# Patient Record
Sex: Female | Born: 1945
Health system: Southern US, Community
[De-identification: ages and names within clinical notes are randomized; demographics above are authoritative.]

## PROBLEM LIST (undated history)

## (undated) DIAGNOSIS — R059 Cough, unspecified: Secondary | ICD-10-CM

## (undated) DIAGNOSIS — R05 Cough: Secondary | ICD-10-CM

## (undated) DIAGNOSIS — I4729 Other ventricular tachycardia: Secondary | ICD-10-CM

## (undated) DIAGNOSIS — I472 Ventricular tachycardia, unspecified: Secondary | ICD-10-CM

## (undated) DIAGNOSIS — I2510 Atherosclerotic heart disease of native coronary artery without angina pectoris: Secondary | ICD-10-CM

## (undated) DIAGNOSIS — G4730 Sleep apnea, unspecified: Secondary | ICD-10-CM

## (undated) DIAGNOSIS — N3010 Interstitial cystitis (chronic) without hematuria: Secondary | ICD-10-CM

## (undated) DIAGNOSIS — E781 Pure hyperglyceridemia: Secondary | ICD-10-CM

## (undated) DIAGNOSIS — G4733 Obstructive sleep apnea (adult) (pediatric): Secondary | ICD-10-CM

## (undated) DIAGNOSIS — T7840XA Allergy, unspecified, initial encounter: Secondary | ICD-10-CM

## (undated) DIAGNOSIS — J45909 Unspecified asthma, uncomplicated: Secondary | ICD-10-CM

## (undated) DIAGNOSIS — K219 Gastro-esophageal reflux disease without esophagitis: Secondary | ICD-10-CM

## (undated) DIAGNOSIS — R569 Unspecified convulsions: Secondary | ICD-10-CM

## (undated) DIAGNOSIS — E039 Hypothyroidism, unspecified: Secondary | ICD-10-CM

## (undated) DIAGNOSIS — Z87898 Personal history of other specified conditions: Secondary | ICD-10-CM

## (undated) DIAGNOSIS — M1990 Unspecified osteoarthritis, unspecified site: Secondary | ICD-10-CM

## (undated) DIAGNOSIS — K589 Irritable bowel syndrome without diarrhea: Secondary | ICD-10-CM

## (undated) DIAGNOSIS — I10 Essential (primary) hypertension: Secondary | ICD-10-CM

## (undated) DIAGNOSIS — E785 Hyperlipidemia, unspecified: Secondary | ICD-10-CM

## (undated) DIAGNOSIS — E119 Type 2 diabetes mellitus without complications: Secondary | ICD-10-CM

## (undated) HISTORY — DX: Unspecified asthma, uncomplicated: J45.909

## (undated) HISTORY — DX: Personal history of other specified conditions: Z87.898

## (undated) HISTORY — PX: FACIAL NERVE SURGERY: SHX630

## (undated) HISTORY — DX: Sleep apnea, unspecified: G47.30

## (undated) HISTORY — DX: Gastro-esophageal reflux disease without esophagitis: K21.9

## (undated) HISTORY — DX: Irritable bowel syndrome, unspecified: K58.9

## (undated) HISTORY — DX: Ventricular tachycardia: I47.2

## (undated) HISTORY — DX: Cough, unspecified: R05.9

## (undated) HISTORY — DX: Ventricular tachycardia, unspecified: I47.20

## (undated) HISTORY — DX: Interstitial cystitis (chronic) without hematuria: N30.10

## (undated) HISTORY — DX: Obstructive sleep apnea (adult) (pediatric): G47.33

## (undated) HISTORY — DX: Pure hyperglyceridemia: E78.1

## (undated) HISTORY — DX: Cough: R05

## (undated) HISTORY — DX: Allergy, unspecified, initial encounter: T78.40XA

## (undated) HISTORY — DX: Type 2 diabetes mellitus without complications: E11.9

## (undated) HISTORY — PX: COSMETIC SURGERY: SHX468

## (undated) HISTORY — DX: Other ventricular tachycardia: I47.29

## (undated) HISTORY — DX: Unspecified osteoarthritis, unspecified site: M19.90

## (undated) HISTORY — DX: Hypothyroidism, unspecified: E03.9

## (undated) HISTORY — DX: Hyperlipidemia, unspecified: E78.5

## (undated) HISTORY — DX: Atherosclerotic heart disease of native coronary artery without angina pectoris: I25.10

## (undated) HISTORY — DX: Unspecified convulsions: R56.9

## (undated) HISTORY — DX: Essential (primary) hypertension: I10

---

## 1979-11-16 HISTORY — PX: ABDOMINAL HYSTERECTOMY: SHX81

## 1998-04-24 ENCOUNTER — Other Ambulatory Visit: Admission: RE | Admit: 1998-04-24 | Discharge: 1998-04-24 | Payer: Self-pay | Admitting: *Deleted

## 1999-08-04 ENCOUNTER — Encounter: Admission: RE | Admit: 1999-08-04 | Discharge: 1999-11-02 | Payer: Self-pay | Admitting: *Deleted

## 1999-10-13 ENCOUNTER — Encounter: Payer: Self-pay | Admitting: *Deleted

## 1999-10-13 ENCOUNTER — Encounter: Admission: RE | Admit: 1999-10-13 | Discharge: 1999-10-13 | Payer: Self-pay | Admitting: *Deleted

## 2000-01-31 ENCOUNTER — Encounter: Payer: Self-pay | Admitting: *Deleted

## 2000-01-31 ENCOUNTER — Encounter: Admission: RE | Admit: 2000-01-31 | Discharge: 2000-01-31 | Payer: Self-pay | Admitting: *Deleted

## 2000-07-03 ENCOUNTER — Encounter: Admission: RE | Admit: 2000-07-03 | Discharge: 2000-07-03 | Payer: Self-pay | Admitting: *Deleted

## 2000-07-03 ENCOUNTER — Encounter: Payer: Self-pay | Admitting: *Deleted

## 2000-07-18 HISTORY — PX: CHOLECYSTECTOMY: SHX55

## 2000-07-20 ENCOUNTER — Encounter: Payer: Self-pay | Admitting: General Surgery

## 2000-07-21 ENCOUNTER — Encounter (INDEPENDENT_AMBULATORY_CARE_PROVIDER_SITE_OTHER): Payer: Self-pay | Admitting: Specialist

## 2000-07-21 ENCOUNTER — Observation Stay (HOSPITAL_COMMUNITY): Admission: RE | Admit: 2000-07-21 | Discharge: 2000-07-22 | Payer: Self-pay | Admitting: General Surgery

## 2000-08-21 ENCOUNTER — Encounter: Admission: RE | Admit: 2000-08-21 | Discharge: 2000-11-19 | Payer: Self-pay | Admitting: *Deleted

## 2000-11-27 ENCOUNTER — Ambulatory Visit (HOSPITAL_COMMUNITY): Admission: RE | Admit: 2000-11-27 | Discharge: 2000-11-27 | Payer: Self-pay | Admitting: Gastroenterology

## 2001-02-14 ENCOUNTER — Encounter: Admission: RE | Admit: 2001-02-14 | Discharge: 2001-02-14 | Payer: Self-pay | Admitting: *Deleted

## 2001-02-14 ENCOUNTER — Encounter: Payer: Self-pay | Admitting: *Deleted

## 2001-09-18 ENCOUNTER — Other Ambulatory Visit: Admission: RE | Admit: 2001-09-18 | Discharge: 2001-09-18 | Payer: Self-pay | Admitting: *Deleted

## 2001-12-17 ENCOUNTER — Ambulatory Visit (HOSPITAL_COMMUNITY): Admission: RE | Admit: 2001-12-17 | Discharge: 2001-12-17 | Payer: Self-pay | Admitting: Cardiology

## 2002-02-26 ENCOUNTER — Encounter: Admission: RE | Admit: 2002-02-26 | Discharge: 2002-02-26 | Payer: Self-pay | Admitting: *Deleted

## 2002-02-26 ENCOUNTER — Encounter: Payer: Self-pay | Admitting: *Deleted

## 2002-04-05 ENCOUNTER — Ambulatory Visit (HOSPITAL_COMMUNITY): Admission: RE | Admit: 2002-04-05 | Discharge: 2002-04-05 | Payer: Self-pay | Admitting: Internal Medicine

## 2002-05-13 ENCOUNTER — Other Ambulatory Visit: Admission: RE | Admit: 2002-05-13 | Discharge: 2002-05-13 | Payer: Self-pay | Admitting: *Deleted

## 2002-06-06 ENCOUNTER — Ambulatory Visit (HOSPITAL_COMMUNITY): Admission: RE | Admit: 2002-06-06 | Discharge: 2002-06-06 | Payer: Self-pay | Admitting: *Deleted

## 2002-09-23 ENCOUNTER — Encounter: Payer: Self-pay | Admitting: Family Medicine

## 2002-09-23 ENCOUNTER — Encounter: Admission: RE | Admit: 2002-09-23 | Discharge: 2002-09-23 | Payer: Self-pay | Admitting: Family Medicine

## 2002-10-14 ENCOUNTER — Other Ambulatory Visit: Admission: RE | Admit: 2002-10-14 | Discharge: 2002-10-14 | Payer: Self-pay | Admitting: *Deleted

## 2002-10-18 ENCOUNTER — Encounter: Admission: RE | Admit: 2002-10-18 | Discharge: 2002-10-18 | Payer: Self-pay | Admitting: Family Medicine

## 2002-10-18 ENCOUNTER — Encounter: Payer: Self-pay | Admitting: Family Medicine

## 2003-03-05 ENCOUNTER — Encounter: Payer: Self-pay | Admitting: Family Medicine

## 2003-03-05 ENCOUNTER — Encounter: Admission: RE | Admit: 2003-03-05 | Discharge: 2003-03-05 | Payer: Self-pay | Admitting: Family Medicine

## 2003-03-28 ENCOUNTER — Encounter: Payer: Self-pay | Admitting: Gastroenterology

## 2003-03-28 ENCOUNTER — Encounter: Admission: RE | Admit: 2003-03-28 | Discharge: 2003-03-28 | Payer: Self-pay | Admitting: Gastroenterology

## 2003-04-02 ENCOUNTER — Ambulatory Visit (HOSPITAL_COMMUNITY): Admission: RE | Admit: 2003-04-02 | Discharge: 2003-04-02 | Payer: Self-pay | Admitting: Gastroenterology

## 2003-04-15 ENCOUNTER — Ambulatory Visit (HOSPITAL_COMMUNITY): Admission: RE | Admit: 2003-04-15 | Discharge: 2003-04-15 | Payer: Self-pay | Admitting: Gastroenterology

## 2003-04-15 ENCOUNTER — Encounter: Payer: Self-pay | Admitting: Gastroenterology

## 2003-05-08 ENCOUNTER — Ambulatory Visit (HOSPITAL_COMMUNITY): Admission: RE | Admit: 2003-05-08 | Discharge: 2003-05-08 | Payer: Self-pay | Admitting: Gastroenterology

## 2003-05-08 ENCOUNTER — Encounter: Payer: Self-pay | Admitting: Gastroenterology

## 2004-01-26 ENCOUNTER — Encounter: Payer: Self-pay | Admitting: Internal Medicine

## 2004-01-30 ENCOUNTER — Ambulatory Visit (HOSPITAL_BASED_OUTPATIENT_CLINIC_OR_DEPARTMENT_OTHER): Admission: RE | Admit: 2004-01-30 | Discharge: 2004-01-30 | Payer: Self-pay | Admitting: Pulmonary Disease

## 2004-01-30 ENCOUNTER — Encounter: Payer: Self-pay | Admitting: Pulmonary Disease

## 2004-04-16 ENCOUNTER — Encounter: Admission: RE | Admit: 2004-04-16 | Discharge: 2004-04-16 | Payer: Self-pay | Admitting: Family Medicine

## 2004-04-27 ENCOUNTER — Ambulatory Visit (HOSPITAL_COMMUNITY): Admission: RE | Admit: 2004-04-27 | Discharge: 2004-04-27 | Payer: Self-pay | Admitting: Gastroenterology

## 2004-04-30 ENCOUNTER — Ambulatory Visit (HOSPITAL_COMMUNITY): Admission: RE | Admit: 2004-04-30 | Discharge: 2004-04-30 | Payer: Self-pay | Admitting: Gastroenterology

## 2004-05-18 ENCOUNTER — Ambulatory Visit (HOSPITAL_COMMUNITY): Admission: RE | Admit: 2004-05-18 | Discharge: 2004-05-18 | Payer: Self-pay

## 2004-06-30 ENCOUNTER — Ambulatory Visit: Payer: Self-pay | Admitting: Pulmonary Disease

## 2004-07-07 ENCOUNTER — Ambulatory Visit (HOSPITAL_COMMUNITY): Admission: RE | Admit: 2004-07-07 | Discharge: 2004-07-07 | Payer: Self-pay | Admitting: Gastroenterology

## 2004-07-27 ENCOUNTER — Ambulatory Visit (HOSPITAL_COMMUNITY): Admission: RE | Admit: 2004-07-27 | Discharge: 2004-07-27 | Payer: Self-pay | Admitting: *Deleted

## 2004-10-18 ENCOUNTER — Encounter: Admission: RE | Admit: 2004-10-18 | Discharge: 2005-01-16 | Payer: Self-pay | Admitting: Family Medicine

## 2005-01-29 ENCOUNTER — Encounter: Admission: RE | Admit: 2005-01-29 | Discharge: 2005-01-29 | Payer: Self-pay | Admitting: Family Medicine

## 2005-04-07 ENCOUNTER — Encounter: Admission: RE | Admit: 2005-04-07 | Discharge: 2005-07-06 | Payer: Self-pay | Admitting: Family Medicine

## 2005-04-20 ENCOUNTER — Ambulatory Visit (HOSPITAL_COMMUNITY): Admission: RE | Admit: 2005-04-20 | Discharge: 2005-04-20 | Payer: Self-pay | Admitting: Family Medicine

## 2005-07-18 DIAGNOSIS — Z87898 Personal history of other specified conditions: Secondary | ICD-10-CM

## 2005-07-18 HISTORY — DX: Personal history of other specified conditions: Z87.898

## 2005-09-23 ENCOUNTER — Ambulatory Visit (HOSPITAL_COMMUNITY): Admission: RE | Admit: 2005-09-23 | Discharge: 2005-09-23 | Payer: Self-pay | Admitting: *Deleted

## 2005-12-15 ENCOUNTER — Encounter: Admission: RE | Admit: 2005-12-15 | Discharge: 2005-12-15 | Payer: Self-pay | Admitting: Family Medicine

## 2005-12-16 ENCOUNTER — Encounter: Admission: RE | Admit: 2005-12-16 | Discharge: 2005-12-16 | Payer: Self-pay | Admitting: Family Medicine

## 2006-05-16 ENCOUNTER — Encounter: Admission: RE | Admit: 2006-05-16 | Discharge: 2006-05-16 | Payer: Self-pay | Admitting: Family Medicine

## 2006-10-05 ENCOUNTER — Ambulatory Visit (HOSPITAL_BASED_OUTPATIENT_CLINIC_OR_DEPARTMENT_OTHER): Admission: RE | Admit: 2006-10-05 | Discharge: 2006-10-05 | Payer: Self-pay | Admitting: Urology

## 2007-05-07 ENCOUNTER — Ambulatory Visit: Payer: Self-pay | Admitting: Pulmonary Disease

## 2007-05-21 ENCOUNTER — Encounter: Admission: RE | Admit: 2007-05-21 | Discharge: 2007-05-21 | Payer: Self-pay | Admitting: Family Medicine

## 2007-05-25 ENCOUNTER — Ambulatory Visit: Payer: Self-pay | Admitting: Pulmonary Disease

## 2007-06-05 DIAGNOSIS — G4733 Obstructive sleep apnea (adult) (pediatric): Secondary | ICD-10-CM | POA: Insufficient documentation

## 2007-06-05 DIAGNOSIS — T7840XA Allergy, unspecified, initial encounter: Secondary | ICD-10-CM | POA: Insufficient documentation

## 2007-06-05 DIAGNOSIS — E1159 Type 2 diabetes mellitus with other circulatory complications: Secondary | ICD-10-CM | POA: Insufficient documentation

## 2007-06-05 DIAGNOSIS — I472 Ventricular tachycardia: Secondary | ICD-10-CM | POA: Insufficient documentation

## 2007-06-05 DIAGNOSIS — K589 Irritable bowel syndrome without diarrhea: Secondary | ICD-10-CM | POA: Insufficient documentation

## 2007-06-05 DIAGNOSIS — I4729 Other ventricular tachycardia: Secondary | ICD-10-CM | POA: Insufficient documentation

## 2007-06-05 DIAGNOSIS — E039 Hypothyroidism, unspecified: Secondary | ICD-10-CM | POA: Insufficient documentation

## 2007-06-05 DIAGNOSIS — E119 Type 2 diabetes mellitus without complications: Secondary | ICD-10-CM | POA: Insufficient documentation

## 2007-06-05 DIAGNOSIS — J45909 Unspecified asthma, uncomplicated: Secondary | ICD-10-CM | POA: Insufficient documentation

## 2007-06-05 DIAGNOSIS — K219 Gastro-esophageal reflux disease without esophagitis: Secondary | ICD-10-CM | POA: Insufficient documentation

## 2007-06-05 DIAGNOSIS — N3010 Interstitial cystitis (chronic) without hematuria: Secondary | ICD-10-CM | POA: Insufficient documentation

## 2007-07-02 ENCOUNTER — Ambulatory Visit: Payer: Self-pay | Admitting: Pulmonary Disease

## 2007-07-02 DIAGNOSIS — R059 Cough, unspecified: Secondary | ICD-10-CM | POA: Insufficient documentation

## 2007-07-02 DIAGNOSIS — R05 Cough: Secondary | ICD-10-CM

## 2007-07-16 ENCOUNTER — Encounter: Payer: Self-pay | Admitting: Pulmonary Disease

## 2007-11-08 ENCOUNTER — Ambulatory Visit: Payer: Self-pay | Admitting: Pulmonary Disease

## 2007-11-08 DIAGNOSIS — J383 Other diseases of vocal cords: Secondary | ICD-10-CM | POA: Insufficient documentation

## 2007-12-17 ENCOUNTER — Encounter: Payer: Self-pay | Admitting: Pulmonary Disease

## 2008-01-09 ENCOUNTER — Encounter: Admission: RE | Admit: 2008-01-09 | Discharge: 2008-01-09 | Payer: Self-pay | Admitting: Family Medicine

## 2008-01-10 ENCOUNTER — Encounter: Payer: Self-pay | Admitting: Pulmonary Disease

## 2008-01-15 ENCOUNTER — Telehealth (INDEPENDENT_AMBULATORY_CARE_PROVIDER_SITE_OTHER): Payer: Self-pay | Admitting: *Deleted

## 2008-01-31 ENCOUNTER — Encounter: Payer: Self-pay | Admitting: Pulmonary Disease

## 2008-04-14 ENCOUNTER — Encounter: Payer: Self-pay | Admitting: Pulmonary Disease

## 2008-05-22 ENCOUNTER — Encounter: Admission: RE | Admit: 2008-05-22 | Discharge: 2008-05-22 | Payer: Self-pay | Admitting: Family Medicine

## 2008-07-18 HISTORY — PX: ESOPHAGOGASTRODUODENOSCOPY ENDOSCOPY: SHX5814

## 2008-08-21 ENCOUNTER — Encounter: Payer: Self-pay | Admitting: Pulmonary Disease

## 2009-07-22 ENCOUNTER — Encounter: Admission: RE | Admit: 2009-07-22 | Discharge: 2009-07-22 | Payer: Self-pay | Admitting: Family Medicine

## 2009-09-10 ENCOUNTER — Encounter: Payer: Self-pay | Admitting: Pulmonary Disease

## 2010-08-17 NOTE — Letter (Signed)
Summary: [facility]  [id number]   Imported By: Lanelle Bal 10/12/2009 14:16:10  _____________________________________________________________________  External Attachment:    Type:   Image     Comment:   External Document

## 2010-09-21 ENCOUNTER — Encounter: Payer: Self-pay | Admitting: Family Medicine

## 2010-10-07 ENCOUNTER — Other Ambulatory Visit: Payer: Self-pay | Admitting: Family Medicine

## 2010-10-07 DIAGNOSIS — Z1231 Encounter for screening mammogram for malignant neoplasm of breast: Secondary | ICD-10-CM

## 2010-10-11 ENCOUNTER — Ambulatory Visit
Admission: RE | Admit: 2010-10-11 | Discharge: 2010-10-11 | Disposition: A | Discharge status: Home / Self Care | Payer: BC Managed Care – PPO | Source: Ambulatory Visit | Attending: Family Medicine | Admitting: Family Medicine

## 2010-10-11 DIAGNOSIS — Z1231 Encounter for screening mammogram for malignant neoplasm of breast: Secondary | ICD-10-CM

## 2010-10-15 ENCOUNTER — Ambulatory Visit: Payer: Self-pay | Admitting: Family Medicine

## 2010-10-21 LAB — LIPID PANEL
Cholesterol: 154 mg/dL (ref 0–200)
HDL: 64 mg/dL (ref 35–70)
Triglycerides: 171 mg/dL — AB (ref 40–160)

## 2010-10-21 LAB — BASIC METABOLIC PANEL: Creatinine: 1 mg/dL (ref 0.5–1.1)

## 2010-10-21 LAB — TSH: TSH: 3.38 u[IU]/mL (ref 0.41–5.90)

## 2010-10-21 LAB — HEPATIC FUNCTION PANEL
ALT: 29 U/L (ref 7–35)
Alkaline Phosphatase: 55 U/L (ref 25–125)

## 2010-11-08 ENCOUNTER — Encounter: Payer: Self-pay | Admitting: Family Medicine

## 2010-11-08 ENCOUNTER — Ambulatory Visit (INDEPENDENT_AMBULATORY_CARE_PROVIDER_SITE_OTHER): Payer: BC Managed Care – PPO | Admitting: Family Medicine

## 2010-11-08 ENCOUNTER — Encounter: Payer: Self-pay | Admitting: *Deleted

## 2010-11-08 VITALS — BP 130/80 | HR 72 | Temp 97.9°F | Ht 63.25 in | Wt 145.8 lb

## 2010-11-08 DIAGNOSIS — E039 Hypothyroidism, unspecified: Secondary | ICD-10-CM

## 2010-11-08 DIAGNOSIS — I4729 Other ventricular tachycardia: Secondary | ICD-10-CM

## 2010-11-08 DIAGNOSIS — I472 Ventricular tachycardia: Secondary | ICD-10-CM

## 2010-11-08 DIAGNOSIS — J383 Other diseases of vocal cords: Secondary | ICD-10-CM | POA: Insufficient documentation

## 2010-11-08 DIAGNOSIS — E119 Type 2 diabetes mellitus without complications: Secondary | ICD-10-CM

## 2010-11-08 DIAGNOSIS — J385 Laryngeal spasm: Secondary | ICD-10-CM | POA: Insufficient documentation

## 2010-11-08 DIAGNOSIS — E7800 Pure hypercholesterolemia, unspecified: Secondary | ICD-10-CM

## 2010-11-08 MED ORDER — ESTRADIOL 0.1 MG/GM VA CREA: 1.0000 g | TOPICAL_CREAM | VAGINAL | Status: DC

## 2010-11-08 NOTE — Patient Instructions (Signed)
I was nice meeting you today.  Don't forget to stop by front desk to drop of complete medical records requests.

## 2010-11-08 NOTE — Progress Notes (Signed)
Subjective:    Patient ID: Valerie Vargas, female    DOB: 1946/02/24, 65 y.o.   MRN: [id number]  HPI  65 year old female here to establish.  Previous MD Dr. Raquel James.  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  She was very healthy until 2001... fatigue, SOB, palpitations, syncope, chocking. Saw Pulmonologist, Cardiologist. Dx Asthma, GERD.. But meds did not help. Stress test showed tachycardia... Started on toprol. Saw Dr Marcos Eke...dx then with sleep apnea. Improved some. Saw alternative med DR.: Dr. Ananias Pilgrim. Dx with vit D and other vit deficiency... Felt much better.  Also referred to [facility]  (Dr. Carlye Grippe dx with larynx swelling....coughing proceeds attacks. She is on gabapentin to prevents this... Can take more if having an episode. Last episode 1 week ago... But usually every few months. Unknown trigger.     DM: Last A1C was  6.1 on 4/5.  Followed by Dr. Talmage Nap.  Last check there was 5.7 On metformin... Told she was doing so well she was able to try to taper down off metformin.  Last cholesterol 4/5: total 154, HDL 64, LDL 54, tri 171.  On crestor 10 mg daily. No myalgia.  Has Dr. Royann Shivers cardiologist.  Sleep apnea:on CPAP.  Hypothyroid, well controlled: Followed by Dr. Talmage Nap.   Interstitial cystitis well controlled: Dr. Perley Jain. Atrophic vaginitis...needs refill of estrace cream.   LAST CPX: 02/17/2010    Review of Systems  Constitutional: Negative for fever, fatigue and unexpected weight change.  HENT: Negative for ear pain, congestion, sore throat, sneezing, trouble swallowing and sinus pressure.   Eyes: Negative for pain and itching.  Respiratory: Negative for cough, shortness of breath and wheezing.   Cardiovascular: Negative for chest pain, palpitations and leg swelling.  Gastrointestinal: Negative for nausea, abdominal pain, diarrhea, constipation and blood in stool.  Genitourinary: Negative for dysuria, hematuria, vaginal  discharge, difficulty urinating and menstrual problem.  Skin: Negative for rash.  Neurological: Negative for syncope, weakness, light-headedness, numbness and headaches.  Psychiatric/Behavioral: Negative for confusion and dysphoric mood. The patient is not nervous/anxious.        Objective:   Physical Exam  Constitutional: Vital signs are normal. She appears well-developed and well-nourished. She is cooperative.  Non-toxic appearance. She does not appear ill. No distress.  HENT:  Head: Normocephalic.  Right Ear: Hearing, tympanic membrane, external ear and ear canal normal.  Left Ear: Hearing, tympanic membrane, external ear and ear canal normal.  Nose: Nose normal.  Eyes: Conjunctivae, EOM and lids are normal. Pupils are equal, round, and reactive to light. No foreign bodies found.  Neck: Trachea normal and normal range of motion. Neck supple. Carotid bruit is not present. No mass and no thyromegaly present.  Cardiovascular: Normal rate, regular rhythm, S1 normal, S2 normal, normal heart sounds and intact distal pulses.  Exam reveals no gallop.   No murmur heard. Pulmonary/Chest: Effort normal and breath sounds normal. No respiratory distress. She has no wheezes. She has no rhonchi. She has no rales.  Abdominal: Soft. Normal appearance and bowel sounds are normal. She exhibits no distension, no fluid wave, no abdominal bruit and no mass. There is no hepatosplenomegaly. There is no tenderness. There is no rebound, no guarding and no CVA tenderness. No hernia.  Genitourinary: Pelvic exam was performed with patient prone. Uterus is not enlarged and not tender. Cervix exhibits no discharge and no friability. Right adnexum displays no mass, no tenderness and no fullness. Left adnexum displays no mass, no tenderness and  no fullness.  Lymphadenopathy:    She has no cervical adenopathy.    She has no axillary adenopathy.  Neurological: She is alert. She has normal strength. No cranial nerve deficit  or sensory deficit.  Skin: Skin is warm, dry and intact. No rash noted.  Psychiatric: Her speech is normal and behavior is normal. Judgment normal. Her mood appears not anxious. Cognition and memory are normal. She does not exhibit a depressed mood.          Assessment & Plan:

## 2010-11-10 ENCOUNTER — Encounter: Payer: Self-pay | Admitting: Family Medicine

## 2010-11-17 DIAGNOSIS — E1169 Type 2 diabetes mellitus with other specified complication: Secondary | ICD-10-CM | POA: Insufficient documentation

## 2010-11-17 DIAGNOSIS — E7800 Pure hypercholesterolemia, unspecified: Secondary | ICD-10-CM | POA: Insufficient documentation

## 2010-11-17 NOTE — Assessment & Plan Note (Signed)
Stable per Dr. Talmage Nap.

## 2010-11-17 NOTE — Assessment & Plan Note (Signed)
Last A1C was 6.1 on 4/5.  Followed by Dr. Talmage Nap. Last check there was 5.7  On metformin... Told she was doing so well she was able to try to taper down off metformin..she has not done this yet.

## 2010-11-17 NOTE — Assessment & Plan Note (Signed)
Followed by cardiology 

## 2010-11-17 NOTE — Assessment & Plan Note (Signed)
Due to vocal corddysfunction. Followed at [facility]. Has upcoming ? Endo study to reevaluate. She is going to discuss with her other doctors whether allergy treatment or testing is needed. Will let me know.

## 2010-11-17 NOTE — Assessment & Plan Note (Signed)
Last cholesterol 4/5: total 154, HDL 64, LDL 54, tri 171. On crestor 10 mg daily. No myalgia.

## 2010-11-23 ENCOUNTER — Encounter: Payer: Self-pay | Admitting: Family Medicine

## 2010-11-29 ENCOUNTER — Encounter: Payer: Self-pay | Admitting: Family Medicine

## 2010-11-30 ENCOUNTER — Encounter: Payer: Self-pay | Admitting: Family Medicine

## 2010-12-03 NOTE — Procedures (Signed)
[facility]  Patient:    Valerie Vargas, Valerie Vargas                      MRN: [id number] Proc. Date: 11/27/00 Adm. Date:  [id number] Attending:  Louie Bun CC:         Heather Roberts, M.D.   Procedure Report  PROCEDURE:  Colonoscopy.  GASTROENTEROLOGIST:  Everardo All. Madilyn Fireman, M.D.  INDICATIONS FOR PROCEDURE:  Right lower quadrant abdominal pain, weight loss, and alternating constipation and diarrhea in a 65 year old patient with no prior colon screening.  DESCRIPTION OF PROCEDURE:   The patient was placed in the left lateral decubitus position and placed on the pulse monitor with continuous low-flow oxygen delivered by nasal cannula.  She was sedated with 50 mg IV Demerol and 6 mg IV Versed.  The Olympus video colonoscope was inserted into the rectum and advanced to the cecum, confirmed by transillumination of McBurneys point and visualization of the ileocecal valve and appendiceal orifice.  The prep was excellent.  The cecum, ascending, transverse, descending, and sigmoid colon all appeared normal with no masses, polyps, diverticula, or other mucosal abnormalities.  The rectum likewise appeared normal on retroflexed view.  The anus revealed small internal hemorrhoids.  The colonoscope was then withdrawn, and the patient returned to the recovery room in stable condition. She tolerated the procedure well, and there were no immediate complications.  IMPRESSIONS:  Internal hemorrhoids, otherwise normal colonoscopy.  PLAN:  Consider trial of fiber supplement and, if pain continues, an antispasmodic or review if she has had recent gynecologic exam. DD:  11/27/00 TD:  11/27/00 Job: [id number]

## 2010-12-03 NOTE — Op Note (Signed)
   NAME:  Valerie Vargas, Valerie Vargas                         ACCOUNT NO.:  [id number]   MEDICAL RECORD NO.:  [id number]                   PATIENT TYPE:  AMB   LOCATION:  ENDO                                 FACILITY:  [facility]   PHYSICIAN:  Anselmo Rod, M.D.               DATE OF BIRTH:  12-Jan-1946   DATE OF PROCEDURE:  04/02/2003  DATE OF DISCHARGE:                                 OPERATIVE REPORT   PROCEDURE PERFORMED:  Esophagogastroduodenoscopy.   ENDOSCOPIST:  Anselmo Rod, M.D.   INSTRUMENT USED:  Olympus video panendoscope.   INDICATION FOR PROCEDURE:  [age]-year-old white female with a history  of epigastric pain and reflux and a family history of stomach cancer,  undergoing an EGD to rule out peptic ulcer disease, esophagitis, gastritis,  etc.   PRE-PROCEDURE PREPARATION:  Informed consent was procured from the patient.  The patient was fasted for eight hours prior to the procedure.   PRE-PROCEDURE PHYSICAL:  VITAL SIGNS:  The patient had stable vital signs.  NECK:  Neck supple.  CHEST:  Chest clear to auscultation.  S1 and S2 regular.  ABDOMEN:  Abdomen soft with normal bowel sounds.   DESCRIPTION OF THE PROCEDURE:  The patient was placed in the left lateral  decubitus position and sedated with 50 mg of Demerol and 5 mg of Versed  intravenously.  Once the patient was adequately sedate and maintained on low-  flow oxygen and continuous cardiac monitoring, the Olympus video  panendoscope was advanced through the mouthpiece, over the tongue, into the  esophagus under direct vision.  The entire esophagus appeared normal with no  evidence of ring, stricture, mass, esophagitis or Barrett's mucosa.  The  scope was then advanced into the stomach; the entire gastric mucosa appeared  normal and so did the retroflexed view in the high cardia.  The proximal  small bowel was normal as well.   IMPRESSION:  Normal esophagogastroduodenoscopy.   RECOMMENDATIONS:  1. Continue  Protonix.  2.     Follow antireflux measures.  3. Avoid nonsteroidals.  4. Outpatient followup in the next two weeks or earlier if need be.                                               Anselmo Rod, M.D.    JNM/MEDQ  D:  04/02/2003  T:  04/02/2003  Job:  [id number]   cc:   Christella Noa, M.D.  [address]  [place], [place] [address]  Fax: [phone]

## 2010-12-03 NOTE — Cardiovascular Report (Signed)
[name], Valerie Vargas               ACCOUNT NO.:  [id number]   MEDICAL RECORD NO.:  [id number]          PATIENT TYPE:  OIB   LOCATION:  2899                         FACILITY:  [facility]   PHYSICIAN:  Darlin Priestly, MD  DATE OF BIRTH:  1946-07-06   DATE OF PROCEDURE:  09/23/2005  DATE OF DISCHARGE:  09/23/2005                              CARDIAC CATHETERIZATION   PROCEDURE:  Tilt table test.   [name] is a 65 year old female with a  history of recurrent presyncope  and syncopal episodes. These have occurred in the setting of possible  orthostatic changes. She is now referred for tilt table testing to rule out  neurocardiogenic syncope.   DESCRIPTION OF PROCEDURE:  After giving informed consent the patient is  brought to the cardiac cath lab in a fasting state. The patient was then  placed in a supine position and hemodynamic measurements were obtained.  Resting  blood pressure was 144/64 with a resting heart rate of 77. She was  monitored for approximately 5 minutes. She was then tilted to a 70-degree  heads-up position which was maintained for 45 minutes. The patient had  intermittent sensation of feeling tired and feeling like she had presyncopal  symptoms, however she had no significant change in her heart rate or blood  pressure. After approximately 50 minutes she was returned to a supine  position with no significant change in her blood pressure or heart rate. She  had no syncopal episodes. At this point, the test was terminated. The  patient was then transferred to the recovery room in stable condition.   CONCLUSION:  Negative heads-up tilt table testing.      Darlin Priestly, MD  Electronically Signed     RHM/MEDQ  D:  09/23/2005  T:  09/23/2005  Job:  [id number]

## 2010-12-03 NOTE — Op Note (Signed)
NAME:  Valerie Vargas, Valerie Vargas               ACCOUNT NO.:  [id number]   MEDICAL RECORD NO.:  [id number]          PATIENT TYPE:  AMB   LOCATION:  NESC                         FACILITY:  [facility]   PHYSICIAN:  Martina Sinner, MD DATE OF BIRTH:  Jun 16, 1946   DATE OF PROCEDURE:  10/05/2006  DATE OF DISCHARGE:                               OPERATIVE REPORT   PREOP DIAGNOSIS:  Pelvic pain.   POSTOP DIAGNOSIS:  Pelvic pain with urinary frequency; interstitial  cystitis.   NAME OF SURGERY:  Cystoscopy with bladder hydrodistention and bladder  installation therapy.   [name] has frequency, right lower quadrant pain and urodynamics  that are suspicious for a diagnosis of interstitial cystitis.   The patient is prepped and draped in the usual fashion.  I used a 78-  [place] scope for the examination.  The bladder mucosa and trigone were  normal.  There is no stitch, foreign body or carcinoma.  I  hydrodistended her to approximately 900 mL.  On re-examination of the  bladder there was glomerulations at 5 and 7 o'clock and some cephalad to  the trigone.  There is no ulcers.  The findings were mild though I do  believe that her bladder is inflamed and I will treat her as a low grade  interstitial cystitis with overactive bladder syndrome.   The patient was taken to the recovery room.  She was given preoperative  antibiotics.  We will start her education and treatment next week.           ______________________________  Martina Sinner, MD  Electronically Signed     SAM/MEDQ  D:  10/05/2006  T:  10/05/2006  Job:  [id number]

## 2010-12-03 NOTE — Op Note (Signed)
NAME:  Valerie Vargas, Valerie Vargas               ACCOUNT NO.:  [id number]   MEDICAL RECORD NO.:  [id number]          PATIENT TYPE:  AMB   LOCATION:  ENDO                         FACILITY:  [facility]   PHYSICIAN:  John C. Madilyn Fireman, M.D.    DATE OF BIRTH:  [birth year]   DATE OF PROCEDURE:  04/27/2004  DATE OF DISCHARGE:                                 OPERATIVE REPORT   PROCEDURE:  Colonoscopy.   INDICATIONS FOR PROCEDURE:  Right lower quadrant abdominal pain with  abnormal CT scan showing a 1.8 cm ascending colon mass.   PROCEDURE:   PROCEDURE:  The patient was placed in the left lateral decubitus position  and placed on the pulse monitor with continuous low flow oxygen delivered by  nasal cannula.  She was sedated with 62.5 mcg IV fentanyl and 6 mg IV  Versed.  The Olympus video colonoscope is inserted into the rectum and  advanced to the cecum, confirmed by transillumination at McBurney's point  and visualization of the ileocecal valve and appendiceal orifice.  Prep is  excellent.  The terminal ileum was intubated and explored for several  centimeters and appeared to be within normal limits.  The cecum appeared  normal.  Within the ascending colon, there was a smooth thecal polyp of  approximately 2 cm in diameter, which was freely mobile after lavage with  water.  No other abnormalities were seen in the ascending colon.  The  transverse, descending colon, sigmoid, and rectum likewise appeared normal  with no masses, polyps, diverticula, or other abnormalities.  The rectum  appeared normal, and retroflexed view of the anus did reveal some small  internal hemorrhoids.  The scope was then withdrawn, and the patient  returned to the recovery room in stable condition.  She tolerated the  procedure well, and there were no immediate complications.   IMPRESSION:  No ascending colon mass and generally normal colonoscopy with  the exception of small internal hemorrhoids.   PLAN:  Due to her continued  right-sided abdominal pain, we will obtain a  small bowel series to complete her workup.   PLAN:  Repeat study in five years.     JCH/MEDQ  D:  04/27/2004  T:  04/27/2004  Job:  [id number]   cc:   Talmadge Coventry, M.D.  [address]  [place]  [place] [address]  Fax: [phone]

## 2010-12-03 NOTE — H&P (Signed)
[place]  Patient:    [name], Vargas Visit Number: [id number] MRN: [id number]          Service Type: CAT Location: [facility] 2899 12 Attending Physician:  [place], Peter Manning Dictated by:   Peter M. [place], M.D. Admit Date:  12/17/2001 Discharge Date: 12/17/2001   CC:         Heather Roberts, M.D.   History and Physical  DATE OF BIRTH: [birth year]  HISTORY OF PRESENT ILLNESS: [name] is a 65 year old white female, seen for evaluation of chest pain.  Her pain is predominantly mid chest, radiating to the mid thoracic back area.  This is described as a tightness or pressure. Her symptoms are worse when she first awakens in the morning, lasting three to four hours, and then will tend to resolve later in the day.  Her symptoms are worse with exertion.  She also has experienced symptoms of increased shortness of breath, which initially she felt was more allergy related.  She also complains of lack of stamina with exertion.  The patient does have a history of non-insulin dependent diabetes mellitus and hyperlipidemia.  On Dec 04, 2001 the patient underwent a stress Cardiolite study.  She was able to walk for five minutes and 30 seconds on the Bruce protocol.  She denied any chest pain at this point; however, she developed recurrent repetitive episodes of nonsustained ventricular tachycardia.  This tachycardia did resolve with recovery.  Her Cardiolite images were normal, with no significant perfusion defects and ejection fraction estimated at 65%.  At this point, however, the patient does have persistent symptomatology.  On further review of her history she also reports three syncopal episodes over the past year.  Given these factors and the fact that she has exercise-induced ventricular tachycardia it is recommended that she undergo cardiac catheterization.  PAST MEDICAL HISTORY:  1. Dyslipidemia.  2. Gastroesophageal reflux disease.  3. Seasonal allergies.  4. Diabetes mellitus, adult onset.  5. Hypothyroidism.  PAST SURGICAL HISTORY:  1. Previous cholecystectomy.  2. Previous hysterectomy.  ALLERGIES:  1. PENICILLIN.  2. CODEINE.  CURRENT MEDICATIONS:  1. Synthroid 0.088 mg q.d.  2. Premarin 0.625 mg q.d.  3. Albuterol inhaler q.d.  4. Rhinocort q.d.  5. Have also added baby aspirin q.d.  6. Toprol-XL 25 mg q.d.  SOCIAL HISTORY: The patient works in Clinical biochemist at [employer]. She is a widow.  She has no children.  She walks regularly.  She denies alcohol or tobacco use.  FAMILY HISTORY: Father died at age 22 with stomach cancer.  He also had a history of coronary disease, hypertension, and diabetes.  Mother is age 6 and in good health.  One brother died at age 36 of leukemia.  REVIEW OF SYSTEMS: Significant for some intermittent diarrhea.  The patient generally has not felt well recently.  She denies any weight loss, has had no edema, orthopnea, or PND.  All other Review Of Systems are negative.  PHYSICAL EXAMINATION:  GENERAL: The patient is a pleasant white female in no apparent distress.  VITAL SIGNS: Blood pressure is 118/56, pulse 86 and regular.  Weight is 140 pounds.  HEENT: Unremarkable.  NECK: She has no JVD or bruits.  No adenopathy or thyromegaly.  LUNGS: Clear.  CARDIAC: Examination is without gallops, murmurs, rubs, or clicks.  ABDOMEN: Soft, nontender.  She has no hepatosplenomegaly, masses, or bruits.  EXTREMITIES: Femoral and pedal pulses are 2+ and symmetric.  She has no  edema.  NEUROLOGIC: Examination nonfocal.  LABORATORY DATA: Resting ECG shows normal sinus rhythm, normal ECG.  Chest x-ray showed no active disease.  IMPRESSION:  1. Symptoms of chest pain and fatigue.  2. Exercise-induced ventricular tachycardia.  3. History of syncope.  4. Dyslipidemia.  5. Adult onset diabetes mellitus.  6. Hypothyroidism.  PLAN: The patient is being admitted  for cardiac catheterization.  She was initiated on beta-blocker therapy for her ventricular tachycardia.  If her cardiac catheterization is negative may need to consider referral for EP evaluation. Dictated by:   Peter M. [place], M.D. Attending Physician:  [place], Peter Manning DD:  12/14/01 TD:  12/15/01 Job: [id number]

## 2010-12-03 NOTE — Procedures (Signed)
NAME:  Valerie Vargas, Valerie Vargas             ACCOUNT NO.:  [id number]   MEDICAL RECORD NO.:  [id number]          PATIENT TYPE:  OUT   LOCATION:  SLEEP CENTER                 FACILITY:  [facility]   PHYSICIAN:  Marcelyn Bruins, M.D. [facility] DATE OF BIRTH:  08-08-1945   DATE OF ADMISSION:  01/30/2004  DATE OF DISCHARGE:  01/30/2004                              NOCTURNAL POLYSOMNOGRAM   LOCATION:  Sleep Lab.   REFERRING PHYSICIAN:  Danice Goltz, M.D. [place]   INDICATIONS FOR THE STUDY:  Hypersomnia with sleep apnea.   SLEEP ARCHITECTURE:  Total sleep time was 418 minutes with adequate REM and  slow-wave sleep.  Sleep latency was normal at 11 minutes, and REM latency  was normal as well at 69 minutes.   IMPRESSION:  1. Mild obstructive sleep apnea, hypopnea syndrome, with mild O2     desaturation to 87%.  Events were more common in the supine position but     not limb related.  2. Loud snoring noted during the study.  3. Occasional PVC.  4. Moderate numbers of leg jerks with mild sleep disruption.                                   ______________________________                                Marcelyn Bruins, M.D. [facility]     KC/MEDQ  D:  02/03/2004 10:20:31  T:  02/03/2004 14:22:56  Job:  [id number]

## 2010-12-03 NOTE — Op Note (Signed)
[facility]  Patient:    Valerie Vargas                         MRN: [id number] Proc. Date: 07/21/00 Attending:  Chevis Pretty, M.D.                           Operative Report  PREOPERATIVE DIAGNOSIS:  Symptomatic cholelithiasis.  POSTOPERATIVE DIAGNOSIS:  Symptomatic cholelithiasis.  PROCEDURES:  Laparoscopic cholecystectomy.  SURGEON:  Chevis Pretty, M.D.  ASSISTANT:  Velora Heckler, M.D.  ANESTHESIA:  General endotracheal.  DESCRIPTION OF PROCEDURE:  After informed consent was obtained, the patient was brought to the operating room and placed in the supine position on the operating table.  After adequate induction of general anesthesia, the patients abdomen was prepped with Betadine and draped in the usual sterile manner.  A small transverse supraumbilical incision was made with a 15 blade knife.  This incision was carried down through the subcutaneous tissues using blunt dissection with the Kelly clamp and Army-Navy retractors until the linea alba was identified.  The linea alba was incised with the 15 blade knife and each side was grasped with Kocher clamps and elevated.  The preperitoneal space was then probed bluntly with the Kelly clamp until the peritoneum was bluntly opened, gaining access to the abdominal cavity.  A finger was inserted through this hole.  The anterior abdominal wall was palpated and there were no adhesions noted.  A 0 Vicryl pursestring stitch was then placed in the fascia around this hole.  A Hasson cannula was then placed through this hole into the abdominal cavity and anchored with the previously placed Vicryl pursestring stitch.  The abdomen was insufflated with carbon dioxide and a laparoscope was placed through the Hasson cannula and the dome of the gallbladder and liver edge were readily identifiable.  A small transverse upper midline incision was then made with a 15 blade knife.  Then a 10 mm port was placed through  this incision bluntly into the abdominal cavity under direct vision.  Two small incisions were made laterally on the right side of the abdomen below the costal angle, I believe at the costal margin, with a 15 blade knife and two 5 mm ports were placed through these incisions bluntly into the abdominal cavity under direct vision.  The blunt grafts were placed through the lateral most 5 mm port and used to grasp the dome of the gallbladder and elevate it anteriorly and superiorly over the liver edge.  Another blunt grasper was placed through the other lateral 5 mm port and used for retraction on the gallbladder neck.  A Maryland dissector was then placed through the upper midline port.  Using blunt dissection, the peritoneal resection overlying the gallbladder was opened and the fatty tissue cleared away.  The gallbladder neck/cystic duct junction was readily identified and this area was dissected bluntly circumferentially with the Maryland dissection and right angle dissector.  Once this area had been adequately visualized and identified, care was taken to keep the common duct medial to this dissection.  Three clips were then placed proximally on the cystic duct and one distally in the cystic duct, divided between the two.  The cystic artery was then readily identified posterior to this stricture and again dissected in a circumferential manner until it was easily identified and visualized.  Two clips were placed proximally and one distally on the cystic artery  and the artery was divided between the two with the laparoscopic scissors.  The gallbladder was then removed from the liver bed using sharp dissection with the laparoscopic hook electrocautery Bovie.  Prior to completely removing the gallbladder from the liver bed, the liver bed was inspected and several small bleeding points were coagulated with the Bovie electrocautery until the liver bed was hemostatic. The gallbladder was then  removed the rest of the way from the liver bed using the hook electrocautery.  A small hole was made in the gallbladder wall in the process of removing it from the liver bed.  At this point, the laparoscope was moved to the upper midline port and an endoscopic bag was placed through the Hasson cannula.  The gallbladder was placed within the bag.  The bag opening was then closed.  The gallbladder was removed with the bag through the umbilical port.  The Hasson cannula was then replaced through the umbilical hole and the abdomen was inspected.  The liver bed was evaluated and found to be hemostatic.  The abdomen was then irrigated with copious amounts of saline until the affluent was clear.  All of the ports were then removed under direct vision and were hemostatic.  The fascial hole at the umbilical port was closed with a previously placed Vicryl pursestring stitch.  All of the skin incisions were closed with interrupted Monocryl 4-0 Monocryl subcuticular stitches. Marcaine 0.25% was infiltrated into each of the incisions for postoperative anesthesia.  The patient tolerated the procedure well.  At the end of the case, all sponge, needle, and instrument counts were correct.  The wounds were covered with Benzoin and Steri-Strips.  The patient tolerated the procedure well.  The patient was then awakened and taken to the recovery room in stable condition. DD:  07/21/00 TD:  07/21/00 Job: [id number]

## 2010-12-03 NOTE — Cardiovascular Report (Signed)
[place]  Patient:    Valerie Vargas, Valerie Vargas Visit Number: [id number] MRN: [id number]          Service Type: CAT Location: [facility] 2899 [id number] Attending Physician:  [place], Peter Manning Dictated by:   Peter M. [place], M.D. Proc. Date: 12/17/01 Admit Date:  12/17/2001   CC:         Heather Roberts, M.D.   Cardiac Catheterization  INDICATIONS FOR PROCEDURE: The patient is a 65 year old female with exercise-induced ventricular tachycardia.  ACCESS: Via the right femoral artery using the standard Seldinger technique.  EQUIPMENT: The 6 French 4 cm right and left Judkins catheter, 6 French pigtail catheter, 6 French arterial sheath.  MEDICATIONS: Local anesthesia with 1% Xylocaine. Versed 2 mg IV.  CONTRAST: Omnipaque 110 cc.  HEMODYNAMIC DATA: Aortic pressure was 137/70 with a mean of 98 mmHg.  Left ventricular pressure is 143 with an EDP of 10 mmHg.  ANGIOGRAPHIC DATA: Left coronary artery: The left coronary artery arises and distributes normally.  Left main: The left main coronary artery is normal.  Left anterior descending: The left anterior descending artery has very minimal irregularities in the proximal vessel, less than 10%. The remainder of the vessel is normal.  There is a large intermediate vessel which is normal.  Left circumflex: The left circumflex coronary artery gives rise to three marginal branches and appears to be a normal vessel.  Right coronary artery: The right coronary artery arises and distributes normally. There is minor irregularities in the mid vessel, less than 10%.  LEFT VENTRICULAR ANGIOGRAPHY: Left ventricular angiography performed in the RAO view demonstrates normal left ventricular size and contractility. Ejection fraction is estimated at 70%. There is no mitral valve prolapse or regurgitation. The aortic valve appears normal. The proximal aorta appears normal.  FINAL INTERPRETATION: 1. No significant  atherosclerotic coronary artery disease. 2. Normal left ventricular function. Dictated by:   Peter M. [place], M.D. Attending Physician:  [place], Peter Manning DD:  12/17/01 TD:  12/18/01 Job: [id number]

## 2010-12-07 ENCOUNTER — Encounter: Payer: Self-pay | Admitting: Family Medicine

## 2011-02-01 ENCOUNTER — Encounter: Payer: Self-pay | Admitting: Family Medicine

## 2011-02-01 ENCOUNTER — Ambulatory Visit (INDEPENDENT_AMBULATORY_CARE_PROVIDER_SITE_OTHER): Payer: BC Managed Care – PPO | Admitting: Family Medicine

## 2011-02-01 DIAGNOSIS — J383 Other diseases of vocal cords: Secondary | ICD-10-CM

## 2011-02-01 DIAGNOSIS — R0602 Shortness of breath: Secondary | ICD-10-CM

## 2011-02-01 DIAGNOSIS — R5383 Other fatigue: Secondary | ICD-10-CM | POA: Insufficient documentation

## 2011-02-01 DIAGNOSIS — J45909 Unspecified asthma, uncomplicated: Secondary | ICD-10-CM

## 2011-02-01 DIAGNOSIS — R5381 Other malaise: Secondary | ICD-10-CM

## 2011-02-01 NOTE — Progress Notes (Signed)
Subjective:    Patient ID: Valerie Vargas, female    DOB: Jun 08, 1946, 65 y.o.   MRN: [id number]  HPI 65 year old very complicated pt with history including the following:  She was very healthy until 2001... fatigue, SOB, palpitations, syncope, choking.  Saw Pulmonologist, Cardiologist.  Dx Asthma, GERD.. But meds did not help.  (Singulair)  Stress test showed tachycardia... Started on toprol.  Saw Dr Marcos Eke...dx then with sleep apnea.  Improved some.   Saw alternative med DR.: Dr. Ananias Pilgrim. Dx with vit D and other vit deficiency... Felt much better.   Also referred to [facility] (Dr. Virginia Crews) where dx with larynx swelling (vocal cord dysfunction)....coughing proceeds attacks.  She is on gabapentin to prevents this... Can take more if having an episode.   Usually every few months. Unknown trigger.   Endoscopy in last few months was normal.  TODAY:  She comes to clinic reporting worsening of vocal cord dysfunction. Using gabapentin 300 mg in AM, mid day and 1-3 tabs at night.  Recently she has been  feeling  more fatigued in last month. She wakens feeling well, but gets fatigued as day goes on. She feels that she breaths shallowly.. She feels that this is cause of fatigue. Has been trying to use albuterol twice a day.... Has helped a little.  Saw Dr. Salena Saner cardiologist in interim. She did stress test repeat on 6/29.. nml results  Has not had pulmonary lung function tests since 2005   She has gained weight recently.    Review of Systems  Constitutional: Negative for fever and fatigue.  HENT: Negative for ear pain.   Eyes: Negative for pain.  Respiratory: Positive for cough, choking and shortness of breath. Negative for chest tightness.   Cardiovascular: Negative for chest pain, palpitations and leg swelling.  Gastrointestinal: Negative for abdominal pain.  Genitourinary: Negative for dysuria.       Objective:   Physical Exam  Constitutional: Vital signs are  normal. She appears well-developed and well-nourished. She is cooperative.  Non-toxic appearance. She does not appear ill. No distress.  HENT:  Head: Normocephalic.  Right Ear: Hearing, tympanic membrane, external ear and ear canal normal. Tympanic membrane is not erythematous, not retracted and not bulging.  Left Ear: Hearing, tympanic membrane, external ear and ear canal normal. Tympanic membrane is not erythematous, not retracted and not bulging.  Nose: No mucosal edema or rhinorrhea. Right sinus exhibits no maxillary sinus tenderness and no frontal sinus tenderness. Left sinus exhibits no maxillary sinus tenderness and no frontal sinus tenderness.  Mouth/Throat: Uvula is midline, oropharynx is clear and moist and mucous membranes are normal.  Eyes: Conjunctivae, EOM and lids are normal. Pupils are equal, round, and reactive to light. No foreign bodies found.  Neck: Trachea normal and normal range of motion. Neck supple. Carotid bruit is not present. No mass and no thyromegaly present.  Cardiovascular: Normal rate, regular rhythm, S1 normal, S2 normal, normal heart sounds, intact distal pulses and normal pulses.  Exam reveals no gallop and no friction rub.   No murmur heard. Pulmonary/Chest: Effort normal and breath sounds normal. Not tachypneic. No respiratory distress. She has no decreased breath sounds. She has no wheezes. She has no rhonchi. She has no rales.  Abdominal: Soft. Normal appearance and bowel sounds are normal. There is no tenderness.  Neurological: She is alert.  Skin: Skin is warm, dry and intact. No rash noted.  Psychiatric: Her speech is normal and behavior is normal. Judgment and thought  content normal. Her mood appears not anxious. Cognition and memory are normal. She does not exhibit a depressed mood.          Assessment & Plan:

## 2011-02-01 NOTE — Assessment & Plan Note (Signed)
Recurrence of past symptoms. Per pt seperate to feeling from vocal cord dysfunction.  Cardiac eval recently per Dr. Salena Saner showed... nml stress test. In past she was diagnosed with mild asthma (on 2005 PFTS) but did not notice improvement at that time and was not treated with other meds other than albuterol. I recommended repeat PFTs pre and post albuterol for reassessment of lung function.   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

## 2011-02-01 NOTE — Patient Instructions (Addendum)
Stop at front to schedule lung function tests with [facility]. Consider having thyroid, cbc, B12, complete metabolic panel with Dr. Talmage Nap or at work if not done recently. Dx [phone] We will call you with results of lung test.

## 2011-02-01 NOTE — Assessment & Plan Note (Signed)
Most likely medication side effect to recent increase in gabapentin due to worsening vocal cords dysfunction. But pt feels it is due to recent recurrence of feeling that she cannot get full breath. See below.  She will consider having labs done to eval B12, thyroid, cbc etc.

## 2011-02-07 ENCOUNTER — Ambulatory Visit (INDEPENDENT_AMBULATORY_CARE_PROVIDER_SITE_OTHER): Payer: BC Managed Care – PPO | Admitting: Internal Medicine

## 2011-02-07 DIAGNOSIS — R5383 Other fatigue: Secondary | ICD-10-CM

## 2011-02-07 DIAGNOSIS — J45909 Unspecified asthma, uncomplicated: Secondary | ICD-10-CM

## 2011-02-07 DIAGNOSIS — R0602 Shortness of breath: Secondary | ICD-10-CM

## 2011-02-07 LAB — PULMONARY FUNCTION TEST

## 2011-02-07 NOTE — Progress Notes (Signed)
PFT done today. 

## 2011-02-14 ENCOUNTER — Encounter: Payer: Self-pay | Admitting: Family Medicine

## 2011-02-14 ENCOUNTER — Encounter: Payer: Self-pay | Admitting: *Deleted

## 2011-02-23 LAB — HEPATIC FUNCTION PANEL
AST: 29 U/L (ref 13–35)
Alkaline Phosphatase: 47 U/L (ref 25–125)
Bilirubin, Total: 0.4 mg/dL

## 2011-02-23 LAB — HEMOGLOBIN A1C: Hgb A1c MFr Bld: 6.1 % — AB (ref 4.0–6.0)

## 2011-02-23 LAB — LIPID PANEL
Cholesterol: 180 mg/dL (ref 0–200)
Triglycerides: 163 mg/dL — AB (ref 40–160)

## 2011-02-23 LAB — BASIC METABOLIC PANEL
Potassium: 4.4 mmol/L (ref 3.4–5.3)
Sodium: 139 mmol/L (ref 137–147)

## 2011-02-23 LAB — CBC AND DIFFERENTIAL
HCT: 36 % (ref 36–46)
Hemoglobin: 11.4 g/dL — AB (ref 12.0–16.0)

## 2011-03-04 ENCOUNTER — Other Ambulatory Visit: Payer: Self-pay | Admitting: *Deleted

## 2011-03-04 MED ORDER — LOSARTAN POTASSIUM 25 MG PO TABS: 25.0000 mg | ORAL_TABLET | Freq: Every day | ORAL | Status: DC

## 2011-03-04 MED ORDER — ROSUVASTATIN CALCIUM 10 MG PO TABS: 10.0000 mg | ORAL_TABLET | Freq: Every day | ORAL | Status: DC

## 2011-03-15 ENCOUNTER — Encounter: Payer: BC Managed Care – PPO | Admitting: Family Medicine

## 2011-03-17 ENCOUNTER — Encounter: Payer: Self-pay | Admitting: Family Medicine

## 2011-03-17 ENCOUNTER — Ambulatory Visit (INDEPENDENT_AMBULATORY_CARE_PROVIDER_SITE_OTHER): Payer: BC Managed Care – PPO | Admitting: Family Medicine

## 2011-03-17 ENCOUNTER — Other Ambulatory Visit: Payer: Self-pay | Admitting: Family Medicine

## 2011-03-17 DIAGNOSIS — E039 Hypothyroidism, unspecified: Secondary | ICD-10-CM

## 2011-03-17 DIAGNOSIS — Z780 Asymptomatic menopausal state: Secondary | ICD-10-CM

## 2011-03-17 DIAGNOSIS — G5600 Carpal tunnel syndrome, unspecified upper limb: Secondary | ICD-10-CM

## 2011-03-17 DIAGNOSIS — H04129 Dry eye syndrome of unspecified lacrimal gland: Secondary | ICD-10-CM

## 2011-03-17 DIAGNOSIS — Z1231 Encounter for screening mammogram for malignant neoplasm of breast: Secondary | ICD-10-CM

## 2011-03-17 DIAGNOSIS — E7800 Pure hypercholesterolemia, unspecified: Secondary | ICD-10-CM

## 2011-03-17 DIAGNOSIS — H04123 Dry eye syndrome of bilateral lacrimal glands: Secondary | ICD-10-CM | POA: Insufficient documentation

## 2011-03-17 DIAGNOSIS — R0602 Shortness of breath: Secondary | ICD-10-CM

## 2011-03-17 DIAGNOSIS — E119 Type 2 diabetes mellitus without complications: Secondary | ICD-10-CM

## 2011-03-17 NOTE — Assessment & Plan Note (Signed)
Well-controlled on current meds 

## 2011-03-17 NOTE — Assessment & Plan Note (Signed)
PFTs normal.  Symptoms improved somewhat.

## 2011-03-17 NOTE — Assessment & Plan Note (Signed)
Left eye evaluated with fluroscien drops.. No abrasion seen,  Left eye does appear very dry and has multiplet lines in lower conjectiva that appear like dryness, creasing etc of tissue.

## 2011-03-17 NOTE — Assessment & Plan Note (Signed)
Stable well controlled on crestor.

## 2011-03-17 NOTE — Assessment & Plan Note (Addendum)
Wearing braces, steroid injections in apst. In last 4-5 months weakneed strenth in right grip.  refer back to [facility] for further eval. She is interested in carpal tunnel surgery if needed.  Her symtpoms may simply be due to arthritis in hands making grip difficults and weaker.

## 2011-03-17 NOTE — Assessment & Plan Note (Signed)
Mild elevation in TSH in setting of fatigue.. Increase synthroid  To 50 mcg daily... Which is only a 25 mcg increase per entire week.

## 2011-03-17 NOTE — Progress Notes (Signed)
Subjective:    Patient ID: Valerie Vargas, female    DOB: 05-14-1946, 65 y.o.   MRN: [id number]  HPI  The patient is here for annual wellness exam and preventative care.    65 year old very complicated pt with history including the following:  She was very healthy until 2001... fatigue, SOB, palpitations, syncope, choking.  Saw Pulmonologist, Cardiologist.  Dx Asthma, GERD.. But meds did not help. (Singulair)  Stress test showed tachycardia... Started on toprol.  Saw Dr Marcos Eke...dx then with sleep apnea.  Improved some.  Saw alternative med DR.: Dr. Ananias Pilgrim. Dx with vit D and other vit deficiency... Felt much better.  Also referred to [facility] (Dr. Virginia Crews) where dx with larynx swelling (vocal cord dysfunction)....coughing proceeds attacks.  She is on gabapentin to prevents this... Can take more if having an episode.  Usually every few months. Unknown trigger.  Endoscopy in last few months was normal.  AT LAST APPT: Reporting worsening of vocal cord dysfunction. Using gabapentin 300 mg in AM, mid day and 1-3 tabs at night.  Recently she has been feeling more fatigued in last month.  She wakens feeling well, but gets fatigued as day goes on.  She feels that she breaths shallowly.. She feels that this is cause of fatigue. Has been trying to use albuterol twice a day.... Has helped a little.  Saw Dr. Salena Saner cardiologist in interim.  She did stress test repeat on 6/29.. nml results   PFTS were performed and appeared normal.    TODAY: She reports that her breathing concerns have improved. She still occ gets tired after a few hours of ork each day.    Diabetes:  Well controlled on glucophage Lab Results  Component Value Date   HGBA1C 6.1* 11/02/2010   Labs from work 6.1 on 8/07/25/2010 Using medications without difficulties: Hypoglycemic episodes: 64 Hyperglycemic episodes: None Feet problems: none Blood Sugars averaging: FBS 99, 2 hour post prandial 138 eye exam within last  year:  Hypertension:  At goal on toprol XL Using medication without problems or lightheadedness:  Chest pain with exertion: None Edema:None Short of breath: see above Average home BPs: Not checking. Other issues:  Elevated Cholesterol: Labs from work 02/23/2011  At 78 LDL at goal <100 on crestor 10 mg daily    Hypothyroid: TSH 4.760 on synthroid 50 mcg 6 days a week, 25 mg on 7th day. Also on cytomel.  Seeing Dr. Talmage Nap in 05/16/2011   2004 Began having carpal tunnel symptoms in right wrist following right wrist. Diagnosed with  Nerve conduction. Wearing brace and steroid injections x 2 improved symptoms some.. Recommended surgery. She never had this done. Now in last 4-5 months she has had mild pain, but more significantly weakness in grip strength and  Dropping things in right hand. Occ intermittant numbness in all fingers. [facility].   Review of Systems  Constitutional: Positive for fatigue. Negative for fever.  HENT: Negative for ear pain.   Eyes: Positive for itching. Negative for pain.       Right eye feels like grit in it.  Respiratory: Negative for chest tightness and shortness of breath.   Cardiovascular: Negative for chest pain, palpitations and leg swelling.  Gastrointestinal: Negative for abdominal pain.  Genitourinary: Negative for dysuria.       Objective:   Physical Exam  Constitutional: Vital signs are normal. She appears well-developed and well-nourished. She is cooperative.  Non-toxic appearance. She does not appear ill. No distress.  HENT:  Head: Normocephalic.  Right Ear: Hearing, tympanic membrane, external ear and ear canal normal.  Left Ear: Hearing, tympanic membrane, external ear and ear canal normal.  Nose: Nose normal.  Eyes: Conjunctivae, EOM and lids are normal. Pupils are equal, round, and reactive to light. No foreign bodies found.  Neck: Trachea normal and normal range of motion. Neck supple. Carotid bruit is not present. No mass and  no thyromegaly present.  Cardiovascular: Normal rate, regular rhythm, S1 normal, S2 normal, normal heart sounds and intact distal pulses.  Exam reveals no gallop.   No murmur heard. Pulmonary/Chest: Effort normal and breath sounds normal. No respiratory distress. She has no wheezes. She has no rhonchi. She has no rales.  Abdominal: Soft. Normal appearance and bowel sounds are normal. She exhibits no distension, no fluid wave, no abdominal bruit and no mass. There is no hepatosplenomegaly. There is no tenderness. There is no rebound, no guarding and no CVA tenderness. No hernia.  Genitourinary: Pelvic exam was performed with patient prone. Uterus is not enlarged and not tender. Cervix exhibits no discharge and no friability. Right adnexum displays no mass, no tenderness and no fullness. Left adnexum displays no mass, no tenderness and no fullness.  Musculoskeletal:       ttp in left wrist , only mild Phalens, neg Tinels PIP and DIP joint arthritis B hands. Grip strength 5/5 B.  Lymphadenopathy:    She has no cervical adenopathy.    She has no axillary adenopathy.  Neurological: She is alert. She has normal strength. No cranial nerve deficit or sensory deficit.  Skin: Skin is warm, dry and intact. No rash noted.  Psychiatric: Her speech is normal and behavior is normal. Judgment normal. Her mood appears not anxious. Cognition and memory are normal. She does not exhibit a depressed mood.   Diabetic foot exam: Normal inspection No skin breakdown No calluses  Normal DP pulses Normal sensation to light touch and monofilament Nails normal        Assessment & Plan:  Complete Physical Exam: The patient's preventative maintenance and recommended screening tests for an annual wellness exam were reviewed in full today. Brought up to date unless services declined.  Counselled on the importance of diet, exercise, and its role in overall health and mortality. The patient's FH and SH was reviewed,  including their home life, tobacco status, and drug and alcohol status.   Last mammo 09/2010 Last colonoscopy 2008 Up to Date with PNA and Td. Consider shingles vaccine.  Total Hysterectomy.. No pap or DVE needed. DXA:

## 2011-03-17 NOTE — Patient Instructions (Addendum)
Increase synthroid to 50 mcg daily...recehck TSH level at next appt. with Dr. Talmage Nap. Continue working on exercise and weight loss. Call insurance about shingles vaccine coverage. Stop by front desk to schedule referrals with [facility]. Use refresh drops for eyes aggressively.. If eye symptoms not improving see eye MD for eval.

## 2011-03-29 ENCOUNTER — Encounter: Payer: Self-pay | Admitting: Family Medicine

## 2011-05-09 LAB — BASIC METABOLIC PANEL
BUN: 12 mg/dL (ref 4–21)
Creatinine: 0.9 mg/dL (ref 0.5–1.1)
Glucose: 99 mg/dL
Potassium: 4.2 mmol/L (ref 3.4–5.3)
Sodium: 140 mmol/L (ref 137–147)

## 2011-05-09 LAB — HEPATIC FUNCTION PANEL: AST: 23 U/L (ref 13–35)

## 2011-05-09 LAB — CBC AND DIFFERENTIAL
HCT: 36 % (ref 36–46)
WBC: 6.7 10^3/mL

## 2011-05-09 LAB — LIPID PANEL: HDL: 66 mg/dL (ref 35–70)

## 2011-05-16 ENCOUNTER — Other Ambulatory Visit: Payer: Self-pay | Admitting: Endocrinology

## 2011-05-16 DIAGNOSIS — E049 Nontoxic goiter, unspecified: Secondary | ICD-10-CM

## 2011-05-17 ENCOUNTER — Ambulatory Visit
Admission: RE | Admit: 2011-05-17 | Discharge: 2011-05-17 | Disposition: A | Discharge status: Home / Self Care | Payer: BC Managed Care – PPO | Source: Ambulatory Visit | Attending: Endocrinology | Admitting: Endocrinology

## 2011-05-17 DIAGNOSIS — E049 Nontoxic goiter, unspecified: Secondary | ICD-10-CM

## 2011-06-23 ENCOUNTER — Encounter: Payer: Self-pay | Admitting: Family Medicine

## 2011-06-23 ENCOUNTER — Ambulatory Visit (INDEPENDENT_AMBULATORY_CARE_PROVIDER_SITE_OTHER): Payer: BC Managed Care – PPO | Admitting: Family Medicine

## 2011-06-23 DIAGNOSIS — E119 Type 2 diabetes mellitus without complications: Secondary | ICD-10-CM

## 2011-06-23 DIAGNOSIS — E039 Hypothyroidism, unspecified: Secondary | ICD-10-CM

## 2011-06-23 DIAGNOSIS — M545 Low back pain, unspecified: Secondary | ICD-10-CM | POA: Insufficient documentation

## 2011-06-23 DIAGNOSIS — H04123 Dry eye syndrome of bilateral lacrimal glands: Secondary | ICD-10-CM

## 2011-06-23 DIAGNOSIS — H04129 Dry eye syndrome of unspecified lacrimal gland: Secondary | ICD-10-CM

## 2011-06-23 MED ORDER — AZELASTINE HCL 0.05 % OP SOLN: 1.0000 [drp] | Freq: Two times a day (BID) | OPHTHALMIC | Status: AC

## 2011-06-23 NOTE — Patient Instructions (Addendum)
Trial of eye antihistamine drops for eye.  If not improving try to decrease use of benadryl. Start low back exercises, heat on low back, okay to use diclofenac as needed for pain. Follow up in 11/16/2011 for DM, HTN.

## 2011-06-23 NOTE — Assessment & Plan Note (Signed)
Well controlled. Continue current medication. Offered longacting metformin.. She is concerned about poor absorption in her...will try to take current med more consistently.

## 2011-06-23 NOTE — Assessment & Plan Note (Signed)
Stable control per Dr. Talmage Nap.

## 2011-06-23 NOTE — Assessment & Plan Note (Signed)
Discussed whether grit in eye symptoms due to benadryl vs allergies vs other dry eye.  Will have her try optivar, if not improving  Try to decrease benadryl. If still and issue return to see eye MD. Continue refresh eye lubrication frequently during the day.

## 2011-06-23 NOTE — Assessment & Plan Note (Signed)
Intermittant, not continuous. No indication for films.  Start with heat, home exercsie info given and use diclofenac prn.  Call if not improving as expected, could consider PT.

## 2011-06-23 NOTE — Progress Notes (Signed)
Subjective:    Patient ID: Valerie Vargas, female    DOB: 02/24/1946, 65 y.o.   MRN: [id number]  HPI  65 year old complicated pt with history including the following:   She was very healthy until 2001... fatigue, SOB, palpitations, syncope, choking.  Saw Pulmonologist, Cardiologist.  Dx Asthma, GERD.. But meds did not help. (Singulair)  Stress test showed tachycardia... Started on toprol.  Saw Dr Marcos Eke...dx then with sleep apnea.  Improved some.  Saw alternative med DR.: Dr. Ananias Pilgrim. Dx with vit D and other vit deficiency... Felt much better.  Also referred to [facility] (Dr. Virginia Crews) where dx with larynx swelling (vocal cord dysfunction)....coughing proceeds attacks.  She is on gabapentin to prevents this... Can take more if having an episode.  Usually every few months. Unknown trigger.  Endoscopy in last few months was normal.   AT LAST APPT:  Reporting worsening of vocal cord dysfunction. Using gabapentin 300 mg in AM, mid day and 1-3 tabs at night.  Recently she has been feeling more fatigued in last month.  She wakens feeling well, but gets fatigued as day goes on.  She feels that she breaths shallowly.. She feels that this is cause of fatigue. Has been trying to use albuterol twice a day.... Has helped a little.  Saw Dr. Salena Saner cardiologist in interim.  She did stress test repeat on 6/29.. nml results  PFTS were performed and appeared normal.   TODAY: She reports that her breathing concerns are stable. Has appt with Dr. Mayer Masker next week.  Diabetes: Well controlled on glucophage but she is having trouble remembering to take second dose.Marland Kitchen Has never tried long acting metformin. 04/2011 at work A1C: 5.9 Using medications without difficulties:  Hypoglycemic episodes: 64  Hyperglycemic episodes: None  Feet problems: none  Blood Sugars averaging: FBS 99, 2 hour post prandial 138  eye exam within last year:   Hypertension: At goal on toprol XL  Using medication without  problems or lightheadedness:  Chest pain with exertion: None  Edema:None  Short of breath: see above  Average home BPs: Not checking.  Other issues:  LFTs and cr stable on recent labs.    Hypothyroid: TSH nml on current dose. Also on cytomel.  Seeing Dr. Talmage Nap in 05/16/2011   2004 Began having carpal tunnel symptoms in right wrist following right wrist. Diagnosed with Nerve conduction.  Wearing brace and steroid injections x 2 improved symptoms some.. Recommended surgery.  She never had this done.  Now in last 4-5 months she has had mild pain, but more significantly weakness in grip strength and  Dropping things in right hand. Occ intermittant numbness in all fingers.  Seen at [facility].  Plans surgery at first of year next year.   Continuing to have feeling of grit in left eye, has improved some.Malvin Johns eye MD.. No abnormality (no scratch seen) seen except beginning to get cataracts.. Steroids and refresh has not improved much.  Left eye feels tired, no vision changes. Does use benadryl a lot.. Taking daily. Has tried allegra in past. Allergies are currently well controlled, some mild post nasal drip.  Intermittant catch in left posterior hip, ongoing x 3-4 weeks.[name] when doing housework, bending over. Pain occ runs down leg. Lasts few hours. Gone by next day. Using diclofenac prn, no stomach irritation.  Using heating pad.  No known falls.   Review of Systems  Constitutional: Negative for fever and fatigue.  HENT: Negative for ear pain.   Eyes:  Positive for photophobia, redness and itching. Negative for pain and visual disturbance.  Respiratory: Negative for chest tightness and shortness of breath.   Cardiovascular: Negative for chest pain, palpitations and leg swelling.  Gastrointestinal: Negative for abdominal pain.  Genitourinary: Negative for dysuria.       Objective:   Physical Exam  Constitutional: Vital signs are normal. She appears well-developed and  well-nourished. She is cooperative.  Non-toxic appearance. She does not appear ill. No distress.  HENT:  Head: Normocephalic.  Right Ear: Hearing, tympanic membrane, external ear and ear canal normal. Tympanic membrane is not erythematous, not retracted and not bulging.  Left Ear: Hearing, tympanic membrane, external ear and ear canal normal. Tympanic membrane is not erythematous, not retracted and not bulging.  Nose: No mucosal edema or rhinorrhea. Right sinus exhibits no maxillary sinus tenderness and no frontal sinus tenderness. Left sinus exhibits no maxillary sinus tenderness and no frontal sinus tenderness.  Mouth/Throat: Uvula is midline, oropharynx is clear and moist and mucous membranes are normal.  Eyes: EOM and lids are normal. Pupils are equal, round, and reactive to light. No foreign bodies found. Right conjunctiva is not injected. Right conjunctiva has no hemorrhage. Left conjunctiva is injected. Left conjunctiva has no hemorrhage.  Neck: Trachea normal and normal range of motion. Neck supple. Carotid bruit is not present. No mass and no thyromegaly present.  Cardiovascular: Normal rate, regular rhythm, S1 normal, S2 normal, normal heart sounds, intact distal pulses and normal pulses.  Exam reveals no gallop and no friction rub.   No murmur heard. Pulmonary/Chest: Effort normal and breath sounds normal. Not tachypneic. No respiratory distress. She has no decreased breath sounds. She has no wheezes. She has no rhonchi. She has no rales.  Abdominal: Soft. Normal appearance and bowel sounds are normal. There is no tenderness.  Musculoskeletal:       Lumbar back: She exhibits normal range of motion, no tenderness, no bony tenderness, no swelling and no deformity.       Neg SLR B, neg Faber's  Neurological: She is alert.  Skin: Skin is warm, dry and intact. No rash noted.  Psychiatric: Her speech is normal and behavior is normal. Judgment and thought content normal. Her mood appears not  anxious. Cognition and memory are normal. She does not exhibit a depressed mood.          Assessment & Plan:

## 2011-06-24 ENCOUNTER — Encounter: Payer: Self-pay | Admitting: Family Medicine

## 2011-07-14 ENCOUNTER — Encounter: Payer: Self-pay | Admitting: Family Medicine

## 2011-07-14 ENCOUNTER — Ambulatory Visit (INDEPENDENT_AMBULATORY_CARE_PROVIDER_SITE_OTHER): Payer: BC Managed Care – PPO | Admitting: Family Medicine

## 2011-07-14 ENCOUNTER — Encounter: Payer: Self-pay | Admitting: *Deleted

## 2011-07-14 VITALS — BP 140/70 | HR 73 | Temp 98.1°F | Ht 63.0 in | Wt 145.1 lb

## 2011-07-14 DIAGNOSIS — E039 Hypothyroidism, unspecified: Secondary | ICD-10-CM

## 2011-07-14 DIAGNOSIS — R55 Syncope and collapse: Secondary | ICD-10-CM

## 2011-07-14 DIAGNOSIS — E119 Type 2 diabetes mellitus without complications: Secondary | ICD-10-CM

## 2011-07-14 LAB — CBC WITH DIFFERENTIAL/PLATELET
Basophils Absolute: 0 10*3/uL (ref 0.0–0.1)
Eosinophils Absolute: 0.1 10*3/uL (ref 0.0–0.7)
HCT: 34 % — ABNORMAL LOW (ref 36.0–46.0)
Lymphs Abs: 1.8 10*3/uL (ref 0.7–4.0)
MCV: 89.5 fl (ref 78.0–100.0)
Monocytes Absolute: 0.3 10*3/uL (ref 0.1–1.0)
Platelets: 204 10*3/uL (ref 150.0–400.0)
RDW: 13 % (ref 11.5–14.6)

## 2011-07-14 LAB — BASIC METABOLIC PANEL
BUN: 14 mg/dL (ref 6–23)
Chloride: 103 mEq/L (ref 96–112)
GFR: 67.22 mL/min (ref >60.00)
Glucose, Bld: 97 mg/dL (ref 70–99)
Potassium: 4.6 mEq/L (ref 3.5–5.1)

## 2011-07-14 LAB — TSH: TSH: 1.11 u[IU]/mL (ref 0.35–5.50)

## 2011-07-14 NOTE — Patient Instructions (Signed)
Nice to meet you. Please stop by to see Shirlee Limerick after you to go to the lab to set up your neurology appointment.

## 2011-07-14 NOTE — Progress Notes (Signed)
Subjective:    Patient ID: Valerie Vargas, female    DOB: 1946-02-15, 65 y.o.   MRN: [id number]  HPI  65 year old complicated pt of Dr. Ermalene Searing, new to me here for "blacked out on Saturday."  She was walking in grocery store on Saturday with her husband when she felt her legs "get wobbly" and her husband caught her. Says she never actually lost consciousness completely.  She could hear him talking to her but could not respond. Husband reports this went on for several minutes.  Did not lose bowel or bladder control.  Felt "off" for rest of the day and the following day.  Also had pressure on top of her head.  Never had an episode quite like this in past.  Pt is a diabetic but from reviewing her records, appears to be well controllled.    She is on glucophage. 04/2011 at work A1C: 5.9 Blood Sugars averaging: FBS 99, 2 hour post prandial 135  Denies any recent episodes of hypoglycemia. Does not know what her blood sugar was when this "black out" episode occurred.  Hypothyroid: TSH nml on current dose. Also on cytomel.  Seeing Dr. Talmage Nap.  Of note, mother has a seizure d/o.  Patient Active Problem List  Diagnoses  . HYPOTHYROIDISM  . DM  . OBSTRUCTIVE SLEEP APNEA  . HX of VENTRICULAR TACHYCARDIA  . OTHER DISEASES OF VOCAL CORDS  . ASTHMA  . GASTROESOPHAGEAL REFLUX DISEASE  . IRRITABLE BOWEL SYNDROME  . INTERSTITIAL CYSTITIS  . COUGH, chronic   . ALLERGY  . Spasm larynx and swelling episodes  . Vocal cord dysfunction  . High cholesterol  . Carpal tunnel syndrome  . Dry eyes  . Low back pain  . Syncope   Past Medical History  Diagnosis Date  . Cough   . Chronic interstitial cystitis   . Unspecified hypothyroidism   . Allergy, unspecified not elsewhere classified   . Paroxysmal ventricular tachycardia   . Type II or unspecified type diabetes mellitus without mention of complication, not stated as uncontrolled   . Unspecified asthma   . Irritable bowel  syndrome   . Esophageal reflux   . Obstructive sleep apnea (adult) (pediatric)    Past Surgical History  Procedure Date  . Cholecystectomy   . Abdominal hysterectomy     for endometriosis   History  Substance Use Topics  . Smoking status: Never Smoker   . Smokeless tobacco: Never Used  . Alcohol Use: No   Family History  Problem Relation Age of Onset  . Diabetes Mother   . Breast cancer Mother   . Arthritis Mother   . Diabetes Father   . Stomach cancer Father   . Heart disease Father   . Leukemia Brother    Allergies  Allergen Reactions  . Codeine   . Penicillins    Current Outpatient Prescriptions on File Prior to Visit  Medication Sig Dispense Refill  . 5-Hydroxytryptophan (5-HTP) 100 MG CAPS Take 1 capsule by mouth 3 (three) times daily.        Marland Kitchen albuterol (PROVENTIL,VENTOLIN) 90 MCG/ACT inhaler Inhale 2 puffs into the lungs as needed.        . ALPRAZolam (XANAX) 0.5 MG tablet Take 0.5 mg by mouth as needed.        . Ascorbic Acid (VITAMIN C) 1000 MG tablet Take 1,000 mg by mouth daily.        Marland Kitchen aspirin 81 MG tablet Take 81 mg by mouth daily.        Marland Kitchen  azelastine (OPTIVAR) 0.05 % ophthalmic solution Place 1 drop into both eyes 2 (two) times daily.  6 mL  0  . benzonatate (TESSALON) 100 MG capsule Take 100 mg by mouth at bedtime as needed.        . Cholecalciferol (VITAMIN D3) 1000 UNITS CAPS Take 1 capsule by mouth daily.        . Cinnamon 500 MG capsule Take 500 mg by mouth 2 (two) times daily.        . Coenzyme Q10 (CO Q 10) 100 MG CAPS Take 400 mg by mouth daily.        Marland Kitchen desloratadine (CLARINEX) 5 MG tablet Take 5 mg by mouth daily.        . Diphenhydramine-PE-APAP (BENADRYL ALLERGY/SINUS HEADACH) 12.5-5-325 MG TABS Take 0.5 tablets by mouth at bedtime.        Marland Kitchen estradiol (ESTRACE) 0.1 MG/GM vaginal cream Place 0.5 Applicatorfuls vaginally once a week. Use once a week  42.5 g  1  . gabapentin (NEURONTIN) 100 MG tablet Take 300 mg by mouth 3 (three) times daily.         . Guaifenesin 1200 MG TB12 Take by mouth as needed.        Marland Kitchen L-Theanine 100 MG CAPS Take 2 tablets by mouth 3 (three) times daily.        Marland Kitchen levothyroxine (SYNTHROID, LEVOTHROID) 50 MCG tablet Take 50 mcg by mouth daily.        Marland Kitchen liothyronine (CYTOMEL) 5 MCG tablet Take 5 mcg by mouth daily.        Marland Kitchen losartan (COZAAR) 25 MG tablet Take 1 tablet (25 mg total) by mouth daily.  30 tablet  11  . MAGNESIUM GLYCINATE PLUS PO Take 1 capsule by mouth 2 (two) times daily.        . metFORMIN (GLUCOPHAGE) 500 MG tablet Take 500 mg by mouth 2 (two) times daily with a meal.       . metoprolol (TOPROL-XL) 50 MG 24 hr tablet Take 50 mg by mouth daily.        . OMEGA-3 1000 MG CAPS Take 1,000 mg by mouth daily.        Marland Kitchen pyridoxine (B-6) 200 MG tablet Take 200 mg by mouth daily.        . rosuvastatin (CRESTOR) 10 MG tablet Take 1 tablet (10 mg total) by mouth daily.  30 tablet  11  . Thiamine HCl (VITAMIN B-1) 250 MG tablet Take 250 mg by mouth daily.        . vitamin B-12 (CYANOCOBALAMIN) 1000 MCG tablet Take 1,000 mcg by mouth daily.         The PMH, PSH, Social History, Family History, Medications, and allergies have been reviewed in [facility], and have been updated if relevant.   Review of Systems  See HPI     Objective:   Physical Exam  BP 140/70  Pulse 73  Temp(Src) 98.1 F (36.7 C) (Oral)  Ht 5\' 3"  (1.6 m)  Wt 145 lb 1.9 oz (65.826 kg)  BMI 25.71 kg/m2  SpO2 98%  Constitutional: Vital signs are normal. She appears well-developed and well-nourished. She is cooperative.  Non-toxic appearance. She does not appear ill. No distress.  HENT:  Head: Normocephalic.  Right Ear: Hearing, tympanic membrane, external ear and ear canal normal. Tympanic membrane is not erythematous, not retracted and not bulging.  Left Ear: Hearing, tympanic membrane, external ear and ear canal normal. Tympanic membrane is not erythematous, not  retracted and not bulging.  Nose: No mucosal edema or rhinorrhea. Right sinus  exhibits no maxillary sinus tenderness and no frontal sinus tenderness. Left sinus exhibits no maxillary sinus tenderness and no frontal sinus tenderness.  Mouth/Throat: Uvula is midline, oropharynx is clear and moist and mucous membranes are normal.  Eyes: EOM and lids are normal. Pupils are equal, round, and reactive to light. No foreign bodies found. Right conjunctiva is not injected. Right conjunctiva has no hemorrhage. Left conjunctiva is injected. Left conjunctiva has no hemorrhage.  Neck: Trachea normal and normal range of motion. Neck supple. Carotid bruit is not present. No mass and no thyromegaly present.  Cardiovascular: Normal rate, regular rhythm, S1 normal, S2 normal, normal heart sounds, intact distal pulses and normal pulses.  Exam reveals no gallop and no friction rub.   No murmur heard. Pulmonary/Chest: Effort normal and breath sounds normal. Not tachypneic. No respiratory distress. She has no decreased breath sounds. She has no wheezes. She has no rhonchi. She has no rales.  Abdominal: Soft. Normal appearance and bowel sounds are normal. There is no tenderness.  Neurological: She is alert. , reflexes normal and symmetrical, CN II- XII intact, normal gait. Skin: Skin is warm, dry and intact. No rash noted.  Psychiatric: Her speech is normal and behavior is normal. Judgment and thought content normal. Her mood appears not anxious. Cognition and memory are normal. She does not exhibit a depressed mood.     Assessment & Plan:   1. Syncope  Basic Metabolic Panel (BMET), CBC w/Diff, B12, Ambulatory referral to Neurology TSH, T4, free   New, resolved with unclear etiology. ? Possible seizure disorder given her description of events and possible post ictal state, however she does have multiple co morbidities that could also be playing a roll. Will check blood work today and refer to neurology for further work up. The patient indicates understanding of these issues and agrees with the  plan.

## 2011-08-24 ENCOUNTER — Ambulatory Visit (INDEPENDENT_AMBULATORY_CARE_PROVIDER_SITE_OTHER): Payer: BC Managed Care – PPO | Admitting: Neurology

## 2011-08-24 ENCOUNTER — Ambulatory Visit: Payer: BC Managed Care – PPO | Admitting: Neurology

## 2011-08-24 ENCOUNTER — Encounter: Payer: Self-pay | Admitting: Neurology

## 2011-08-24 VITALS — BP 118/78 | HR 76 | Wt 143.0 lb

## 2011-08-24 DIAGNOSIS — R569 Unspecified convulsions: Secondary | ICD-10-CM

## 2011-08-24 NOTE — Progress Notes (Signed)
Dear Dr. Dayton Martes,  Thank you for having me see Valerie Vargas in consultation today at [facility] for her problem with a spell of loss of consciousness.  As you may recall, she is a 66 y.o. year old female with a history of paroxysmal ventricular tachycardia, type two diabetes who presents with a spell spell just before Christmas.  At [employer]  Feet started moving randomly while standing, and she had whiteout of vision.  Her husband, said it looked like she "flipped the switch"  and "wasn't there".  Had headache afterwards.  Felt fatigued for a day after.  The first sense was noting the abnormal leg movements.  The actual spell lasted less than a minute.  Could not respond during the event.  Took 5 minutes to respond.    No history of a spell like this before.  Have noticed in the last year that she would have period of "foggy" brain.  Thought it was diabetes, but blood sugar was normal when checked.  Lasts 30 minutes - 1 hour.  Once every couple weeks.  Did have problems in the past with passing out.  Would wake up at 4 a.m., sit on the commode, emptying bladder, would pass out.  Has not had this for years.  No seizures as a child, fell and hit head, 2 years ago.  Doesn't know why she fell.  Has been having falls for years -- doesn't know why.     Past Medical History  Diagnosis Date  . Cough   . Chronic interstitial cystitis   . Unspecified hypothyroidism   . Allergy, unspecified not elsewhere classified   . Paroxysmal ventricular tachycardia   . Type II or unspecified type diabetes mellitus without mention of complication, not stated as uncontrolled   . Unspecified asthma   . Irritable bowel syndrome   . Esophageal reflux   . Obstructive sleep apnea (adult) (pediatric)   . Ventricular tachycardia    - no history of severe head trauma, febrile seizures or meningitis.  Past Surgical History  Procedure Date  . Cholecystectomy   . Abdominal hysterectomy     for endometriosis     History   Social History  . Marital Status: Married    Spouse Name: N/A    Number of Children: N/A  . Years of Education: N/A   Social History Main Topics  . Smoking status: Never Smoker   . Smokeless tobacco: Never Used  . Alcohol Use: No  . Drug Use: None  . Sexually Active: None   Other Topics Concern  . None   Social History Narrative  . None    Family History  Problem Relation Age of Onset  . Diabetes Mother   . Breast cancer Mother   . Arthritis Mother   . Diabetes Father   . Stomach cancer Father   . Heart disease Father   . Leukemia Brother     Current Outpatient Prescriptions on File Prior to Visit  Medication Sig Dispense Refill  . albuterol (PROVENTIL,VENTOLIN) 90 MCG/ACT inhaler Inhale 2 puffs into the lungs as needed.        . ALPRAZolam (XANAX) 0.5 MG tablet Take 0.5 mg by mouth as needed.        . Ascorbic Acid (VITAMIN C) 1000 MG tablet Take 1,000 mg by mouth daily.        Marland Kitchen aspirin 81 MG tablet Take 81 mg by mouth daily.        Marland Kitchen  azelastine (OPTIVAR) 0.05 % ophthalmic solution Place 1 drop into both eyes 2 (two) times daily.  6 mL  0  . benzonatate (TESSALON) 100 MG capsule Take 100 mg by mouth at bedtime as needed.        . Cholecalciferol (VITAMIN D3) 1000 UNITS CAPS Take 1 capsule by mouth daily.        . Cinnamon 500 MG capsule Take 500 mg by mouth 2 (two) times daily.        . Coenzyme Q10 (CO Q 10) 100 MG CAPS Take 400 mg by mouth daily.        . Diphenhydramine-PE-APAP (BENADRYL ALLERGY/SINUS HEADACH) 12.5-5-325 MG TABS Take 0.5 tablets by mouth at bedtime.        Marland Kitchen estradiol (ESTRACE) 0.1 MG/GM vaginal cream Place 0.5 Applicatorfuls vaginally once a week. Use once a week  42.5 g  1  . gabapentin (NEURONTIN) 100 MG tablet Take 300 mg by mouth 3 (three) times daily.        . Guaifenesin 1200 MG TB12 Take by mouth as needed.        Marland Kitchen L-Theanine 100 MG CAPS Take 2 tablets by mouth 3 (three) times daily.        Marland Kitchen levothyroxine (SYNTHROID,  LEVOTHROID) 50 MCG tablet Take 50 mcg by mouth daily.        Marland Kitchen liothyronine (CYTOMEL) 5 MCG tablet Take 5 mcg by mouth daily.        Marland Kitchen losartan (COZAAR) 25 MG tablet Take 1 tablet (25 mg total) by mouth daily.  30 tablet  11  . MAGNESIUM GLYCINATE PLUS PO Take 1 capsule by mouth 2 (two) times daily.        . metFORMIN (GLUCOPHAGE) 500 MG tablet Take 500 mg by mouth 2 (two) times daily with a meal.       . metoprolol (TOPROL-XL) 50 MG 24 hr tablet Take 50 mg by mouth daily.        . OMEGA-3 1000 MG CAPS Take 1,000 mg by mouth daily.        Marland Kitchen pyridoxine (B-6) 200 MG tablet Take 200 mg by mouth daily.        . rosuvastatin (CRESTOR) 10 MG tablet Take 1 tablet (10 mg total) by mouth daily.  30 tablet  11  . Thiamine HCl (VITAMIN B-1) 250 MG tablet Take 250 mg by mouth daily.        . vitamin B-12 (CYANOCOBALAMIN) 1000 MCG tablet Take 1,000 mcg by mouth daily.        Marland Kitchen 5-Hydroxytryptophan (5-HTP) 100 MG CAPS Take 1 capsule by mouth 3 (three) times daily.          Allergies  Allergen Reactions  . Codeine   . Penicillins       ROS:  13 systems were reviewed and are notable for burning in the left eye, thought to be dry eye.  All other review of systems are unremarkable.   Examination:  Filed Vitals:   08/24/11 1506  BP: 118/78  Pulse: 76  Weight: 143 lb (64.864 kg)     In general, well appearing older women.  Cardiovascular: The patient has a regular rate and rhythm and no carotid bruits.  Fundoscopy:  Disks are flat. Vessel caliber within normal limits.  Mental status:   The patient is oriented to person, place and time. Recent and remote memory are intact. Attention span and concentration are normal. Language including repetition, naming, following commands are intact. Fund  of knowledge of current and historical events, as well as vocabulary are normal.  Cranial Nerves: Pupils are equally round and reactive to light. Visual fields full to confrontation. Extraocular movements  are intact without nystagmus. Facial sensation and muscles of mastication are intact. Muscles of facial expression are symmetric. Hearing intact to bilateral finger rub. Tongue protrusion, uvula, palate midline.  Shoulder shrug intact  Motor:  The patient has normal bulk and tone, no pronator drift.  There are no adventitious movements.  5/5 muscle strength bilaterally.  Reflexes:   Biceps  Triceps Brachioradialis Knee Ankle  Right 2+  2+  2+   2+ 2+  Left  2+  2+  2+   2+ 2+  Toes down  Coordination:  Normal finger to nose.  No dysdiadokinesia.  Sensation is intact to temperature and vibration.  Gait and Station are normal.  Tandem gait is impaired.  Romberg is negative   Impression: Possible focal seizure with impairment in consciousness.     Recommendations:  I am going to get an routine EEG and MRI brain seizure protocol.  Depending on this, I may start her on medication.  I may be tempted to do this because of these "foggy" episodes.  If the EEG is normal I may get a sleep deprived EEG.   We will see the patient back in 3 months.  Thank you for having [place] see Valerie Vargas in consultation.  Feel free to contact me with any questions.  Lupita Raider Modesto Charon, MD [facility], [place] [address] [place], [place] [address] Phone: [phone] Fax: [phone].

## 2011-08-24 NOTE — Patient Instructions (Addendum)
Your MRI is scheduled at [facility] located at [address] on Monday, February 11th at 8:30 am; arrive 8:15 am to check in.  [phone].  Your EEG will be scheduled at [facility]. I will call you with that appointment date and time.

## 2011-08-29 ENCOUNTER — Ambulatory Visit
Admission: RE | Admit: 2011-08-29 | Discharge: 2011-08-29 | Disposition: A | Discharge status: Home / Self Care | Payer: BC Managed Care – PPO | Source: Ambulatory Visit | Attending: Neurology | Admitting: Neurology

## 2011-08-29 DIAGNOSIS — R569 Unspecified convulsions: Secondary | ICD-10-CM

## 2011-09-01 ENCOUNTER — Ambulatory Visit (HOSPITAL_COMMUNITY)
Admission: RE | Admit: 2011-09-01 | Discharge: 2011-09-01 | Disposition: A | Discharge status: Home / Self Care | Payer: BC Managed Care – PPO | Source: Ambulatory Visit | Attending: Neurology | Admitting: Neurology

## 2011-09-01 DIAGNOSIS — R569 Unspecified convulsions: Secondary | ICD-10-CM

## 2011-09-01 DIAGNOSIS — Z1389 Encounter for screening for other disorder: Secondary | ICD-10-CM | POA: Insufficient documentation

## 2011-09-02 NOTE — Procedures (Signed)
EEG NUMBER:  [id number].  This routine EEG was requested in this 66 year old woman who has had a spell of abnormal leg movements.  She then was unable to answer questions.  Patient's medications include her gabapentin.  The EEG was done with the patient awake. During periods of maximal wakefulness, she had 12 cycle per second posterior dominant rhythm that attenuated with eye opening with symmetric.  Background activities were characterized by very low amplitude frontally dominant beta activities that were symmetric.  Photic stimulation produced symmetric driving response. Hyperventilation for 3 minutes  good effort produced significant buildup in the form of prolonged bursts of diffuse theta and delta activities that at times did seem slightly slower over the left temporal region. In addition up to 1 minute and 30 seconds afterwards, there was burst of diffuse theta and delta range slowing and again at times did appear to be of slightly higher amplitude and slightly slower over the left temporal region.  After that point in time, the EEG returned to baseline.  Patient did not sleep.  CLINICAL INTERPRETATION:  This routine EEG done with the patient awake is essentially normal.  The significant buildup that was of slightly slower frequencies over the left temporal region in a patient of this age is atypical.  In addition, prolonged changes after the hyperventilation was also atypical.  It is of uncertain clinical significance.          ______________________________ Denton Meek, MD    [id number] D:  09/02/2011 22:39:43  T:  09/02/2011 23:22:36  Job #:  [id number]

## 2011-09-05 ENCOUNTER — Telehealth: Payer: Self-pay | Admitting: Neurology

## 2011-09-05 ENCOUNTER — Other Ambulatory Visit: Payer: Self-pay | Admitting: Neurology

## 2011-09-05 DIAGNOSIS — I639 Cerebral infarction, unspecified: Secondary | ICD-10-CM

## 2011-09-05 NOTE — Telephone Encounter (Signed)
Called and spoke with the pt. Scheduled the MRA head and neck at [facility] on 02/20 at 11:00 am to arrive at 10:45 at the radiology department. The pt also understands she needs to have a BMP prior to the test and will do that either today or in the am. No other questions or concerns.

## 2011-09-05 NOTE — Telephone Encounter (Signed)
Message copied by Benay Spice on Mon Sep 05, 2011  9:17 AM ------      Message from: Milas Gain      Created: Sat Sep 03, 2011 10:07 AM       talked to patient.  I think the infarction is likely not significant.  However, patient had significant post-hyperventilation changes on EEG that were asymmetric.  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              Jan - Can you setup an MRA neck and head, with the indication of ischemic stroke?   thx.

## 2011-09-05 NOTE — Telephone Encounter (Signed)
Called and spoke with the pt. Scheduled the MRA head and neck at [facility] on 2/20 at 11:00 am to arrive at 10:45 am at the radiology department. The patient also needs a BMP prior to the test. She understands it should be done either today or in the am. No additional questions or concerns.

## 2011-09-05 NOTE — Telephone Encounter (Signed)
Message copied by Benay Spice on Mon Sep 05, 2011  9:21 AM ------      Message from: Milas Gain      Created: Sat Sep 03, 2011 10:08 AM       can you setup an MRA head and neck?

## 2011-09-06 ENCOUNTER — Other Ambulatory Visit (INDEPENDENT_AMBULATORY_CARE_PROVIDER_SITE_OTHER): Payer: BC Managed Care – PPO

## 2011-09-06 DIAGNOSIS — I639 Cerebral infarction, unspecified: Secondary | ICD-10-CM

## 2011-09-06 DIAGNOSIS — I6350 Cerebral infarction due to unspecified occlusion or stenosis of unspecified cerebral artery: Secondary | ICD-10-CM

## 2011-09-06 LAB — BASIC METABOLIC PANEL
BUN: 13 mg/dL (ref 6–23)
Calcium: 9.6 mg/dL (ref 8.4–10.5)
GFR: 63.09 mL/min (ref >60.00)
Potassium: 4.9 mEq/L (ref 3.5–5.1)
Sodium: 138 mEq/L (ref 135–145)

## 2011-09-07 ENCOUNTER — Ambulatory Visit (HOSPITAL_COMMUNITY)
Admission: RE | Admit: 2011-09-07 | Discharge: 2011-09-07 | Disposition: A | Discharge status: Home / Self Care | Payer: BC Managed Care – PPO | Source: Ambulatory Visit | Attending: Neurology | Admitting: Neurology

## 2011-09-07 DIAGNOSIS — I639 Cerebral infarction, unspecified: Secondary | ICD-10-CM

## 2011-09-07 DIAGNOSIS — E119 Type 2 diabetes mellitus without complications: Secondary | ICD-10-CM | POA: Insufficient documentation

## 2011-09-07 DIAGNOSIS — R569 Unspecified convulsions: Secondary | ICD-10-CM | POA: Insufficient documentation

## 2011-09-07 DIAGNOSIS — I10 Essential (primary) hypertension: Secondary | ICD-10-CM | POA: Insufficient documentation

## 2011-09-07 MED ORDER — GADOBENATE DIMEGLUMINE 529 MG/ML IV SOLN: 13.0000 mL | Freq: Once | INTRAVENOUS | Status: DC

## 2011-09-08 ENCOUNTER — Telehealth: Payer: Self-pay | Admitting: Neurology

## 2011-09-08 ENCOUNTER — Other Ambulatory Visit: Payer: Self-pay | Admitting: Neurology

## 2011-09-08 DIAGNOSIS — R569 Unspecified convulsions: Secondary | ICD-10-CM

## 2011-09-08 NOTE — Telephone Encounter (Signed)
Called and spoke with the patient at her work number. Info given as per Dr. Modesto Charon re: MRA. Patient is asking "what next?" Last office note mentions ? Starting medication as well as getting a SD EEG if the routine EEG came back normal. I told her that I would check with Dr. Modesto Charon and let her know his recommendations. The patient was fine with this plan. **Sr, Modesto Charon please advise next step in plan of care.

## 2011-09-08 NOTE — Telephone Encounter (Signed)
Called and spoke with the patient. Aware of the SD EEG scheduled at [facility] on 02/28 at 0800; check in first floor admitting at 7:45 am. Understands that she needs to stay awake after midnight prior to the test and have no caffeine. No other questions or concerns.

## 2011-09-08 NOTE — Telephone Encounter (Signed)
Message copied by Benay Spice on Thu Sep 08, 2011 10:06 AM ------      Message from: Denton Meek H      Created: Wed Sep 07, 2011 10:43 PM       Let Ms. Klenke know her blood vessels in her head and neck looked ok on the MRI.

## 2011-09-08 NOTE — Telephone Encounter (Signed)
Yes.  Let's go ahead an get a sleep deprived EEG.

## 2011-09-15 ENCOUNTER — Ambulatory Visit (HOSPITAL_COMMUNITY)
Admission: RE | Admit: 2011-09-15 | Discharge: 2011-09-15 | Disposition: A | Discharge status: Home / Self Care | Payer: BC Managed Care – PPO | Source: Ambulatory Visit | Attending: Neurology | Admitting: Neurology

## 2011-09-15 DIAGNOSIS — Z79899 Other long term (current) drug therapy: Secondary | ICD-10-CM | POA: Insufficient documentation

## 2011-09-15 DIAGNOSIS — R569 Unspecified convulsions: Secondary | ICD-10-CM | POA: Insufficient documentation

## 2011-09-15 NOTE — Progress Notes (Signed)
Sleep deprived EEG completed. 

## 2011-09-18 NOTE — Procedures (Signed)
   This sleep-deprived EEG was requested in this 66 year old woman with a history of possible seizure.  She has had a previous routine EEG that did not show any interictal epileptiform discharges.  Her medications include gabapentin.  The EEG was done with the patient awake and drowsy.  During periods of maximal wakefulness, she had 12 cycles per second posterior dominant rhythm that attenuated with eye opening and was symmetric.  Background activity was composed of low amplitude of beta activities that were symmetric.  Photic stimulation did not produce driving response, hyperventilation for 3 minutes, did produce bursts of theta frequency slowing that appeared to be of higher amplitude over the left hemisphere.  The patient became drowsy.  It was characterized by attenuation of the alpha rhythm and bursts of slow theta and delta frequency slowing.  The bursts of theta frequency slowing again were seen better and of higher amplitude over the left hemisphere.  CLINICAL INTERPRETATION:  This sleep-deprived EEG done with the patient awake and drowsy is abnormal.  Asymmetric bursts of theta frequency slowing was seen better over the left hemisphere during both hyperventilation and drowsiness and suggests an underlying area of structural or functional abnormality in that location.          ______________________________ Denton Meek, MD    [id number] D:  09/18/2011 22:09:17  T:  09/18/2011 04:54:09  Job #:  [id number]

## 2011-09-19 ENCOUNTER — Telehealth: Payer: Self-pay | Admitting: Neurology

## 2011-09-19 NOTE — Telephone Encounter (Signed)
Called and spoke with the patient. Information given as per Dr. Modesto Charon below. Instructed the patient to call if she had any further episodes. The patient states she will.

## 2011-09-19 NOTE — Telephone Encounter (Signed)
Message copied by Benay Spice on Mon Sep 19, 2011 11:28 AM ------      Message from: Milas Gain      Created: Mon Sep 19, 2011 11:12 AM       Please let Valerie Vargas know her sleep deprived EEG did not show any obvious abnormalities that would indicate she is at high risk of having another seizure(if that what her first event was).  I would not start an anti seizure medication right now, and just wait to see how she does.

## 2011-10-05 LAB — HM DEXA SCAN: HM Dexa Scan: NORMAL

## 2011-10-13 ENCOUNTER — Ambulatory Visit
Admission: RE | Admit: 2011-10-13 | Discharge: 2011-10-13 | Disposition: A | Discharge status: Home / Self Care | Payer: BC Managed Care – PPO | Source: Ambulatory Visit | Attending: Family Medicine | Admitting: Family Medicine

## 2011-10-13 DIAGNOSIS — Z1231 Encounter for screening mammogram for malignant neoplasm of breast: Secondary | ICD-10-CM

## 2011-10-13 DIAGNOSIS — Z780 Asymptomatic menopausal state: Secondary | ICD-10-CM

## 2011-10-17 ENCOUNTER — Encounter: Payer: Self-pay | Admitting: *Deleted

## 2011-11-18 ENCOUNTER — Encounter: Payer: Self-pay | Admitting: Family Medicine

## 2011-11-18 ENCOUNTER — Ambulatory Visit (INDEPENDENT_AMBULATORY_CARE_PROVIDER_SITE_OTHER): Payer: BC Managed Care – PPO | Admitting: Family Medicine

## 2011-11-18 VITALS — BP 130/72 | HR 74 | Temp 98.2°F | Ht 63.0 in | Wt 143.1 lb

## 2011-11-18 DIAGNOSIS — M79609 Pain in unspecified limb: Secondary | ICD-10-CM

## 2011-11-18 DIAGNOSIS — E119 Type 2 diabetes mellitus without complications: Secondary | ICD-10-CM

## 2011-11-18 DIAGNOSIS — R569 Unspecified convulsions: Secondary | ICD-10-CM

## 2011-11-18 DIAGNOSIS — M79671 Pain in right foot: Secondary | ICD-10-CM

## 2011-11-18 DIAGNOSIS — E7800 Pure hypercholesterolemia, unspecified: Secondary | ICD-10-CM

## 2011-11-18 NOTE — Patient Instructions (Addendum)
Follow up appt in 1-2 months. For DM follow up after labs return. Increase fiber and protein in diet. Call if leg issues not better with inserts.

## 2011-11-18 NOTE — Progress Notes (Signed)
Subjective:    Patient ID: Valerie Vargas, female    DOB: 01/25/1946, 66 y.o.   MRN: [id number]  HPI  66 year old complicated pt with history including the following:  She was very healthy until 2001... fatigue, SOB, palpitations, syncope, choking.  Saw Pulmonologist, Cardiologist.  Dx Asthma, GERD.. But meds did not help. (Singulair)  Stress test showed tachycardia... Started on toprol.  Saw Dr Marcos Eke...dx then with sleep apnea.  Improved some.  Saw alternative med DR.: Dr. Ananias Pilgrim. Dx with vit D and other vit deficiency... Felt much better.  Also referred to [facility] (Dr. Virginia Crews) where dx with larynx swelling (vocal cord dysfunction)....coughing proceeds attacks.  She is on gabapentin to prevents this... Can take more if having an episode.  Usually every few months. Unknown trigger.  Endoscopy in last few months was normal.   Since last appt:  12/22:  Was having regualr day, feeling well. When at [facility] she experienced... Seizure like episode, no LOC, lasted 5-10 min, no proceding symptoms (no CP, no SOB, no palpitations at that time) ( see Dr. Modesto Charon Neuro's note) Finished shopping, although had a headache for a few days.  MRI/MRA showed: 1. No acute intracranial abnormality.  2. Negative high-resolution seizure imaging.  3. Tiny chronic lacunar infarct suspected in the right inferior  cerebellum, normal for age signal elsewhere... Had second that showed this was not an issue  EEG and EEG sleep deprived: no abnormality Cannot drive for 6 months.. In last month. She is already on gabapentin for other issues.  No further episodes. Occ foggy brained and sometimes feels like walks to right. TSH, B12, CMET nml in 07/2011.  Has labs for DM, chol done  On 12/2011 Blood sugars running well at home,sometimes up and down.  Hypertension:   Well controlled   Using medication without problems or lightheadedness: none Chest pain with exertion:none Edema:none Short of  breath:none Average home [id number] Other issues:  Does have right calf pain. Worse with walking, better at rest. Has upcoming appt with foot MD.  Ofilia Neas inserts in shoes that may be outdated.  In past had this issue with this and it got better with inserts.  Review of Systems See HPI, no other pertinant positives. No dysuria, no abdominal pain. No back pain.    Objective:   Physical Exam  Constitutional: Vital signs are normal. She appears well-developed and well-nourished. She is cooperative.  Non-toxic appearance. She does not appear ill. No distress.  HENT:  Head: Normocephalic.  Right Ear: Hearing, tympanic membrane, external ear and ear canal normal. Tympanic membrane is not erythematous, not retracted and not bulging.  Left Ear: Hearing, tympanic membrane, external ear and ear canal normal. Tympanic membrane is not erythematous, not retracted and not bulging.  Nose: No mucosal edema or rhinorrhea. Right sinus exhibits no maxillary sinus tenderness and no frontal sinus tenderness. Left sinus exhibits no maxillary sinus tenderness and no frontal sinus tenderness.  Mouth/Throat: Uvula is midline, oropharynx is clear and moist and mucous membranes are normal.  Eyes: Conjunctivae, EOM and lids are normal. Pupils are equal, round, and reactive to light. No foreign bodies found.  Neck: Trachea normal and normal range of motion. Neck supple. Carotid bruit is not present. No mass and no thyromegaly present.  Cardiovascular: Normal rate, regular rhythm, S1 normal, S2 normal, normal heart sounds, intact distal pulses and normal pulses.  Exam reveals no gallop and no friction rub.   No murmur heard. Pulmonary/Chest: Effort normal and breath sounds  normal. Not tachypneic. No respiratory distress. She has no decreased breath sounds. She has no wheezes. She has no rhonchi. She has no rales.  Abdominal: Soft. Normal appearance and bowel sounds are normal. There is no tenderness.  Neurological:  She is alert.  Skin: Skin is warm, dry and intact. No rash noted.  Psychiatric: Her speech is normal and behavior is normal. Judgment and thought content normal. Her mood appears not anxious. Cognition and memory are normal. She does not exhibit a depressed mood.   Diabetic foot exam: Normal inspection No skin breakdown Mild calluses  Normal DP pulses Normal sensation to light touch and monofilament Nails normal        Assessment & Plan:

## 2011-11-23 DIAGNOSIS — R569 Unspecified convulsions: Secondary | ICD-10-CM | POA: Insufficient documentation

## 2011-11-23 DIAGNOSIS — M79671 Pain in right foot: Secondary | ICD-10-CM | POA: Insufficient documentation

## 2011-11-23 NOTE — Assessment & Plan Note (Signed)
Mother with PVD. Pulses nml in this pt.  Pain may be due to poor arch support.. Has appt ith podiatrist for orthotics... Will let me know if issue not resolved with these.

## 2011-11-23 NOTE — Assessment & Plan Note (Signed)
Neg work up with Neuro. No further episodes. Almost completed driving limitations  X 6 months.

## 2011-11-23 NOTE — Assessment & Plan Note (Signed)
Due for re-eval. 

## 2011-11-25 ENCOUNTER — Ambulatory Visit: Payer: BC Managed Care – PPO | Admitting: Family Medicine

## 2011-12-06 DIAGNOSIS — Z0279 Encounter for issue of other medical certificate: Secondary | ICD-10-CM

## 2011-12-28 LAB — HEPATIC FUNCTION PANEL
ALT: 21 U/L (ref 7–35)
AST: 25 U/L (ref 13–35)
Alkaline Phosphatase: 48 U/L (ref 25–125)
Bilirubin, Total: 0.4 mg/dL

## 2011-12-28 LAB — BASIC METABOLIC PANEL
BUN: 18 mg/dL (ref 4–21)
Creatinine: 0.9 mg/dL (ref <=1.1)
Glucose: 89 mg/dL
Potassium: 4.2 mmol/L (ref 3.4–5.3)
Sodium: 137 mmol/L (ref 137–147)

## 2011-12-28 LAB — TSH: TSH: 2.3 u[IU]/mL (ref <=5.90)

## 2011-12-28 LAB — HEMOGLOBIN A1C: Hgb A1c MFr Bld: 6 % (ref 4.0–6.0)

## 2011-12-28 LAB — CBC AND DIFFERENTIAL: WBC: 7.1 10^3/mL

## 2012-01-20 ENCOUNTER — Ambulatory Visit: Payer: BC Managed Care – PPO | Admitting: Family Medicine

## 2012-01-31 ENCOUNTER — Ambulatory Visit (INDEPENDENT_AMBULATORY_CARE_PROVIDER_SITE_OTHER): Payer: BC Managed Care – PPO | Admitting: Family Medicine

## 2012-01-31 ENCOUNTER — Encounter: Payer: Self-pay | Admitting: Family Medicine

## 2012-01-31 VITALS — BP 120/64 | HR 76 | Temp 97.9°F | Wt 143.0 lb

## 2012-01-31 DIAGNOSIS — E119 Type 2 diabetes mellitus without complications: Secondary | ICD-10-CM

## 2012-01-31 DIAGNOSIS — E7800 Pure hypercholesterolemia, unspecified: Secondary | ICD-10-CM

## 2012-01-31 DIAGNOSIS — E039 Hypothyroidism, unspecified: Secondary | ICD-10-CM

## 2012-01-31 DIAGNOSIS — H9313 Tinnitus, bilateral: Secondary | ICD-10-CM | POA: Insufficient documentation

## 2012-01-31 DIAGNOSIS — H9319 Tinnitus, unspecified ear: Secondary | ICD-10-CM

## 2012-01-31 NOTE — Assessment & Plan Note (Signed)
Encouraged exercise, weight loss, healthy eating habits. Well controlled. Continue current medication.  

## 2012-01-31 NOTE — Patient Instructions (Signed)
Try nasal steroid to help decrease fluid in ear.s if tinnitus not imprpving consider referral to ENT for further eval.   Can try full dose of benadryl at night.

## 2012-01-31 NOTE — Progress Notes (Signed)
Subjective:    Patient ID: Valerie Vargas, female    DOB: 10/03/1945, 66 y.o.   MRN: [id number]  HPI 66 year old complicated pt with history including the following:  She was very healthy until 2001... fatigue, SOB, palpitations, syncope, choking.  Saw Pulmonologist, Cardiologist.  Dx Asthma, GERD.. But meds did not help. (Singulair)  Stress test showed tachycardia... Started on toprol.  Saw Dr Marcos Eke...dx then with sleep apnea.  Improved some.   Saw alternative med DR.: Dr. Ananias Pilgrim. Dx with vit D and other vit deficiency... Felt much better.  Also referred to [facility] (Dr. Virginia Crews) where dx with larynx swelling (vocal cord dysfunction)....coughing proceeds attacks.  She is on gabapentin to prevents this... Can take more if having an episode.  Usually every few months. Unknown trigger.  Endoscopy in last few months was normal.   Coughing: Has improved some with tessalon perles since last OV.  12/22: Was having regualr day, feeling well. When at [facility] she experienced... Seizure like episode, no LOC, lasted 5-10 min, no proceding symptoms (no CP, no SOB, no palpitations at that time) ( see Dr. Modesto Charon Neuro's note)  Finished shopping, although had a headache for a few days.  MRI/MRA showed: 1. No acute intracranial abnormality.  2. Negative high-resolution seizure imaging.  3. Tiny chronic lacunar infarct suspected in the right inferior  cerebellum, normal for age signal elsewhere... Had second that showed this was not an issue  EEG and EEG sleep deprived: no abnormality  She is already on gabapentin for other issues.  No further episodes.  Occ foggy brained and sometimes feels like walks to right.  TSH, B12, CMET nml in 07/2011.   Has labs for DM, chol done On 12/2011  Blood sugars running well at home,sometimes up and down.   Hypertension: Dr. Romero Belling has changed BP med increased cozaar to 50 mg daily for better BP control. Using medication without problems or  lightheadedness: none  Chest pain with exertion:none  Edema:none  Short of breath:none  Average home [id number]  Other issues:   Elevated Cholesterol: LDL on outside labs 12/2011.Marland Kitchen LDL 62, HDL on crestor Using medications without problems:None Muscle aches: None Diet compliance: moderate due to diet issues Exercise: Occ. Other complaints:  Diabetes:  A1C at 6.0 on glucophage Using medications without difficulties: Hypoglycemic episodes:? Hyperglycemic episodes:? Feet problems:sees podiatrist Blood Sugars averaging:not checking eye exam within last year: yes  Hypothyroid: TSH 2.3 last check 12/2011   Does have right calf pain. Worse with walking, better at rest.  Has seen podiatrist. She has noted some improvement with exercises, but only got new inserts yesterday.  Has noted "cricket sounds in both ears" in last few weeks. No heartbeat sound in ears. Has been having issues with allergies, moderate control. Some worse with rainy season. Dr. Romero Belling has changed BP med increased cozaar to 50 mg daily for better BP control. No change in supplements.    Review of Systems  Constitutional: Negative for fever and fatigue.  HENT: Negative for ear pain.   Eyes: Negative for pain.  Respiratory: Positive for cough. Negative for chest tightness and shortness of breath.   Cardiovascular: Negative for chest pain, palpitations and leg swelling.  Gastrointestinal: Negative for abdominal pain.  Genitourinary: Negative for dysuria.       Objective:   Physical Exam  Constitutional: Vital signs are normal. She appears well-developed and well-nourished. She is cooperative.  Non-toxic appearance. She does not appear ill. No distress.  HENT:  Head: Normocephalic.  Right Ear: Hearing, external ear and ear canal normal. Tympanic membrane is not erythematous, not retracted and not bulging. A middle ear effusion is present.  Left Ear: Hearing, external ear and ear canal normal. Tympanic membrane  is not erythematous, not retracted and not bulging. A middle ear effusion is present.  Nose: No mucosal edema or rhinorrhea. Right sinus exhibits no maxillary sinus tenderness and no frontal sinus tenderness. Left sinus exhibits no maxillary sinus tenderness and no frontal sinus tenderness.  Mouth/Throat: Uvula is midline, oropharynx is clear and moist and mucous membranes are normal.  Eyes: Conjunctivae, EOM and lids are normal. Pupils are equal, round, and reactive to light. No foreign bodies found.  Neck: Trachea normal and normal range of motion. Neck supple. Carotid bruit is not present. No mass and no thyromegaly present.  Cardiovascular: Normal rate, regular rhythm, S1 normal, S2 normal, normal heart sounds, intact distal pulses and normal pulses.  Exam reveals no gallop and no friction rub.   No murmur heard. Pulmonary/Chest: Effort normal and breath sounds normal. Not tachypneic. No respiratory distress. She has no decreased breath sounds. She has no wheezes. She has no rhonchi. She has no rales.  Abdominal: Soft. Normal appearance and bowel sounds are normal. There is no tenderness.  Neurological: She is alert.  Skin: Skin is warm, dry and intact. No rash noted.  Psychiatric: Her speech is normal and behavior is normal. Judgment and thought content normal. Her mood appears not anxious. Cognition and memory are normal. She does not exhibit a depressed mood.      Diabetic foot exam: Normal inspection No skin breakdown No calluses  Normal DP pulses Normal sensation to light touch and monofilament Nails normal     Assessment & Plan:

## 2012-01-31 NOTE — Assessment & Plan Note (Signed)
Try nasal steroid to help decrease fluid in ear.s if tinnitus not imrppving consider referral to ENT for further eval.

## 2012-01-31 NOTE — Assessment & Plan Note (Signed)
TSH stable on levothyroxine.

## 2012-01-31 NOTE — Assessment & Plan Note (Addendum)
At goal with lifestyle 

## 2012-02-29 ENCOUNTER — Other Ambulatory Visit: Payer: Self-pay | Admitting: Family Medicine

## 2012-04-06 ENCOUNTER — Ambulatory Visit (INDEPENDENT_AMBULATORY_CARE_PROVIDER_SITE_OTHER)
Admission: RE | Admit: 2012-04-06 | Discharge: 2012-04-06 | Disposition: A | Discharge status: Home / Self Care | Payer: BC Managed Care – PPO | Source: Ambulatory Visit | Attending: Family Medicine | Admitting: Family Medicine

## 2012-04-06 ENCOUNTER — Encounter: Payer: Self-pay | Admitting: Family Medicine

## 2012-04-06 ENCOUNTER — Ambulatory Visit (INDEPENDENT_AMBULATORY_CARE_PROVIDER_SITE_OTHER): Payer: BC Managed Care – PPO | Admitting: Family Medicine

## 2012-04-06 VITALS — BP 138/76 | HR 77 | Temp 98.2°F | Ht 63.0 in | Wt 146.8 lb

## 2012-04-06 DIAGNOSIS — M79605 Pain in left leg: Secondary | ICD-10-CM

## 2012-04-06 DIAGNOSIS — M545 Low back pain, unspecified: Secondary | ICD-10-CM

## 2012-04-06 NOTE — Progress Notes (Signed)
  Subjective:    Patient ID: Valerie Vargas, female    DOB: 12-18-1945, 66 y.o.   MRN: [id number]  HPI  66 year old female with history of previous low back pain years ago (told disc problems) presents with pain after being on her feet a while she feels B low back pain, worse on left low back. Ongoing for several months but worse in last few weeks. Pain is a sharp grabbing pain intermittently that is worse with activity better with rest.  Worse wit leaning forward. Notes if standing and walking, better if sits down. No new numbness, no new weakness in legs. She has some chronic ache worse in  Bilateral legs, worse in right leg.  She cannot function with this as much as previously.  No fever, no new incontinence. Using diclofenac prn 1 tab po BID prn, using once or twice daily.. She feels that it is not helping much.  No recent falls. No change in activity, no new exercise.  No recent X-rays. No sure if she ever had a MRI.  Has been seeing foot MD for orthotics for hamstring tightness, B leg pain.. Has gotten some better with new inserts, but not much.    Review of Systems  Constitutional: Negative for fever.  Respiratory: Negative for chest tightness.   Cardiovascular: Negative for chest pain.  Gastrointestinal: Negative for abdominal pain.  Neurological: Negative for syncope and weakness.       Objective:   Physical Exam  Constitutional: She is oriented to person, place, and time. She appears well-developed.  HENT:  Head: Normocephalic.  Eyes: Conjunctivae normal are normal. Pupils are equal, round, and reactive to light.  Neck: Normal range of motion. Neck supple. No thyromegaly present.  Cardiovascular: Normal rate and regular rhythm.   No murmur heard. Pulmonary/Chest: Effort normal and breath sounds normal. No respiratory distress. She has no wheezes. She has no rales. She exhibits no tenderness.  Musculoskeletal:       Thoracic back: She exhibits normal range of motion,  no tenderness, no bony tenderness and no edema.       Lumbar back: She exhibits normal range of motion, no tenderness, no bony tenderness and no edema.  Neurological: She is alert and oriented to person, place, and time.  Skin: Skin is warm and dry. No rash noted.  Psychiatric: She has a normal mood and affect.    Neg SLR, neg faber's strength 5/5 B lower extremety.  tight B hamstrings      Assessment & Plan:

## 2012-04-06 NOTE — Patient Instructions (Addendum)
Start diclofenac 75 mg twice daily.  Start home stretching and strengthening exercises twice daily.  We will call with back films.

## 2012-05-25 ENCOUNTER — Encounter: Payer: Self-pay | Admitting: Family Medicine

## 2012-05-28 ENCOUNTER — Encounter: Payer: Self-pay | Admitting: Family Medicine

## 2012-05-29 ENCOUNTER — Encounter: Payer: Self-pay | Admitting: Family Medicine

## 2012-06-01 ENCOUNTER — Encounter: Payer: Self-pay | Admitting: Family Medicine

## 2012-06-04 ENCOUNTER — Other Ambulatory Visit: Payer: Self-pay | Admitting: *Deleted

## 2012-06-04 MED ORDER — GLUCOSE BLOOD VI STRP: ORAL_STRIP | Status: DC

## 2012-07-19 ENCOUNTER — Encounter: Payer: BC Managed Care – PPO | Admitting: Family Medicine

## 2012-08-10 ENCOUNTER — Encounter: Payer: Self-pay | Admitting: Family Medicine

## 2012-08-10 ENCOUNTER — Ambulatory Visit (INDEPENDENT_AMBULATORY_CARE_PROVIDER_SITE_OTHER): Payer: PRIVATE HEALTH INSURANCE | Admitting: Family Medicine

## 2012-08-10 VITALS — BP 120/72 | HR 79 | Temp 98.1°F | Ht 63.0 in | Wt 148.8 lb

## 2012-08-10 DIAGNOSIS — E7800 Pure hypercholesterolemia, unspecified: Secondary | ICD-10-CM

## 2012-08-10 DIAGNOSIS — E119 Type 2 diabetes mellitus without complications: Secondary | ICD-10-CM

## 2012-08-10 DIAGNOSIS — Z0000 Encounter for general adult medical examination without abnormal findings: Secondary | ICD-10-CM

## 2012-08-10 DIAGNOSIS — E039 Hypothyroidism, unspecified: Secondary | ICD-10-CM

## 2012-08-10 MED ORDER — LOSARTAN POTASSIUM 50 MG PO TABS: 50.0000 mg | ORAL_TABLET | Freq: Every day | ORAL | Status: DC

## 2012-08-10 NOTE — Assessment & Plan Note (Signed)
Excellent control. Encouraged exercise, weight loss, healthy eating habits.  

## 2012-08-10 NOTE — Assessment & Plan Note (Signed)
Slight increase in TSH... Followed by Dr. Romero Belling.Marland Kitchen Recent med increase. Due for re-eval in several weeks.

## 2012-08-10 NOTE — Assessment & Plan Note (Signed)
At goal on current meds. 

## 2012-08-10 NOTE — Patient Instructions (Addendum)
Last colonscopy 11/2000.. Call Dr. Jacinto Reap at [facility] for repeat ASAP. Look into shingles and Tdap coverage. Call about mammogram on your own in 10/2011. Follow up 30 min in 6 months, fasting labs at work prior.

## 2012-08-10 NOTE — Progress Notes (Signed)
Subjective:    Patient ID: Valerie Vargas, female    DOB: [birth year], 67 y.o.   MRN: [id number]  HPI  67 year old complicated pt  here for annual exam.  With history including the following:  She was very healthy until 2001... fatigue, SOB, palpitations, syncope, choking.  Saw Pulmonologist, Cardiologist.  Dx Asthma, GERD.. But meds did not help. (Singulair)  Stress test showed tachycardia... Started on toprol.  Saw Dr Marcos Eke...dx then with sleep apnea.  Improved some.  Saw alternative med DR.: Dr. Ananias Pilgrim. Dx with vit D and other vit deficiency... Felt much better.  Also referred to [facility] (Dr. Virginia Crews) where dx with larynx swelling (vocal cord dysfunction)....coughing proceeds attacks.  She is on gabapentin to prevents this... Can take more if having an episode.  Usually every few months. Unknown trigger.  Endoscopy in last few months was normal.   Coughing:  Has improved some with tessalon perles since last OV.   12/22: Was having regualr day, feeling well. When at [facility] she experienced... Seizure like episode, no LOC, lasted 5-10 min, no proceding symptoms (no CP, no SOB, no palpitations at that time) ( see Dr. Modesto Charon Neuro's note)  Finished shopping, although had a headache for a few days.   MRI/MRA showed: 1. No acute intracranial abnormality.  2. Negative high-resolution seizure imaging.  3. Tiny chronic lacunar infarct suspected in the right inferior  cerebellum, normal for age signal elsewhere... Had second that showed this was not an issue  EEG and EEG sleep deprived: no abnormality  She is already on gabapentin for other issues.    Hypertension: Dr. Romero Belling has changed BP med increased cozaar to 50 mg daily for better BP control.  Using medication without problems or lightheadedness: none  Chest pain with exertion:none  Edema:none  Short of breath:none  Average home WUJ:[id number]  Other issues:   Elevated Cholesterol: LDL on outside labs 07/2012.Marland Kitchen LDL 95, HDL   53on crestor  Using medications without problems:None  Muscle aches: None  Diet compliance: moderate due to diet issues  Exercise: Occ.  Other complaints:   Diabetes: A1C at 6.2 on glucophage  Using medications without difficulties:  Hypoglycemic episodes:?  Hyperglycemic episodes:?  Feet problems:sees podiatrist  Blood Sugars averaging:not checking  eye exam within last year: yes   Hypothyroid: TSH 4.090 last check 07/2012 Dr. Romero Belling has increased her medication.    Review of Systems  Constitutional: Negative for fever and fatigue.  HENT: Negative for ear pain.   Eyes: Negative for pain.  Respiratory: Negative for chest tightness and shortness of breath.   Cardiovascular: Negative for chest pain, palpitations and leg swelling.  Gastrointestinal: Negative for abdominal pain.  Genitourinary: Negative for dysuria.       Objective:   Physical Exam  Constitutional: Vital signs are normal. She appears well-developed and well-nourished. She is cooperative.  Non-toxic appearance. She does not appear ill. No distress.  HENT:  Head: Normocephalic.  Right Ear: Hearing, tympanic membrane, external ear and ear canal normal.  Left Ear: Hearing, tympanic membrane, external ear and ear canal normal.  Nose: Nose normal.  Eyes: Conjunctivae normal, EOM and lids are normal. Pupils are equal, round, and reactive to light. No foreign bodies found.  Neck: Trachea normal and normal range of motion. Neck supple. Carotid bruit is not present. No mass and no thyromegaly present.  Cardiovascular: Normal rate, regular rhythm, S1 normal, S2 normal, normal heart sounds and intact distal pulses.  Exam reveals no gallop.  No murmur heard. Pulmonary/Chest: Effort normal and breath sounds normal. No respiratory distress. She has no wheezes. She has no rhonchi. She has no rales.  Abdominal: Soft. Normal appearance and bowel sounds are normal. She exhibits no distension, no fluid wave, no abdominal bruit and  no mass. There is no hepatosplenomegaly. There is no tenderness. There is no rebound, no guarding and no CVA tenderness. No hernia.  Genitourinary: No breast swelling, tenderness, discharge or bleeding. Pelvic exam was performed with patient supine.       Vag exam not indicated.  Lymphadenopathy:    She has no cervical adenopathy.    She has no axillary adenopathy.  Neurological: She is alert. She has normal strength. No cranial nerve deficit or sensory deficit.  Skin: Skin is warm, dry and intact. No rash noted.  Psychiatric: Her speech is normal and behavior is normal. Judgment normal. Her mood appears not anxious. Cognition and memory are normal. She does not exhibit a depressed mood.      Diabetic foot exam: Normal inspection No skin breakdown No calluses  Normal DP pulses Normal sensation to light touch and monofilament Nails normal     Assessment & Plan:  The patient's preventative maintenance and recommended screening tests for an annual wellness exam were reviewed in full today. Brought up to date unless services declined.  Counselled on the importance of diet, exercise, and its role in overall health and mortality. The patient's FH and SH was reviewed, including their home life, tobacco status, and drug and alcohol status.   Colon: 11/2000 nml, int hem.. Due for 10 year repeat.Nonsmoker DEXA:  Normal 02/2011, can repeat in 5 years. Mammogram last 10/2011, due now PAP not indicated, DVE not indicated total hysterecotmy Vaccine: ? TDap, PNA uptodate, shingles never had, flu uptodate at work

## 2012-09-26 ENCOUNTER — Other Ambulatory Visit: Payer: Self-pay

## 2012-09-26 DIAGNOSIS — Z1231 Encounter for screening mammogram for malignant neoplasm of breast: Secondary | ICD-10-CM

## 2012-10-03 ENCOUNTER — Encounter: Payer: Self-pay | Admitting: *Deleted

## 2012-10-15 ENCOUNTER — Ambulatory Visit: Payer: PRIVATE HEALTH INSURANCE

## 2012-10-18 ENCOUNTER — Other Ambulatory Visit: Payer: Self-pay | Admitting: *Deleted

## 2012-10-18 MED ORDER — ONETOUCH ULTRASOFT LANCETS MISC: Status: DC

## 2012-10-31 ENCOUNTER — Ambulatory Visit
Admission: RE | Admit: 2012-10-31 | Discharge: 2012-10-31 | Disposition: A | Discharge status: Home / Self Care | Payer: PRIVATE HEALTH INSURANCE | Source: Ambulatory Visit

## 2012-10-31 DIAGNOSIS — Z1231 Encounter for screening mammogram for malignant neoplasm of breast: Secondary | ICD-10-CM

## 2012-12-07 ENCOUNTER — Ambulatory Visit (INDEPENDENT_AMBULATORY_CARE_PROVIDER_SITE_OTHER): Payer: PRIVATE HEALTH INSURANCE | Admitting: Cardiovascular Disease

## 2012-12-07 VITALS — BP 144/66 | HR 72 | Resp 16 | Ht 63.0 in | Wt 145.0 lb

## 2012-12-07 DIAGNOSIS — I472 Ventricular tachycardia, unspecified: Secondary | ICD-10-CM

## 2012-12-07 DIAGNOSIS — J45909 Unspecified asthma, uncomplicated: Secondary | ICD-10-CM

## 2012-12-07 DIAGNOSIS — I2510 Atherosclerotic heart disease of native coronary artery without angina pectoris: Secondary | ICD-10-CM

## 2012-12-07 DIAGNOSIS — G4733 Obstructive sleep apnea (adult) (pediatric): Secondary | ICD-10-CM

## 2012-12-07 DIAGNOSIS — E119 Type 2 diabetes mellitus without complications: Secondary | ICD-10-CM

## 2012-12-07 DIAGNOSIS — I10 Essential (primary) hypertension: Secondary | ICD-10-CM

## 2012-12-07 DIAGNOSIS — E782 Mixed hyperlipidemia: Secondary | ICD-10-CM

## 2012-12-07 DIAGNOSIS — R55 Syncope and collapse: Secondary | ICD-10-CM

## 2012-12-07 DIAGNOSIS — I4729 Other ventricular tachycardia: Secondary | ICD-10-CM

## 2012-12-10 ENCOUNTER — Encounter: Payer: Self-pay | Admitting: Cardiovascular Disease

## 2012-12-10 DIAGNOSIS — I10 Essential (primary) hypertension: Secondary | ICD-10-CM | POA: Insufficient documentation

## 2012-12-10 DIAGNOSIS — I2510 Atherosclerotic heart disease of native coronary artery without angina pectoris: Secondary | ICD-10-CM | POA: Insufficient documentation

## 2012-12-10 DIAGNOSIS — E782 Mixed hyperlipidemia: Secondary | ICD-10-CM | POA: Insufficient documentation

## 2012-12-10 DIAGNOSIS — I152 Hypertension secondary to endocrine disorders: Secondary | ICD-10-CM | POA: Insufficient documentation

## 2012-12-10 NOTE — Progress Notes (Signed)
Patient ID: Valerie Vargas, female   DOB: October 05, 1945, 67 y.o.   MRN: [id number]   Reason for office visit  Valerie Vargas returns in followup after wearing a 30 day event monitor for palpitations. She is also being followed for hypertension, diabetes mellitus, hyperlipidemia and a previous history of recurrent syncope without recurrence in the last 7 years.  She continues to complain of palpitations that occur almost only at night. She is still very distressed about the progression of her mother's illness ureter mother is in severe pain and is on home hospice. Valerie Vargas wants to keep her at home as long as possible but is beginning to understand the limitations of her physical resources.  Allergies  Allergen Reactions  . Codeine   . Hctz (Hydrochlorothiazide)   . Penicillins Rash  . Sulfa Antibiotics Rash    Current Outpatient Prescriptions  Medication Sig Dispense Refill  . 5-Hydroxytryptophan (5-HTP) 100 MG CAPS Take 1 capsule by mouth 3 (three) times daily.        Marland Kitchen albuterol (PROVENTIL,VENTOLIN) 90 MCG/ACT inhaler Inhale 2 puffs into the lungs as needed.        . ALPRAZolam (XANAX) 0.5 MG tablet Take 0.5 mg by mouth as needed.        . Ascorbic Acid (VITAMIN C) 1000 MG tablet Take 1,000 mg by mouth daily.        Marland Kitchen aspirin 81 MG tablet Take 81 mg by mouth daily.        . Cholecalciferol (VITAMIN D3) 1000 UNITS CAPS Take 1 capsule by mouth daily.        . Cinnamon 500 MG capsule Take 500 mg by mouth 2 (two) times daily.        . Coenzyme Q10 (CO Q 10) 100 MG CAPS Take 400 mg by mouth daily.        . CRESTOR 10 MG tablet TAKE 1 TABLET BY MOUTH EVERY DAY  30 each  11  . diclofenac (VOLTAREN) 75 MG EC tablet Take 75 mg by mouth. Prn      . Diphenhydramine-PE-APAP (BENADRYL ALLERGY/SINUS HEADACH) 12.5-5-325 MG TABS Take 0.5 tablets by mouth at bedtime.        Marland Kitchen estradiol (ESTRACE) 0.1 MG/GM vaginal cream Place 0.5 Applicatorfuls vaginally once a week. Use once a week  42.5 g  1  .  gabapentin (NEURONTIN) 100 MG tablet Take 300 mg by mouth 3 (three) times daily.        Marland Kitchen glucose blood test strip Use up to 4 times daily as needed dx 250.00  100 each  6  . Guaifenesin 1200 MG TB12 Take by mouth as needed.       Marland Kitchen L-Theanine 100 MG CAPS Take 2 tablets by mouth 3 (three) times daily.        . Lancets (ONETOUCH ULTRASOFT) lancets Use to test blood sugar up to 4 times daily dx 250.00  200 each  3  . levothyroxine (SYNTHROID, LEVOTHROID) 50 MCG tablet Take 75 mcg by mouth daily.       Marland Kitchen liothyronine (CYTOMEL) 5 MCG tablet Take 5 mcg by mouth daily.        Marland Kitchen loratadine (CLARITIN) 10 MG tablet Take 10 mg by mouth daily.      Marland Kitchen losartan (COZAAR) 50 MG tablet Take 1 tablet (50 mg total) by mouth daily.  30 tablet  11  . MAGNESIUM GLYCINATE PLUS PO Take 1 capsule by mouth 2 (two) times daily.        Marland Kitchen  metFORMIN (GLUCOPHAGE) 500 MG tablet Take 500 mg by mouth 2 (two) times daily with a meal.       . metoprolol (TOPROL-XL) 50 MG 24 hr tablet Take 50 mg by mouth daily.        . OMEGA-3 1000 MG CAPS Take 1,000 mg by mouth daily.        Marland Kitchen omeprazole (PRILOSEC) 40 MG capsule Take 40 mg by mouth 2 (two) times daily.      Marland Kitchen pyridoxine (B-6) 200 MG tablet Take 200 mg by mouth daily.        . Thiamine HCl (VITAMIN B-1) 250 MG tablet Take 250 mg by mouth daily.        . vitamin B-12 (CYANOCOBALAMIN) 1000 MCG tablet Take 1,000 mcg by mouth daily.         No current facility-administered medications for this visit.    Past Medical History  Diagnosis Date  . Cough   . Chronic interstitial cystitis   . Unspecified hypothyroidism   . Allergy, unspecified not elsewhere classified   . Paroxysmal ventricular tachycardia   . Type II or unspecified type diabetes mellitus without mention of complication, not stated as uncontrolled   . Unspecified asthma   . Irritable bowel syndrome   . Esophageal reflux   . Obstructive sleep apnea (adult) (pediatric)   . Ventricular tachycardia   . Nonocclusive  coronary atherosclerosis of native coronary artery   . Hypertension   . Hypertriglyceridemia   . H/O syncope 2007    No recurrence;Tilt table 09/23/05-negative    Past Surgical History  Procedure Laterality Date  . Cholecystectomy  07/2000  . Abdominal hysterectomy  11/1979    for endometriosis  . Esophagogastroduodenoscopy endoscopy  2010    Family History  Problem Relation Age of Onset  . Diabetes Mother   . Breast cancer Mother   . Arthritis Mother   . Diabetes Father   . Stomach cancer Father   . Heart disease Father   . Leukemia Brother     History   Social History  . Marital Status: Married    Spouse Name: N/A    Number of Children: N/A  . Years of Education: N/A   Occupational History  . Not on file.   Social History Main Topics  . Smoking status: Never Smoker   . Smokeless tobacco: Never Used  . Alcohol Use: No  . Drug Use: Not on file  . Sexually Active: Not on file   Other Topics Concern  . Not on file   Social History Narrative  . No narrative on file    Review of systems: The patient specifically denies any chest pain at rest exertion, dyspnea at rest or with exertion, orthopnea, paroxysmal nocturnal dyspnea, syncope, focal neurological deficits, intermittent claudication, lower extremity edema, unexplained weight gain, cough, hemoptysis or wheezing.   PHYSICAL EXAM BP 144/66  Pulse 72  Resp 16  Ht 5\' 3"  (1.6 m)  Wt 145 lb (65.772 kg)  BMI 25.69 kg/m2  General: Alert, oriented x3, no distress Head: no evidence of trauma, PERRL, EOMI, no exophtalmos or lid lag, no myxedema, no xanthelasma; normal ears, nose and oropharynx Neck: normal jugular venous pulsations and no hepatojugular reflux; brisk carotid pulses without delay and no carotid bruits Chest: clear to auscultation, no signs of consolidation by percussion or palpation, normal fremitus, symmetrical and full respiratory excursions Cardiovascular: normal position and quality of the apical  impulse, regular rhythm, normal first and second heart sounds,  no murmurs, rubs or gallops Abdomen: no tenderness or distention, no masses by palpation, no abnormal pulsatility or arterial bruits, normal bowel sounds, no hepatosplenomegaly Extremities: no clubbing, cyanosis or edema; 2+ radial, ulnar and brachial pulses bilaterally; 2+ right femoral, posterior tibial and dorsalis pedis pulses; 2+ left femoral, posterior tibial and dorsalis pedis pulses; no subclavian or femoral bruits Neurological: grossly nonfocal   EKG: Normal sinus rhythm, normal ECG    Lipid Panel     Component Value Date/Time   CHOL 148 12/28/2011   TRIG 150 12/28/2011   HDL 56 12/28/2011   LDLCALC 62 12/28/2011    BMET    Component Value Date/Time   NA 137 12/28/2011   NA 138 09/06/2011 0817   K 4.2 12/28/2011   CL 104 09/06/2011 0817   CO2 27 09/06/2011 0817   GLUCOSE 94 09/06/2011 0817   BUN 18 12/28/2011   BUN 13 09/06/2011 0817   CREATININE 0.9 12/28/2011   CREATININE 0.9 09/06/2011 0817   CALCIUM 9.6 09/06/2011 0817     ASSESSMENT AND PLAN  HTN (hypertension) Her blood pressure is a little high today but she is clearly distressed over her mother's progressive medical illness. No adjustments are made to her medications today to  CAD (coronary artery disease) Minor coronary atherosclerosis without stenoses on cardiac catheterization in 2003, normal myocardial perfusion study in 2007.  Mixed hyperlipidemia Satisfactory lipid profile on current regimen. Has a history of elevated total cholesterol and triglycerides in the past.  Syncope Unexplained repeat syncope occurred in 2007 and has not recurred. She had a negative tilt table test at that time but has improved after treatment with beta blockers. Brief nonsustained ventricular tachycardia was noted during a treadmill stress test performed then, but nuclear perfusion images were normal and coronary angiography did not show evidence of significant coronary  obstruction. Her current event monitor shows that her palpitations correlate with sinus tachycardia.  She has preserved left ventricular systolic function.  DM Excellent control on minimal therapy  OBSTRUCTIVE SLEEP APNEA She reports 100% compliance with CPAP  Her palpitations appear to be related to sinus tachycardia and I believe her preoccupation with her mother's progressive medical illness may be the reason for this.    Junious Silk, MD, [facility] [phone] office [phone] pager

## 2012-12-10 NOTE — Assessment & Plan Note (Addendum)
Minor coronary atherosclerosis without stenoses on cardiac catheterization in 2003, normal myocardial perfusion study in 2007.

## 2012-12-10 NOTE — Assessment & Plan Note (Addendum)
Her blood pressure is a little high today but she is clearly distressed over her mother's progressive medical illness. No adjustments are made to her medications today to

## 2012-12-10 NOTE — Assessment & Plan Note (Signed)
Excellent control on minimal therapy 

## 2012-12-10 NOTE — Assessment & Plan Note (Signed)
She reports 100% compliance with CPAP

## 2012-12-10 NOTE — Assessment & Plan Note (Signed)
Satisfactory lipid profile on current regimen. Has a history of elevated total cholesterol and triglycerides in the past.

## 2012-12-10 NOTE — Assessment & Plan Note (Signed)
Unexplained repeat syncope occurred in 2007 and has not recurred. She had a negative tilt table test at that time but has improved after treatment with beta blockers. Brief nonsustained ventricular tachycardia was noted during a treadmill stress test performed then, but nuclear perfusion images were normal and coronary angiography did not show evidence of significant coronary obstruction. Her current event monitor shows that her palpitations correlate with sinus tachycardia.  She has preserved left ventricular systolic function.

## 2012-12-20 ENCOUNTER — Telehealth: Payer: Self-pay | Admitting: Family Medicine

## 2012-12-20 DIAGNOSIS — Z0279 Encounter for issue of other medical certificate: Secondary | ICD-10-CM

## 2012-12-20 NOTE — Telephone Encounter (Signed)
Pt dropped off FMLA forms for the care of her mother. I have placed them on your desk. Thank you.

## 2012-12-20 NOTE — Telephone Encounter (Signed)
In dr Ermalene Searing in box

## 2013-02-04 LAB — BASIC METABOLIC PANEL
BUN: 17 mg/dL (ref 4–21)
Creatinine: 1.1 mg/dL (ref 0.5–1.1)
Glucose: 94 mg/dL
Sodium: 141 mmol/L (ref 137–147)

## 2013-02-04 LAB — CBC AND DIFFERENTIAL: HCT: 35 % — AB (ref 36–46)

## 2013-02-04 LAB — HEPATIC FUNCTION PANEL
AST: 21 U/L (ref 13–35)
Bilirubin, Total: 0.3 mg/dL

## 2013-02-04 LAB — LIPID PANEL
LDL Cholesterol: 83 mg/dL
Triglycerides: 201 mg/dL — AB (ref 40–160)

## 2013-02-04 LAB — HEMOGLOBIN A1C: Hgb A1c MFr Bld: 6.1 % — AB (ref 4.0–6.0)

## 2013-02-08 ENCOUNTER — Ambulatory Visit (INDEPENDENT_AMBULATORY_CARE_PROVIDER_SITE_OTHER): Payer: PRIVATE HEALTH INSURANCE | Admitting: Family Medicine

## 2013-02-08 ENCOUNTER — Encounter: Payer: Self-pay | Admitting: Family Medicine

## 2013-02-08 VITALS — BP 118/78 | HR 73 | Temp 97.9°F | Ht 63.0 in | Wt 143.8 lb

## 2013-02-08 DIAGNOSIS — H9312 Tinnitus, left ear: Secondary | ICD-10-CM

## 2013-02-08 DIAGNOSIS — R05 Cough: Secondary | ICD-10-CM

## 2013-02-08 DIAGNOSIS — R059 Cough, unspecified: Secondary | ICD-10-CM

## 2013-02-08 DIAGNOSIS — E119 Type 2 diabetes mellitus without complications: Secondary | ICD-10-CM

## 2013-02-08 DIAGNOSIS — H9319 Tinnitus, unspecified ear: Secondary | ICD-10-CM

## 2013-02-08 DIAGNOSIS — E7800 Pure hypercholesterolemia, unspecified: Secondary | ICD-10-CM

## 2013-02-08 DIAGNOSIS — H6122 Impacted cerumen, left ear: Secondary | ICD-10-CM

## 2013-02-08 DIAGNOSIS — H6120 Impacted cerumen, unspecified ear: Secondary | ICD-10-CM | POA: Insufficient documentation

## 2013-02-08 MED ORDER — BENZONATATE 100 MG PO CAPS: 100.0000 mg | ORAL_CAPSULE | Freq: Two times a day (BID) | ORAL | Status: DC | PRN

## 2013-02-08 NOTE — Patient Instructions (Addendum)
Follow up for CPX in 6 months with fasting labs prior.

## 2013-02-08 NOTE — Progress Notes (Signed)
Subjective:    Patient ID: Valerie Vargas, female    DOB: 10-25-1945, 67 y.o.   MRN: [id number]  HPI 67 year old female presents for 6 month follow up.  Her husband has ben sick lately. His doctors are concerned about lymphoma.  She has been under stress  Hypertension: Good control on current regimen losartan and metoprolol. Using medication without problems or lightheadedness: none  Chest pain with exertion:none  Edema:none  Short of breath:none  Average home BPs: stable  Other issues:   Elevated Cholesterol: Well controlled except for trigs  On crestor Lab Results  Component Value Date   CHOL 176 02/04/2013   HDL 53 02/04/2013   LDLCALC 83 02/04/2013   TRIG 201* 02/04/2013   Has not been taking fish oil regualrly Using medications without problems:None  Muscle aches: None  Diet compliance: moderate due to diet issues  Exercise: Occ.  Other complaints:   Diabetes: On glucophage  Lab Results  Component Value Date   HGBA1C 6.1* 02/04/2013  Using medications without difficulties:  Hypoglycemic episodes:?  Hyperglycemic episodes:?  Feet problems:sees podiatrist  Blood Sugars averaging:not checking  eye exam within last year: yes   Hypothyroid: TSH 4.090 last check 07/2012 Dr. Romero Belling has increased her medication.  She requests refill of benzonatate 100 1 tab po BID prn.     Review of Systems  Constitutional: Negative for fever and fatigue.  HENT: Negative for ear pain.   Eyes: Negative for pain.  Respiratory: Negative for chest tightness and shortness of breath.   Cardiovascular: Negative for chest pain, palpitations and leg swelling.  Gastrointestinal: Negative for abdominal pain.  Genitourinary: Negative for dysuria.       Objective:   Physical Exam  Constitutional: Vital signs are normal. She appears well-developed and well-nourished. She is cooperative.  Non-toxic appearance. She does not appear ill. No distress.  HENT:  Head: Normocephalic.  Right Ear:  Hearing, tympanic membrane, external ear and ear canal normal. Tympanic membrane is not erythematous, not retracted and not bulging.  Left Ear: Hearing, tympanic membrane, external ear and ear canal normal. Tympanic membrane is not erythematous, not retracted and not bulging.  Nose: No mucosal edema or rhinorrhea. Right sinus exhibits no maxillary sinus tenderness and no frontal sinus tenderness. Left sinus exhibits no maxillary sinus tenderness and no frontal sinus tenderness.  Mouth/Throat: Uvula is midline, oropharynx is clear and moist and mucous membranes are normal.  Eyes: Conjunctivae, EOM and lids are normal. Pupils are equal, round, and reactive to light. No foreign bodies found.  Neck: Trachea normal and normal range of motion. Neck supple. Carotid bruit is not present. No mass and no thyromegaly present.  Cardiovascular: Normal rate, regular rhythm, S1 normal, S2 normal, normal heart sounds, intact distal pulses and normal pulses.  Exam reveals no gallop and no friction rub.   No murmur heard. Pulmonary/Chest: Effort normal and breath sounds normal. Not tachypneic. No respiratory distress. She has no decreased breath sounds. She has no wheezes. She has no rhonchi. She has no rales.  Abdominal: Soft. Normal appearance and bowel sounds are normal. There is no tenderness.  Neurological: She is alert.  Skin: Skin is warm, dry and intact. No rash noted.  Psychiatric: Her speech is normal and behavior is normal. Judgment and thought content normal. Her mood appears not anxious. Cognition and memory are normal. She does not exhibit a depressed mood.    Diabetic foot exam: Normal inspection No skin breakdown No calluses  Normal DP pulses  Normal sensation to light touch and monofilament Nails normal       Assessment & Plan:

## 2013-02-15 NOTE — Assessment & Plan Note (Signed)
Good current control.

## 2013-02-15 NOTE — Assessment & Plan Note (Signed)
Well controlled. Continue current medication.  

## 2013-02-15 NOTE — Assessment & Plan Note (Signed)
irrigated

## 2013-02-15 NOTE — Assessment & Plan Note (Signed)
Due to swallowing dysfunction. Benzonatate refilled.

## 2013-02-25 ENCOUNTER — Telehealth: Payer: Self-pay

## 2013-02-25 NOTE — Telephone Encounter (Signed)
Pt wants to get vaccine here.  OK to schedule?

## 2013-02-25 NOTE — Telephone Encounter (Signed)
Pt left v/m; pt has checked with insurance co and wants to schedule a shingles vaccine. Pt request cb.

## 2013-02-26 NOTE — Telephone Encounter (Signed)
Okay to schedule

## 2013-02-27 NOTE — Telephone Encounter (Signed)
Advised patient, nurse visit scheduled.

## 2013-03-06 ENCOUNTER — Ambulatory Visit (INDEPENDENT_AMBULATORY_CARE_PROVIDER_SITE_OTHER): Payer: PRIVATE HEALTH INSURANCE | Admitting: *Deleted

## 2013-03-06 DIAGNOSIS — Z23 Encounter for immunization: Secondary | ICD-10-CM

## 2013-03-06 DIAGNOSIS — Z2911 Encounter for prophylactic immunotherapy for respiratory syncytial virus (RSV): Secondary | ICD-10-CM

## 2013-03-15 LAB — HM COLONOSCOPY: HM Colonoscopy: NORMAL

## 2013-03-25 ENCOUNTER — Other Ambulatory Visit: Payer: Self-pay | Admitting: Family Medicine

## 2013-03-29 ENCOUNTER — Other Ambulatory Visit: Payer: Self-pay | Admitting: Cardiovascular Disease

## 2013-03-29 NOTE — Telephone Encounter (Signed)
Rx was sent to pharmacy electronically. 

## 2013-07-26 ENCOUNTER — Other Ambulatory Visit (INDEPENDENT_AMBULATORY_CARE_PROVIDER_SITE_OTHER): Payer: Medicare Other

## 2013-07-26 ENCOUNTER — Telehealth: Payer: Self-pay | Admitting: Family Medicine

## 2013-07-26 DIAGNOSIS — E039 Hypothyroidism, unspecified: Secondary | ICD-10-CM

## 2013-07-26 DIAGNOSIS — E7800 Pure hypercholesterolemia, unspecified: Secondary | ICD-10-CM

## 2013-07-26 DIAGNOSIS — E782 Mixed hyperlipidemia: Secondary | ICD-10-CM

## 2013-07-26 DIAGNOSIS — E119 Type 2 diabetes mellitus without complications: Secondary | ICD-10-CM

## 2013-07-26 LAB — LIPID PANEL
CHOLESTEROL: 188 mg/dL (ref 0–200)
HDL: 52.9 mg/dL (ref >39.00)
TRIGLYCERIDES: 212 mg/dL — AB (ref 0.0–149.0)
Total CHOL/HDL Ratio: 4
VLDL: 42.4 mg/dL — ABNORMAL HIGH (ref 0.0–40.0)

## 2013-07-26 LAB — COMPREHENSIVE METABOLIC PANEL
ALT: 24 U/L (ref 0–35)
AST: 25 U/L (ref 0–37)
Albumin: 4.6 g/dL (ref 3.5–5.2)
Alkaline Phosphatase: 51 U/L (ref 39–117)
BILIRUBIN TOTAL: 0.6 mg/dL (ref 0.3–1.2)
BUN: 15 mg/dL (ref 6–23)
CALCIUM: 9.7 mg/dL (ref 8.4–10.5)
CHLORIDE: 106 meq/L (ref 96–112)
CO2: 28 meq/L (ref 19–32)
Creatinine, Ser: 1 mg/dL (ref 0.4–1.2)
GFR: 58.41 mL/min — AB (ref >60.00)
GLUCOSE: 95 mg/dL (ref 70–99)
Potassium: 4.6 mEq/L (ref 3.5–5.1)
SODIUM: 140 meq/L (ref 135–145)
TOTAL PROTEIN: 7.4 g/dL (ref 6.0–8.3)

## 2013-07-26 LAB — HEMOGLOBIN A1C: HEMOGLOBIN A1C: 6.2 % (ref 4.6–6.5)

## 2013-07-26 LAB — LDL CHOLESTEROL, DIRECT: LDL DIRECT: 105.6 mg/dL

## 2013-07-26 LAB — TSH: TSH: 0.18 u[IU]/mL — ABNORMAL LOW (ref 0.35–5.50)

## 2013-07-26 NOTE — Telephone Encounter (Signed)
Message copied by Jinny Sanders on Fri Jul 26, 2013  2:06 PM ------      Message from: Ellamae Sia      Created: Thu Jul 25, 2013  5:26 PM      Regarding: Lab orders for Friday, 1.9.15       Patient is scheduled for CPX labs, please order future labs, Thanks , Terri       ------

## 2013-07-29 ENCOUNTER — Encounter: Payer: Self-pay | Admitting: Family Medicine

## 2013-07-30 ENCOUNTER — Telehealth: Payer: Self-pay

## 2013-07-30 NOTE — Telephone Encounter (Signed)
Relevant patient education assigned to patient using Emmi. ° °

## 2013-08-01 ENCOUNTER — Encounter: Payer: Self-pay | Admitting: Cardiovascular Disease

## 2013-08-01 ENCOUNTER — Ambulatory Visit (INDEPENDENT_AMBULATORY_CARE_PROVIDER_SITE_OTHER): Payer: Medicare Other | Admitting: Cardiovascular Disease

## 2013-08-01 VITALS — BP 154/60 | HR 76 | Ht 63.5 in | Wt 147.6 lb

## 2013-08-01 DIAGNOSIS — R0602 Shortness of breath: Secondary | ICD-10-CM

## 2013-08-01 NOTE — Patient Instructions (Signed)
Your physician recommends that you schedule a follow-up appointment in: 1 year with Dr Timmie Foerster  Your physician has requested that you have an exercise tolerance test. For further information please visit [email or web address]. Please also follow instruction sheet, as given.

## 2013-08-02 ENCOUNTER — Encounter: Payer: Self-pay | Admitting: Cardiovascular Disease

## 2013-08-05 NOTE — Progress Notes (Signed)
Patient ID: Valerie Vargas, female   DOB: October 25, 1945, 68 y.o.   MRN: [id number]      Reason for office visit History of syncope, nonobstructive coronary artery disease, multiple coronary risk factors  Valerie Vargas has not had any recent cardiovascular complaints. She remains deeply invested in daily care for her mother, Valerie Vargas, who is on home hospice. Her mental status waxes and wanes, but Valerie Vargas thinks it is well worth it because "sometimes her mother comes back". She has been paying less attention to her own health and is not exercising as much. She denies angina, dyspnea or syncope. She had a couple of syncopal events in 2007 that were not explained by her rhythm monitoring or tilt table testing. These have not recurred in 8 years. She had minor coronary artery disease by an angiogram performed in 2003. She has well-controlled hypertension, mixed hyperlipidemia and type 2 diabetes mellitus. Her blood pressure at home is consistently lower than when she comes to see [place] in the office. She states that a typical blood pressure reading will be in the 130s over 70s.  While her cholesterol and LDL levels remain favorable, they have clearly deteriorated from 2013 and her triglycerides are now frankly high. This reflects less physical activity and attention to her own care, I think.   Allergies  Allergen Reactions  . Codeine   . Hctz [Hydrochlorothiazide]   . Penicillins Rash  . Sulfa Antibiotics Rash    Current Outpatient Prescriptions  Medication Sig Dispense Refill  . 5-Hydroxytryptophan (5-HTP) 100 MG CAPS Take 1 capsule by mouth 3 (three) times daily.        Marland Kitchen albuterol (PROVENTIL,VENTOLIN) 90 MCG/ACT inhaler Inhale 2 puffs into the lungs as needed.        . ALPRAZolam (XANAX) 0.5 MG tablet Take 0.5 mg by mouth as needed.        . Ascorbic Acid (VITAMIN C) 1000 MG tablet Take 1,000 mg by mouth daily.        Marland Kitchen aspirin 81 MG tablet Take 81 mg by mouth daily.        . benzonatate  (TESSALON) 100 MG capsule Take 1 capsule (100 mg total) by mouth 2 (two) times daily as needed for cough.  60 capsule  3  . Cholecalciferol (VITAMIN D3) 1000 UNITS CAPS Take 1 capsule by mouth daily.        . Cinnamon 500 MG capsule Take 500 mg by mouth 2 (two) times daily.        . Coenzyme Q10 (CO Q 10) 100 MG CAPS Take 400 mg by mouth daily.        . CRESTOR 10 MG tablet TAKE 1 TABLET BY MOUTH EVERY DAY  30 tablet  5  . diclofenac (VOLTAREN) 75 MG EC tablet Take 75 mg by mouth. Prn      . Diphenhydramine-PE-APAP (BENADRYL ALLERGY/SINUS HEADACH) 12.5-5-325 MG TABS Take 0.5 tablets by mouth as needed.       Marland Kitchen estradiol (ESTRACE) 0.1 MG/GM vaginal cream Place 1 g vaginally as needed. Use once a week      . gabapentin (NEURONTIN) 100 MG tablet Take 300 mg by mouth 3 (three) times daily.        . Guaifenesin 1200 MG TB12 Take by mouth as needed.       Marland Kitchen L-Theanine 100 MG CAPS Take 2 tablets by mouth 3 (three) times daily.        Marland Kitchen levothyroxine (SYNTHROID, LEVOTHROID) 50 MCG tablet Take  75 mcg by mouth daily.       Marland Kitchen liothyronine (CYTOMEL) 5 MCG tablet Take 5 mcg by mouth daily.        Marland Kitchen loratadine (CLARITIN) 10 MG tablet Take 10 mg by mouth daily.      Marland Kitchen losartan (COZAAR) 50 MG tablet Take 1 tablet (50 mg total) by mouth daily.  30 tablet  11  . MAGNESIUM GLYCINATE PLUS PO Take 1 capsule by mouth daily.       . metFORMIN (GLUCOPHAGE) 500 MG tablet Take 500 mg by mouth 2 (two) times daily with a meal.       . metoprolol succinate (TOPROL-XL) 50 MG 24 hr tablet TAKE 1 TABLET BY MOUTH EVERY DAY  30 tablet  10  . OMEGA-3 1000 MG CAPS Take 1,000 mg by mouth daily.        Marland Kitchen omeprazole (PRILOSEC) 40 MG capsule Take 40 mg by mouth 2 (two) times daily.      . Thiamine HCl (VITAMIN B-1) 250 MG tablet Take 250 mg by mouth daily.        . vitamin B-12 (CYANOCOBALAMIN) 1000 MCG tablet Take 1,000 mcg by mouth daily.         No current facility-administered medications for this visit.    Past Medical  History  Diagnosis Date  . Cough   . Chronic interstitial cystitis   . Unspecified hypothyroidism   . Allergy, unspecified not elsewhere classified   . Paroxysmal ventricular tachycardia   . Type II or unspecified type diabetes mellitus without mention of complication, not stated as uncontrolled   . Unspecified asthma(493.90)   . Irritable bowel syndrome   . Esophageal reflux   . Obstructive sleep apnea (adult) (pediatric)   . Ventricular tachycardia   . Nonocclusive coronary atherosclerosis of native coronary artery   . Hypertension   . Hypertriglyceridemia   . H/O syncope 2007    No recurrence;Tilt table 09/23/05-negative    Past Surgical History  Procedure Laterality Date  . Cholecystectomy  07/2000  . Abdominal hysterectomy  11/1979    for endometriosis  . Esophagogastroduodenoscopy endoscopy  2010    Family History  Problem Relation Age of Onset  . Diabetes Mother   . Breast cancer Mother   . Arthritis Mother   . Diabetes Father   . Stomach cancer Father   . Heart disease Father   . Leukemia Brother     History   Social History  . Marital Status: Married    Spouse Name: N/A    Number of Children: N/A  . Years of Education: N/A   Occupational History  . Not on file.   Social History Main Topics  . Smoking status: Never Smoker   . Smokeless tobacco: Never Used  . Alcohol Use: No  . Drug Use: Not on file  . Sexual Activity: Not on file   Other Topics Concern  . Not on file   Social History Narrative  . No narrative on file    Review of systems: The patient specifically denies any chest pain at rest or with exertion, dyspnea at rest or with exertion, orthopnea, paroxysmal nocturnal dyspnea, syncope, palpitations, focal neurological deficits, intermittent claudication, lower extremity edema, unexplained weight gain, cough, hemoptysis or wheezing.  The patient also denies abdominal pain, nausea, vomiting, dysphagia, diarrhea, constipation, polyuria,  polydipsia, dysuria, hematuria, frequency, urgency, abnormal bleeding or bruising, fever, chills, unexpected weight changes, mood swings, change in skin or hair texture, change in voice quality,  auditory or visual problems, allergic reactions or rashes, new musculoskeletal complaints other than usual "aches and pains".   PHYSICAL EXAM BP 154/60  Pulse 76  Ht 5' 3.5" (1.613 m)  Wt 66.951 kg (147 lb 9.6 oz)  BMI 25.73 kg/m2 General: Alert, oriented x3, no distress  Head: no evidence of trauma, PERRL, EOMI, no exophtalmos or lid lag, no myxedema, no xanthelasma; normal ears, nose and oropharynx  Neck: normal jugular venous pulsations and no hepatojugular reflux; brisk carotid pulses without delay and no carotid bruits  Chest: clear to auscultation, no signs of consolidation by percussion or palpation, normal fremitus, symmetrical and full respiratory excursions  Cardiovascular: normal position and quality of the apical impulse, regular rhythm, normal first and second heart sounds, no murmurs, rubs or gallops  Abdomen: no tenderness or distention, no masses by palpation, no abnormal pulsatility or arterial bruits, normal bowel sounds, no hepatosplenomegaly  Extremities: no clubbing, cyanosis or edema; 2+ radial, ulnar and brachial pulses bilaterally; 2+ right femoral, posterior tibial and dorsalis pedis pulses; 2+ left femoral, posterior tibial and dorsalis pedis pulses; no subclavian or femoral bruits  Neurological: grossly nonfocal    EKG: Normal sinus rhythm, normal ECG   Lipid Panel     Component Value Date/Time   CHOL 188 07/26/2013 1429   TRIG 212.0* 07/26/2013 1429   HDL 52.90 07/26/2013 1429   CHOLHDL 4 07/26/2013 1429   VLDL 42.4* 07/26/2013 1429   LDLCALC 83 02/04/2013    BMET    Component Value Date/Time   NA 140 07/26/2013 1429   NA 141 02/04/2013   K 4.6 07/26/2013 1429   CL 106 07/26/2013 1429   CO2 28 07/26/2013 1429   GLUCOSE 95 07/26/2013 1429   BUN 15 07/26/2013 1429   BUN 17  02/04/2013   CREATININE 1.0 07/26/2013 1429   CREATININE 1.1 02/04/2013   CALCIUM 9.7 07/26/2013 1429     ASSESSMENT AND PLAN  While there is no evidence of acute illness, Mrs. Duellman has evidence of slight worsening of her coronary risk factors. This probably reflects less frequent and less intense physical exercise, as she has been involved with her mother's hospice care. I think for her physical and psychological well-being Mrs. Bissey needs to carve out some time several days a week for moderate physical activity. This will lead to weight loss, improved lipid profile and lower chance of progression to serious cardiovascular complications. It will be worthwhile to get another baseline functional status assessment with a simple treadmill stress test. It has been almost 3 years since her last stress test.  Patient Instructions  Your physician recommends that you schedule a follow-up appointment in: 1 year with Dr Timmie Foerster  Your physician has requested that you have an exercise tolerance test. For further information please visit [email or web address]. Please also follow instruction sheet, as given.            Orders Placed This Encounter  Procedures  . EKG 12-Lead  . Exercise tolerance test   Meds ordered this encounter  Medications  . estradiol (ESTRACE) 0.1 MG/GM vaginal cream    Sig: Place 1 g vaginally as needed. Use once a week    Sheretta Grumbine  Sanda Klein, MD, [facility] [phone] office [phone] pager

## 2013-08-15 ENCOUNTER — Encounter: Payer: Self-pay | Admitting: Family Medicine

## 2013-08-15 ENCOUNTER — Ambulatory Visit (INDEPENDENT_AMBULATORY_CARE_PROVIDER_SITE_OTHER): Payer: Medicare Other | Admitting: Family Medicine

## 2013-08-15 VITALS — BP 120/76 | HR 80 | Temp 97.5°F | Ht 62.5 in | Wt 148.2 lb

## 2013-08-15 DIAGNOSIS — D509 Iron deficiency anemia, unspecified: Secondary | ICD-10-CM

## 2013-08-15 DIAGNOSIS — H9190 Unspecified hearing loss, unspecified ear: Secondary | ICD-10-CM

## 2013-08-15 DIAGNOSIS — E7800 Pure hypercholesterolemia, unspecified: Secondary | ICD-10-CM

## 2013-08-15 DIAGNOSIS — E039 Hypothyroidism, unspecified: Secondary | ICD-10-CM

## 2013-08-15 DIAGNOSIS — Z0000 Encounter for general adult medical examination without abnormal findings: Secondary | ICD-10-CM

## 2013-08-15 DIAGNOSIS — E119 Type 2 diabetes mellitus without complications: Secondary | ICD-10-CM

## 2013-08-15 LAB — HM DIABETES FOOT EXAM

## 2013-08-15 LAB — HM DIABETES EYE EXAM

## 2013-08-15 MED ORDER — ALPRAZOLAM 0.5 MG PO TABS: 0.5000 mg | ORAL_TABLET | ORAL | Status: DC | PRN

## 2013-08-15 MED ORDER — DICLOFENAC SODIUM 75 MG PO TBEC: 75.0000 mg | DELAYED_RELEASE_TABLET | Freq: Two times a day (BID) | ORAL | Status: DC

## 2013-08-15 NOTE — Addendum Note (Signed)
Addended by: Eliezer Lofts E on: 08/15/2013 05:35 PM   Modules accepted: Orders

## 2013-08-15 NOTE — Patient Instructions (Addendum)
Get back on track with healthy eating, exercise and weight loss. Stop crestor for 1 week.. Call if muscle pain resolved. We can always change to cheaper option like lovastatin if interested. Stop lab on way out for thyroid tests. Look into Tdap vaccine.

## 2013-08-15 NOTE — Progress Notes (Signed)
68 year old complicated pt here for annual exam.  I have personally reviewed the Medicare Annual Wellness questionnaire and have noted 1. The patient's medical and social history 2. Their use of alcohol, tobacco or illicit drugs 3. Their current medications and supplements 4. The patient's functional ability including ADL's, fall risks, home safety risks and hearing or visual             impairment. 5. Diet and physical activities 6. Evidence for depression or mood disorders The patients weight, height, BMI and visual acuity have been recorded in the chart I have made referrals, counseling and provided education to the patient based review of the above and I have provided the pt with a written personalized care plan for preventive services.  With history including the following:  She was very healthy until 2001... fatigue, SOB, palpitations, syncope, choking.  Saw Pulmonologist, Cardiologist.  Dx Asthma, GERD.. But meds did not help. (Singulair)  Stress test showed tachycardia... Started on toprol.  Saw Dr Duwayne Heck...dx then with sleep apnea.  Improved some.  Saw alternative med DR.: Dr. Deirdre Pippins. Dx with vit D and other vit deficiency... Felt much better.  Also referred to [facility] (Dr. Cephus Richer) where dx with larynx swelling (vocal cord dysfunction)....coughing proceeds attacks.  She is on gabapentin to prevents this... Can take more if having an episode.  Usually every few months. Unknown trigger.  Endoscopy in last few months was normal.   She has been having some low back pain., She had history of DDD, arthritis in back, chronic low back pain. Plans on seeing chiropractor. Not doing her usual PT  She continues to care for her mother who is hospice. She has retired now.  Hypertension: Dr. Soyla Murphy has changed BP med increased cozaar to 50 mg daily for better BP control.  BP Readings from Last 3 Encounters:  08/15/13 120/76  08/01/13 154/60  02/08/13 118/78  Using medication  without problems or lightheadedness: none  Chest pain with exertion:none  Edema:none  Short of breath:none  Average home BPs:not checking lately Other issues:  Has upcoming treadmill test to eval fatigue.  Followed by Cardiologist Dr. Loletha Grayer  Elevated Cholesterol:  LDL at goal <130 on crestor but higher than 6 months ago, trig elevated  she wants to cahnge to generic med to make it cheaper. Lab Results  Component Value Date   CHOL 188 07/26/2013   HDL 52.90 07/26/2013   LDLCALC 83 02/04/2013   LDLDIRECT 105.6 07/26/2013   TRIG 212.0* 07/26/2013   CHOLHDL 4 07/26/2013   Using medications without problems:None  Muscle aches: Yes Diet compliance: moderate due to diet issues  Exercise: None currently.. Trying to find time. Other complaints:   Diabetes: A1C at 6.3 on glucophage  Using medications without difficulties:  Hypoglycemic episodes:?  Hyperglycemic episodes:?  Feet problems:sees podiatrist  Blood Sugars averaging:not checking  eye exam within last year: yes   Hypothyroid: TSH to low.  Review of Systems  Constitutional: Negative for fever and fatigue.  HENT: Negative for ear pain.  Eyes: Negative for pain.  Respiratory: Negative for chest tightness and shortness of breath.  Cardiovascular: Negative for chest pain, palpitations and leg swelling.  Gastrointestinal: Negative for abdominal pain.  Genitourinary: Negative for dysuria.  Objective:   Physical Exam  Constitutional: Vital signs are normal. She appears well-developed and well-nourished. She is cooperative. Non-toxic appearance. She does not appear ill. No distress.  HENT:  Head: Normocephalic.  Right Ear: Hearing, tympanic membrane, external ear and ear  canal normal.  Left Ear: Hearing, tympanic membrane, external ear and ear canal normal.  Nose: Nose normal.  Eyes: Conjunctivae normal, EOM and lids are normal. Pupils are equal, round, and reactive to light. No foreign bodies found.  Neck: Trachea normal and normal range of  motion. Neck supple. Carotid bruit is not present. No mass and no thyromegaly present.  Cardiovascular: Normal rate, regular rhythm, S1 normal, S2 normal, normal heart sounds and intact distal pulses. Exam reveals no gallop.  No murmur heard.  Pulmonary/Chest: Effort normal and breath sounds normal. No respiratory distress. She has no wheezes. She has no rhonchi. She has no rales.  Abdominal: Soft. Normal appearance and bowel sounds are normal. She exhibits no distension, no fluid wave, no abdominal bruit and no mass. There is no hepatosplenomegaly. There is no tenderness. There is no rebound, no guarding and no CVA tenderness. No hernia.  Genitourinary: No breast swelling, tenderness, discharge or bleeding. Pelvic exam was performed with patient supine.  Vag exam not indicated.  Lymphadenopathy:  She has no cervical adenopathy.  She has no axillary adenopathy.  Neurological: She is alert. She has normal strength. No cranial nerve deficit or sensory deficit.  Skin: Skin is warm, dry and intact. No rash noted.  Psychiatric: Her speech is normal and behavior is normal. Judgment normal. Her mood appears not anxious. Cognition and memory are normal. She does not exhibit a depressed mood.  Diabetic foot exam:  Normal inspection  No skin breakdown  No calluses  Normal DP pulses  Normal sensation to light touch and monofilament  Nails normal  Assessment & Plan:   The patient's preventative maintenance and recommended screening tests for an annual wellness exam were reviewed in full today.  Brought up to date unless services declined.  Counselled on the importance of diet, exercise, and its role in overall health and mortality.  The patient's FH and SH was reviewed, including their home life, tobacco status, and drug and alcohol status.   Colon:  03/04/2013, repeat in 10 years. DEXA: Normal 02/2011, can repeat in 5 years.  Mammogram last 10/2012 PAP not indicated, DVE not indicated total  hysterectomy, no vaginal discharge Vaccine: ? TDap, PNA, flu, shingles uptodate

## 2013-08-15 NOTE — Assessment & Plan Note (Signed)
Well controlled. Continue current medication.  

## 2013-08-15 NOTE — Assessment & Plan Note (Signed)
Now persistently low TSH. Eval with free t3 and t4. May need to decrease thyroid medication.

## 2013-08-15 NOTE — Assessment & Plan Note (Signed)
Consider referral to ENT or audiology visit. Likely this is cause of tinnitus as well.

## 2013-08-15 NOTE — Assessment & Plan Note (Signed)
Hold to see if  Myalgia is caused by this.  Can always try lovastatin  To be a cheaper option.  Work on taking fish oil regularly. Encouraged exercise, weight loss, healthy eating habits.

## 2013-08-15 NOTE — Progress Notes (Signed)
Pre-visit discussion using our clinic review tool. No additional management support is needed unless otherwise documented below in the visit note.  

## 2013-08-16 LAB — T3, FREE: T3 FREE: 2.6 pg/mL (ref 2.3–4.2)

## 2013-08-16 LAB — T4, FREE: Free T4: 0.78 ng/dL (ref 0.60–1.60)

## 2013-08-20 ENCOUNTER — Encounter (HOSPITAL_COMMUNITY): Payer: Medicare Other

## 2013-08-26 ENCOUNTER — Other Ambulatory Visit: Payer: Self-pay | Admitting: Family Medicine

## 2013-08-27 ENCOUNTER — Encounter (HOSPITAL_COMMUNITY): Payer: Medicare Other

## 2013-09-16 ENCOUNTER — Telehealth: Payer: Self-pay | Admitting: Family Medicine

## 2013-09-16 NOTE — Telephone Encounter (Signed)
Pt realized she has already seen a ENT Dr. Over at [facility] so she cancelled the appt at [facility] with Dr. Redmond Baseman. She is seeing Dr. Carol Ada at [facility]. They are needing an order sent over. Please fax to [phone]. Her appt is March 10th.

## 2013-09-19 ENCOUNTER — Other Ambulatory Visit: Payer: Self-pay | Admitting: Family Medicine

## 2013-09-25 NOTE — Telephone Encounter (Signed)
Faxed last OV

## 2013-10-07 ENCOUNTER — Encounter: Payer: Self-pay | Admitting: Family Medicine

## 2013-10-24 ENCOUNTER — Encounter: Payer: Self-pay | Admitting: Family Medicine

## 2013-10-24 ENCOUNTER — Other Ambulatory Visit: Payer: Self-pay | Admitting: Family Medicine

## 2013-10-25 NOTE — Telephone Encounter (Signed)
When we receive this I will sign. Please then mail to address listed.

## 2013-10-25 NOTE — Telephone Encounter (Signed)
Noted  

## 2013-10-30 ENCOUNTER — Other Ambulatory Visit: Payer: Self-pay | Admitting: *Deleted

## 2013-10-30 MED ORDER — ONETOUCH DELICA LANCETS 33G MISC: Status: DC

## 2014-01-14 ENCOUNTER — Other Ambulatory Visit: Payer: Self-pay

## 2014-01-14 DIAGNOSIS — Z1231 Encounter for screening mammogram for malignant neoplasm of breast: Secondary | ICD-10-CM

## 2014-01-22 ENCOUNTER — Other Ambulatory Visit: Payer: Self-pay | Admitting: Family Medicine

## 2014-01-27 ENCOUNTER — Telehealth: Payer: Self-pay | Admitting: *Deleted

## 2014-01-27 NOTE — Telephone Encounter (Signed)
Lm on pts vm requesting a call back to schedule DIABETIC BUNDLE LAB-needs LDL

## 2014-02-06 ENCOUNTER — Ambulatory Visit: Payer: Medicare Other

## 2014-02-06 ENCOUNTER — Ambulatory Visit
Admission: RE | Admit: 2014-02-06 | Discharge: 2014-02-06 | Disposition: A | Discharge status: Home / Self Care | Payer: Medicare Other | Source: Ambulatory Visit

## 2014-02-06 DIAGNOSIS — Z1231 Encounter for screening mammogram for malignant neoplasm of breast: Secondary | ICD-10-CM

## 2014-03-13 ENCOUNTER — Ambulatory Visit (INDEPENDENT_AMBULATORY_CARE_PROVIDER_SITE_OTHER): Payer: Medicare Other | Admitting: Family Medicine

## 2014-03-13 ENCOUNTER — Encounter: Payer: Self-pay | Admitting: Family Medicine

## 2014-03-13 VITALS — BP 120/64 | HR 70 | Temp 97.6°F | Ht 62.5 in | Wt 144.5 lb

## 2014-03-13 DIAGNOSIS — M654 Radial styloid tenosynovitis [de Quervain]: Secondary | ICD-10-CM | POA: Insufficient documentation

## 2014-03-13 DIAGNOSIS — R5383 Other fatigue: Secondary | ICD-10-CM

## 2014-03-13 DIAGNOSIS — E039 Hypothyroidism, unspecified: Secondary | ICD-10-CM

## 2014-03-13 DIAGNOSIS — R5381 Other malaise: Secondary | ICD-10-CM

## 2014-03-13 DIAGNOSIS — E119 Type 2 diabetes mellitus without complications: Secondary | ICD-10-CM

## 2014-03-13 DIAGNOSIS — E7800 Pure hypercholesterolemia, unspecified: Secondary | ICD-10-CM

## 2014-03-13 DIAGNOSIS — I10 Essential (primary) hypertension: Secondary | ICD-10-CM

## 2014-03-13 LAB — COMPREHENSIVE METABOLIC PANEL
ALBUMIN: 4.5 g/dL (ref 3.5–5.2)
ALT: 22 U/L (ref 0–35)
AST: 28 U/L (ref 0–37)
Alkaline Phosphatase: 53 U/L (ref 39–117)
BUN: 17 mg/dL (ref 6–23)
CO2: 28 meq/L (ref 19–32)
Calcium: 9.7 mg/dL (ref 8.4–10.5)
Chloride: 100 mEq/L (ref 96–112)
Creatinine, Ser: 0.9 mg/dL (ref 0.4–1.2)
GFR: 62.62 mL/min (ref >60.00)
GLUCOSE: 93 mg/dL (ref 70–99)
POTASSIUM: 4.4 meq/L (ref 3.5–5.1)
SODIUM: 136 meq/L (ref 135–145)
TOTAL PROTEIN: 7.9 g/dL (ref 6.0–8.3)
Total Bilirubin: 0.7 mg/dL (ref 0.2–1.2)

## 2014-03-13 LAB — CBC WITH DIFFERENTIAL/PLATELET
BASOS ABS: 0 10*3/uL (ref 0.0–0.1)
Basophils Relative: 0.5 % (ref 0.0–3.0)
Eosinophils Absolute: 0.1 10*3/uL (ref 0.0–0.7)
Eosinophils Relative: 0.8 % (ref 0.0–5.0)
HCT: 37.7 % (ref 36.0–46.0)
HEMOGLOBIN: 12.4 g/dL (ref 12.0–15.0)
LYMPHS PCT: 28.8 % (ref 12.0–46.0)
Lymphs Abs: 2.4 10*3/uL (ref 0.7–4.0)
MCHC: 32.9 g/dL (ref 30.0–36.0)
MCV: 87.6 fl (ref 78.0–100.0)
Monocytes Absolute: 0.5 10*3/uL (ref 0.1–1.0)
Monocytes Relative: 5.6 % (ref 3.0–12.0)
NEUTROS PCT: 64.3 % (ref 43.0–77.0)
Neutro Abs: 5.4 10*3/uL (ref 1.4–7.7)
Platelets: 247 10*3/uL (ref 150.0–400.0)
RBC: 4.3 Mil/uL (ref 3.87–5.11)
RDW: 13.6 % (ref 11.5–15.5)
WBC: 8.3 10*3/uL (ref 4.0–10.5)

## 2014-03-13 LAB — T4, FREE: Free T4: 0.84 ng/dL (ref 0.60–1.60)

## 2014-03-13 LAB — T3, FREE: T3, Free: 2.5 pg/mL (ref 2.3–4.2)

## 2014-03-13 LAB — LIPID PANEL
CHOLESTEROL: 157 mg/dL (ref 0–200)
HDL: 53.7 mg/dL (ref >39.00)
LDL Cholesterol: 65 mg/dL (ref 0–99)
NONHDL: 103.3
Total CHOL/HDL Ratio: 3
Triglycerides: 194 mg/dL — ABNORMAL HIGH (ref 0.0–149.0)
VLDL: 38.8 mg/dL (ref 0.0–40.0)

## 2014-03-13 LAB — TSH: TSH: 1.23 u[IU]/mL (ref 0.35–4.50)

## 2014-03-13 LAB — HEMOGLOBIN A1C: HEMOGLOBIN A1C: 6.4 % (ref 4.6–6.5)

## 2014-03-13 LAB — HM DIABETES FOOT EXAM

## 2014-03-13 NOTE — Assessment & Plan Note (Signed)
Due for re-eval. 

## 2014-03-13 NOTE — Assessment & Plan Note (Signed)
Needs re-eval off cytomel

## 2014-03-13 NOTE — Progress Notes (Signed)
Pre visit review using our clinic review tool, if applicable. No additional management support is needed unless otherwise documented below in the visit note. 

## 2014-03-13 NOTE — Patient Instructions (Addendum)
Stop at lab on way out.  Start wearing brace on left wrist during the day   Start home PT.  Use ibuprofen 800 mg twice daily for a few days then as needed.  Call if not improving as expected.  De Quervain's Disease Harriet Pho disease is a condition often seen in racquet sports where there is a soreness (inflammation) in the cord like structures (tendons) which attach muscle to bone on the thumb side of the wrist. There may be a tightening of the tissuesaround the tendons. This condition is often helped by giving up or modifying the activity which caused it. When conservative treatment does not help, surgery may be required. Conservative treatment could include changes in the activity which brought about the problem or made it worse. Anti-inflammatory medications and injections may be used to help decrease the inflammation and help with pain control. Your caregiver will help you determine which is best for you. DIAGNOSIS  Often the diagnosis (learning what is wrong) can be made by examination. Sometimes x-rays are required. HOME CARE INSTRUCTIONS   Apply ice to the sore area for 15-20 minutes, 03-04 times per day while awake. Put the ice in a plastic bag and place a towel between the bag of ice and your skin. This is especially helpful if it can be done after all activities involving the sore wrist.  Temporary splinting may help.  Only take over-the-counter or prescription medicines for pain, discomfort or fever as directed by your caregiver. SEEK MEDICAL CARE IF:   Pain relief is not obtained with medications, or if you have increasing pain and seem to be getting worse rather than better. MAKE SURE YOU:   Understand these instructions.  Will watch your condition.  Will get help right away if you are not doing well or get worse. Document Released: 03/29/2001 Document Revised: 09/26/2011 Document Reviewed: 11/06/2013 [facility] Patient Information 2015 [place], [place]. This information is  not intended to replace advice given to you by your health care provider. Make sure you discuss any questions you have with your health care provider.

## 2014-03-13 NOTE — Progress Notes (Signed)
Subjective:    Patient ID: Valerie Vargas, female    DOB: 21-Jan-1946, 68 y.o.   MRN: [id number]  Wrist Pain  Pertinent negatives include no fever.  Diabetes Associated symptoms include fatigue. Pertinent negatives for diabetes include no chest pain.    68 year old female presents for follow up and new wrist pain.  Diabetes: Well controlled on metformin. at last check. Due for re-eval. Lab Results  Component Value Date   HGBA1C 6.2 07/26/2013  Using medications without difficulties:on none Hypoglycemic episodes:None Hyperglycemic episodes:None Feet problems:None Blood Sugars averaging: FBS none, postprandial 118 eye exam within last year: 07/2013  Hypertension:  Well controlled  BP Readings from Last 3 Encounters:  03/13/14 120/64  08/15/13 120/76  08/01/13 154/60  Using medication without problems or lightheadedness: None Chest pain with exertion:None Edema:None Short of breath:None Average home BPs:not checking Other issues:  Elevated Cholesterol:LDL almost at goal < 100 at last check , due for re-eval on crestor Lab Results  Component Value Date   CHOL 188 07/26/2013   HDL 52.90 07/26/2013   LDLCALC 83 02/04/2013   LDLDIRECT 105.6 07/26/2013   TRIG 212.0* 07/26/2013   CHOLHDL 4 07/26/2013  Using medications without problems: none Muscle aches: none Diet compliance: moderate Exercise:occ Other complaints:  She has stopped cytomel as she thought it could be causing body pain/joint pain. Needs thyroid recheck.  New left wrist pain ongoing x several months.Pain initially with every movement.  Now pain improved but only with lifting up an item. Pain on thumb side of wrists, pain with stretching wrist.  Started after moving mother few months ago. No numbness, no weakness. Tylenol helps a little with pain.   She has noted some fatigue and shortness of breath with exercise... has stress test with cardiology upcoming.  Review of Systems  Constitutional: Positive for  fatigue. Negative for fever.  HENT: Negative for ear pain.   Eyes: Negative for pain.  Respiratory: Positive for shortness of breath. Negative for chest tightness.   Cardiovascular: Negative for chest pain, palpitations and leg swelling.  Gastrointestinal: Negative for abdominal pain.  Genitourinary: Negative for dysuria.       Objective:   Physical Exam  Constitutional: Vital signs are normal. She appears well-developed and well-nourished. She is cooperative.  Non-toxic appearance. She does not appear ill. No distress.  HENT:  Head: Normocephalic.  Right Ear: Hearing, tympanic membrane, external ear and ear canal normal. Tympanic membrane is not erythematous, not retracted and not bulging.  Left Ear: Hearing, tympanic membrane, external ear and ear canal normal. Tympanic membrane is not erythematous, not retracted and not bulging.  Nose: No mucosal edema or rhinorrhea. Right sinus exhibits no maxillary sinus tenderness and no frontal sinus tenderness. Left sinus exhibits no maxillary sinus tenderness and no frontal sinus tenderness.  Mouth/Throat: Uvula is midline, oropharynx is clear and moist and mucous membranes are normal.  Eyes: Conjunctivae, EOM and lids are normal. Pupils are equal, round, and reactive to light. Lids are everted and swept, no foreign bodies found.  Neck: Trachea normal and normal range of motion. Neck supple. Carotid bruit is not present. No mass and no thyromegaly present.  Cardiovascular: Normal rate, regular rhythm, S1 normal, S2 normal, normal heart sounds, intact distal pulses and normal pulses.  Exam reveals no gallop and no friction rub.   No murmur heard. Pulmonary/Chest: Effort normal and breath sounds normal. Not tachypneic. No respiratory distress. She has no decreased breath sounds. She has no wheezes. She has  no rhonchi. She has no rales.  Abdominal: Soft. Normal appearance and bowel sounds are normal. There is no tenderness.  Neurological: She is alert.   Skin: Skin is warm, dry and intact. No rash noted.  Psychiatric: Her speech is normal and behavior is normal. Judgment and thought content normal. Her mood appears not anxious. Cognition and memory are normal. She does not exhibit a depressed mood.      Diabetic foot exam: Normal inspection No skin breakdown No calluses  Normal DP pulses Normal sensation to light touch and monofilament Nails normal     Assessment & Plan:

## 2014-03-13 NOTE — Assessment & Plan Note (Signed)
Treat with NSAIDs, wear thumb spica splint. Follow up if not improving.

## 2014-03-13 NOTE — Assessment & Plan Note (Signed)
Well controlled. Continue current medication.  

## 2014-03-20 ENCOUNTER — Telehealth: Payer: Self-pay | Admitting: *Deleted

## 2014-03-20 NOTE — Telephone Encounter (Signed)
Signed Licensed conveyancer faxed.

## 2014-03-27 ENCOUNTER — Other Ambulatory Visit: Payer: Self-pay | Admitting: Family Medicine

## 2014-04-14 ENCOUNTER — Other Ambulatory Visit: Payer: Self-pay | Admitting: Cardiovascular Disease

## 2014-04-14 ENCOUNTER — Other Ambulatory Visit: Payer: Self-pay | Admitting: Family Medicine

## 2014-04-14 NOTE — Telephone Encounter (Signed)
Last office visit 03/13/2014.  Ok to refill?

## 2014-04-14 NOTE — Telephone Encounter (Signed)
Rx was sent to pharmacy electronically. 

## 2014-06-11 ENCOUNTER — Telehealth (HOSPITAL_COMMUNITY): Payer: Self-pay | Admitting: *Deleted

## 2014-06-25 ENCOUNTER — Ambulatory Visit (HOSPITAL_COMMUNITY)
Admission: RE | Admit: 2014-06-25 | Discharge: 2014-06-25 | Disposition: A | Discharge status: Home / Self Care | Payer: Medicare Other | Source: Ambulatory Visit | Attending: Cardiovascular Disease | Admitting: Cardiovascular Disease

## 2014-06-25 DIAGNOSIS — R0602 Shortness of breath: Secondary | ICD-10-CM | POA: Insufficient documentation

## 2014-06-25 NOTE — Procedures (Signed)
Exercise Treadmill Test  Pre-Exercise Testing Evaluation NSR, normal tracing  Test  Exercise Tolerance Test Ordering MD: M. Gina Costilla  Interpreting MD:   Unique Test No: 1  Treadmill:  1  Indication for ETT: shortness of breath, fatigue  Contraindication to ETT: No   Stress Modality: exercise - treadmill  Cardiac Imaging Performed: non   Protocol: standard Bruce - maximal  Max BP:  186/79  Max MPHR (bpm):  150 85% MPR (bpm):  127  MPHR obtained (bpm):  155 % MPHR obtained: 103  Reached 85% MPHR (min:sec):  1:55 Total Exercise Time (min-sec): 7:08  Workload in METS: 8.70 Borg Scale:   Reason ETT Terminated:  patient's desire to stop    ST Segment Analysis At Rest: normal ST segments - no evidence of significant ST depression With Exercise: no evidence of significant ST depression  Other Information Arrhythmia:  No Angina during ETT:  absent (0) Quality of ETT:  diagnostic  ETT Interpretation:  normal - no evidence of ischemia by ST analysis  Comments: Good exercise tolerance. Normal BP response to exercise.  Sanda Klein, MD, [facility] [phone] office [phone] pager

## 2014-07-15 ENCOUNTER — Ambulatory Visit (INDEPENDENT_AMBULATORY_CARE_PROVIDER_SITE_OTHER): Payer: Medicare Other | Admitting: Family Medicine

## 2014-07-15 ENCOUNTER — Encounter: Payer: Self-pay | Admitting: Family Medicine

## 2014-07-15 VITALS — BP 124/60 | HR 73 | Temp 97.6°F | Wt 146.5 lb

## 2014-07-15 DIAGNOSIS — J189 Pneumonia, unspecified organism: Secondary | ICD-10-CM

## 2014-07-15 MED ORDER — AZITHROMYCIN 250 MG PO TABS: ORAL_TABLET | ORAL | Status: DC

## 2014-07-15 MED ORDER — PROMETHAZINE-DM 6.25-15 MG/5ML PO SYRP: 5.0000 mL | ORAL_SOLUTION | Freq: Four times a day (QID) | ORAL | Status: DC | PRN

## 2014-07-15 NOTE — Progress Notes (Signed)
Pre visit review using our clinic review tool, if applicable. No additional management support is needed unless otherwise documented below in the visit note. 

## 2014-07-15 NOTE — Progress Notes (Signed)
Dr. Frederico Hamman T. Crawford Tamura, MD, [facility] [place], [address] Phone: [phone] Fax: [phone]  07/15/2014  Patient: Valerie Vargas, MRN: [id number], DOB: 27-Dec-1945, 68 y.o.  Primary Physician:  Eliezer Lofts, MD  Chief Complaint: Cough  Subjective:   Valerie Vargas is a 68 y.o. very pleasant female patient who presents with the following:  Earlier had a sinus infection and cannot get rid of the cough. Started coughing all the time and worse when she is active. Taking some tessalon perles.   Breathing problems before, previously dx with asthma. Has been coughing 3-4 weeks. She did spend having some difficulty with walking and feeling somewhat short of breath with activity recently. Her cough is productive of sputum.  Past Medical History, Surgical History, Social History, Family History, Problem List, Medications, and Allergies have been reviewed and updated if relevant.  ROS: GEN: Acute illness details above GI: Tolerating PO intake GU: maintaining adequate hydration and urination Pulm: No SOB Interactive and getting along well at home.  Otherwise, ROS is as per the HPI.   Objective:   BP 124/60 mmHg  Pulse 73  Temp(Src) 97.6 F (36.4 C) (Oral)  Wt 146 lb 8 oz (66.452 kg)  SpO2 97%   GEN: A and O x 3. WDWN. NAD.    ENT: Nose clear, ext NML.  No LAD.  No JVD.  TM's clear. Oropharynx clear.  PULM: Normal WOB, no distress. No crackles, wheezes, rhonchi. CV: RRR, no M/G/R, No rubs, No JVD.   EXT: warm and well-perfused, No c/c/e. PSYCH: Pleasant and conversant.    Laboratory and Imaging Data:  Assessment and Plan:   Walking pneumonia  Given the timing with worsening over the last 3-4 weeks, worrisome for atypical pneumonia. We will treat this with Zithromax and give the patient some cough medication.  Follow-up: No Follow-up on file.  New Prescriptions   AZITHROMYCIN (ZITHROMAX) 250 MG TABLET     2 tabs po on day 1, then 1 tab po for 4 days   PROMETHAZINE-DEXTROMETHORPHAN (PROMETHAZINE-DM) 6.25-15 MG/5ML SYRUP    Take 5 mLs by mouth 4 (four) times daily as needed for cough.   No orders of the defined types were placed in this encounter.    Signed,  Maud Deed. Nivia Gervase, MD   Patient's Medications  New Prescriptions   AZITHROMYCIN (ZITHROMAX) 250 MG TABLET    2 tabs po on day 1, then 1 tab po for 4 days   PROMETHAZINE-DEXTROMETHORPHAN (PROMETHAZINE-DM) 6.25-15 MG/5ML SYRUP    Take 5 mLs by mouth 4 (four) times daily as needed for cough.  Previous Medications   ALBUTEROL (PROVENTIL,VENTOLIN) 90 MCG/ACT INHALER    Inhale 2 puffs into the lungs as needed.     ALPRAZOLAM (XANAX) 0.5 MG TABLET    Take 1 tablet (0.5 mg total) by mouth as needed.   ASCORBIC ACID (VITAMIN C) 1000 MG TABLET    Take 1,000 mg by mouth daily.     ASPIRIN 81 MG TABLET    Take 81 mg by mouth daily.     BENZONATATE (TESSALON) 100 MG CAPSULE    TAKE 1 CAPSULE BY MOUTH 2 TIMES DAILY ASNEEDED FOR COUGH   CHOLECALCIFEROL (VITAMIN D3) 1000 UNITS CAPS    Take 1 capsule by mouth daily.     COENZYME Q10 (CO Q 10) 100 MG CAPS    Take 400 mg by mouth daily.     CRESTOR 10 MG  TABLET    TAKE 1 TABLET BY MOUTH EVERY DAY FOR CHOLESTEROL   DICLOFENAC (VOLTAREN) 75 MG EC TABLET    Take 75 mg by mouth 2 (two) times daily as needed.   DIPHENHYDRAMINE-PE-APAP (BENADRYL ALLERGY/SINUS HEADACH) 12.5-5-325 MG TABS    Take 0.5 tablets by mouth as needed.    ESTRADIOL (ESTRACE) 0.1 MG/GM VAGINAL CREAM    Place 1 g vaginally as needed. Use once a week   GABAPENTIN (NEURONTIN) 100 MG TABLET    Take 300 mg by mouth 3 (three) times daily.     GUAIFENESIN 1200 MG TB12    Take by mouth as needed.    L-THEANINE 100 MG CAPS    Take 2 tablets by mouth 3 (three) times daily.     LEVOTHYROXINE (SYNTHROID, LEVOTHROID) 50 MCG TABLET    Take 75 mcg by mouth daily.    LIOTHYRONINE (CYTOMEL) 5 MCG TABLET    Take 5 mcg by mouth daily.     LORATADINE  (CLARITIN) 10 MG TABLET    Take 10 mg by mouth daily.   LOSARTAN (COZAAR) 50 MG TABLET    TAKE 1 TABLET BY MOUTH EVERY DAY   MAGNESIUM GLYCINATE PLUS PO    Take 1 capsule by mouth daily.    METFORMIN (GLUCOPHAGE) 500 MG TABLET    Take 500 mg by mouth 2 (two) times daily with a meal.    METOPROLOL SUCCINATE (TOPROL-XL) 50 MG 24 HR TABLET    TAKE 1 TABLET BY MOUTH EVERY DAY   OMEGA-3 1000 MG CAPS    Take 1,000 mg by mouth daily.     OMEPRAZOLE (PRILOSEC) 40 MG CAPSULE    Take 40 mg by mouth 2 (two) times daily.   ONETOUCH DELICA LANCETS 89Q MISC    TEST TWICE DAILY  Dx: 250.00   THIAMINE HCL (VITAMIN B-1) 250 MG TABLET    Take 250 mg by mouth daily.     VITAMIN B-12 (CYANOCOBALAMIN) 1000 MCG TABLET    Take 1,000 mcg by mouth daily.    Modified Medications   No medications on file  Discontinued Medications   No medications on file

## 2014-07-22 ENCOUNTER — Encounter (HOSPITAL_COMMUNITY): Payer: Medicare Other

## 2014-08-07 LAB — HM DIABETES EYE EXAM

## 2014-08-20 ENCOUNTER — Ambulatory Visit (INDEPENDENT_AMBULATORY_CARE_PROVIDER_SITE_OTHER): Payer: Medicare HMO | Admitting: Cardiovascular Disease

## 2014-08-20 VITALS — BP 138/58 | HR 82 | Resp 16 | Ht 63.0 in | Wt 150.1 lb

## 2014-08-20 DIAGNOSIS — E785 Hyperlipidemia, unspecified: Secondary | ICD-10-CM

## 2014-08-20 DIAGNOSIS — I2510 Atherosclerotic heart disease of native coronary artery without angina pectoris: Secondary | ICD-10-CM

## 2014-08-20 DIAGNOSIS — I10 Essential (primary) hypertension: Secondary | ICD-10-CM

## 2014-08-20 NOTE — Patient Instructions (Signed)
Take Metoprolol in the evening instead of the morning.  Dr. Sallyanne Kuster recommends that you schedule a follow-up appointment in: One year.

## 2014-08-22 ENCOUNTER — Encounter: Payer: Self-pay | Admitting: Cardiovascular Disease

## 2014-08-22 DIAGNOSIS — E785 Hyperlipidemia, unspecified: Secondary | ICD-10-CM

## 2014-08-22 HISTORY — DX: Hyperlipidemia, unspecified: E78.5

## 2014-08-22 NOTE — Progress Notes (Signed)
Patient ID: Valerie Vargas, female   DOB: Nov 17, 1945, 69 y.o.   MRN: [id number]     Reason for office visit Nonobstructive coronary artery disease and multiple coronary risk factors, remote history of syncope  Valerie Vargas has not had any serious health issues since her appointment a year ago. Her mother,Edna Wall, passed away last Dec 31, 2022 while on home hospice.  Valerie Vargas, mainly feels very tired in the afternoon, even sleepy. Otherwise she has no cardiovascular complaints. Her recent hemoglobin A1c was ideal at around 6.1%. Her TSH was recently checked and found to be normal. Blood pressure control has been good. She denies any chest pain or dyspnea. She had a normal treadmill stress test in December 2015  She had a couple of syncopal events in 2007 that were not explained by her rhythm monitoring or tilt table testing. These have not recurred in 8 years. She had minor coronary artery disease by an angiogram performed in 2003. She has well-controlled hypertension, mixed hyperlipidemia and type 2 diabetes mellitus. Her blood pressure at home is consistently lower than when she comes to see [place] in the office. She states that a typical blood pressure reading will be in the 130s over 70s.  Allergies  Allergen Reactions  . Codeine   . Hctz [Hydrochlorothiazide]   . Penicillins Rash  . Sulfa Antibiotics Rash    Current Outpatient Prescriptions  Medication Sig Dispense Refill  . albuterol (PROVENTIL,VENTOLIN) 90 MCG/ACT inhaler Inhale 2 puffs into the lungs as needed.      . ALPRAZolam (XANAX) 0.5 MG tablet Take 1 tablet (0.5 mg total) by mouth as needed. 10 tablet 0  . Ascorbic Acid (VITAMIN C) 1000 MG tablet Take 1,000 mg by mouth daily.      Marland Kitchen aspirin 81 MG tablet Take 81 mg by mouth daily.      . benzonatate (TESSALON) 100 MG capsule TAKE 1 CAPSULE BY MOUTH 2 TIMES DAILY ASNEEDED FOR COUGH 60 capsule 0  . Cholecalciferol (VITAMIN D3) 1000 UNITS CAPS Take 1 capsule by mouth daily.      .  Coenzyme Q10 (CO Q 10) 100 MG CAPS Take 400 mg by mouth daily.      . CRESTOR 10 MG tablet TAKE 1 TABLET BY MOUTH EVERY DAY FOR CHOLESTEROL 30 tablet 11  . diclofenac (VOLTAREN) 75 MG EC tablet Take 75 mg by mouth 2 (two) times daily as needed.    . Diphenhydramine-PE-APAP (BENADRYL ALLERGY/SINUS HEADACH) 12.5-5-325 MG TABS Take 0.5 tablets by mouth as needed.     . gabapentin (NEURONTIN) 100 MG tablet Take 300 mg by mouth 3 (three) times daily.      . Guaifenesin 1200 MG TB12 Take by mouth as needed.     Marland Kitchen L-Theanine 100 MG CAPS Take 2 tablets by mouth as needed.     . loratadine (CLARITIN) 10 MG tablet Take 10 mg by mouth daily.    Marland Kitchen losartan (COZAAR) 50 MG tablet TAKE 1 TABLET BY MOUTH EVERY DAY 30 tablet 5  . MAGNESIUM GLYCINATE PLUS PO Take 1 capsule by mouth daily.     . metFORMIN (GLUCOPHAGE) 500 MG tablet Take 500 mg by mouth 2 (two) times daily with a meal.     . metoprolol succinate (TOPROL-XL) 50 MG 24 hr tablet TAKE 1 TABLET BY MOUTH EVERY DAY 30 tablet 4  . OMEGA-3 1000 MG CAPS Take 1,000 mg by mouth daily.      Marland Kitchen omeprazole (PRILOSEC) 40 MG capsule Take 40 mg  by mouth 2 (two) times daily.    Glory Rosebush DELICA LANCETS 15V MISC TEST TWICE DAILY  Dx: 250.00 100 each 6  . promethazine-dextromethorphan (PROMETHAZINE-DM) 6.25-15 MG/5ML syrup Take 5 mLs by mouth 4 (four) times daily as needed for cough. 180 mL 0  . SYNTHROID 75 MCG tablet Take 1 tablet by mouth. 6 days a week    . Thiamine HCl (VITAMIN B-1) 250 MG tablet Take 250 mg by mouth daily.      . vitamin B-12 (CYANOCOBALAMIN) 1000 MCG tablet Take 1,000 mcg by mouth daily.       No current facility-administered medications for this visit.    Past Medical History  Diagnosis Date  . Cough   . Chronic interstitial cystitis   . Unspecified hypothyroidism   . Allergy, unspecified not elsewhere classified   . Paroxysmal ventricular tachycardia   . Type II or unspecified type diabetes mellitus without mention of complication,  not stated as uncontrolled   . Unspecified asthma(493.90)   . Irritable bowel syndrome   . Esophageal reflux   . Obstructive sleep apnea (adult) (pediatric)   . Ventricular tachycardia   . Nonocclusive coronary atherosclerosis of native coronary artery   . Hypertension   . Hypertriglyceridemia   . H/O syncope 2007    No recurrence;Tilt table 09/23/05-negative  . Hyperlipidemia 08/22/2014    Past Surgical History  Procedure Laterality Date  . Cholecystectomy  07/2000  . Abdominal hysterectomy  11/1979    for endometriosis  . Esophagogastroduodenoscopy endoscopy  2010    Family History  Problem Relation Age of Onset  . Diabetes Mother   . Breast cancer Mother   . Arthritis Mother   . Diabetes Father   . Stomach cancer Father   . Heart disease Father   . Leukemia Brother     History   Social History  . Marital Status: Married    Spouse Name: N/A    Number of Children: N/A  . Years of Education: N/A   Occupational History  . Not on file.   Social History Main Topics  . Smoking status: Never Smoker   . Smokeless tobacco: Never Used  . Alcohol Use: No  . Drug Use: No  . Sexual Activity: Not on file   Other Topics Concern  . Not on file   Social History Narrative    Review of systems: The patient specifically denies any chest pain at rest or with exertion, dyspnea at rest or with exertion, orthopnea, paroxysmal nocturnal dyspnea, syncope, palpitations, focal neurological deficits, intermittent claudication, lower extremity edema, unexplained weight gain, cough, hemoptysis or wheezing.  The patient also denies abdominal pain, nausea, vomiting, dysphagia, diarrhea, constipation, polyuria, polydipsia, dysuria, hematuria, frequency, urgency, abnormal bleeding or bruising, fever, chills, unexpected weight changes, mood swings, change in skin or hair texture, change in voice quality, auditory or visual problems, allergic reactions or rashes, new musculoskeletal complaints  other than usual "aches and pains".   PHYSICAL EXAM BP 138/58 mmHg  Pulse 82  Ht 5\' 3"  (1.6 m)  Wt 68.085 kg (150 lb 1.6 oz)  BMI 26.60 kg/m2  General: Alert, oriented x3, no distress Head: no evidence of trauma, PERRL, EOMI, no exophtalmos or lid lag, no myxedema, no xanthelasma; normal ears, nose and oropharynx Neck: normal jugular venous pulsations and no hepatojugular reflux; brisk carotid pulses without delay and no carotid bruits Chest: clear to auscultation, no signs of consolidation by percussion or palpation, normal fremitus, symmetrical and full respiratory excursions Cardiovascular: normal  position and quality of the apical impulse, regular rhythm, normal first and second heart sounds, no murmurs, rubs or gallops Abdomen: no tenderness or distention, no masses by palpation, no abnormal pulsatility or arterial bruits, normal bowel sounds, no hepatosplenomegaly Extremities: no clubbing, cyanosis or edema; 2+ radial, ulnar and brachial pulses bilaterally; 2+ right femoral, posterior tibial and dorsalis pedis pulses; 2+ left femoral, posterior tibial and dorsalis pedis pulses; no subclavian or femoral bruits Neurological: grossly nonfocal   EKG: Normal sinus rhythm  Lipid Panel     Component Value Date/Time   CHOL 157 03/13/2014 1213   TRIG 194.0* 03/13/2014 1213   HDL 53.70 03/13/2014 1213   CHOLHDL 3 03/13/2014 1213   VLDL 38.8 03/13/2014 1213   LDLCALC 65 03/13/2014 1213   LDLDIRECT 105.6 07/26/2013 1429    BMET    Component Value Date/Time   NA 136 03/13/2014 1213   NA 141 02/04/2013   K 4.4 03/13/2014 1213   CL 100 03/13/2014 1213   CO2 28 03/13/2014 1213   GLUCOSE 93 03/13/2014 1213   BUN 17 03/13/2014 1213   BUN 17 02/04/2013   CREATININE 0.9 03/13/2014 1213   CREATININE 1.1 02/04/2013   CALCIUM 9.7 03/13/2014 1213     ASSESSMENT AND PLAN  I wonder if Valerie Vargas fatigue might be related to the peak effect of her sustained-release beta blocker in the  early afternoon. Recommended that she start taking her metoprolol in the evenings. Otherwise her coronary risk factors are all well addressed. She is only borderline overweight and has excellent glycemic control and cholesterol levels. Her triglycerides were slightly high and we discussed restriction of carbohydrates, especially sweets and starches with low glycemic index.  Meds ordered this encounter  Medications  . SYNTHROID 75 MCG tablet    Sig: Take 1 tablet by mouth. 6 days a week    Valerie Vargas  Sanda Klein, MD, [facility] [phone] office [phone] pager

## 2014-09-03 ENCOUNTER — Other Ambulatory Visit: Payer: Self-pay | Admitting: Family Medicine

## 2014-09-08 ENCOUNTER — Other Ambulatory Visit: Payer: Medicare Other

## 2014-09-15 ENCOUNTER — Encounter: Payer: Medicare Other | Admitting: Family Medicine

## 2014-10-07 ENCOUNTER — Other Ambulatory Visit: Payer: Self-pay | Admitting: Cardiovascular Disease

## 2014-10-07 NOTE — Telephone Encounter (Signed)
Rx(s) sent to pharmacy electronically.  

## 2014-10-30 ENCOUNTER — Telehealth: Payer: Self-pay | Admitting: Family Medicine

## 2014-10-30 ENCOUNTER — Other Ambulatory Visit (INDEPENDENT_AMBULATORY_CARE_PROVIDER_SITE_OTHER): Payer: Medicare HMO

## 2014-10-30 DIAGNOSIS — E7800 Pure hypercholesterolemia, unspecified: Secondary | ICD-10-CM

## 2014-10-30 DIAGNOSIS — E039 Hypothyroidism, unspecified: Secondary | ICD-10-CM | POA: Diagnosis not present

## 2014-10-30 DIAGNOSIS — E119 Type 2 diabetes mellitus without complications: Secondary | ICD-10-CM

## 2014-10-30 LAB — HEMOGLOBIN A1C: HEMOGLOBIN A1C: 6.5 % (ref 4.6–6.5)

## 2014-10-30 LAB — COMPREHENSIVE METABOLIC PANEL
ALBUMIN: 4.4 g/dL (ref 3.5–5.2)
ALT: 25 U/L (ref 0–35)
AST: 28 U/L (ref 0–37)
Alkaline Phosphatase: 51 U/L (ref 39–117)
BUN: 14 mg/dL (ref 6–23)
CALCIUM: 9.7 mg/dL (ref 8.4–10.5)
CO2: 28 meq/L (ref 19–32)
Chloride: 104 mEq/L (ref 96–112)
Creatinine, Ser: 0.97 mg/dL (ref 0.40–1.20)
GFR: 60.28 mL/min (ref >60.00)
Glucose, Bld: 100 mg/dL — ABNORMAL HIGH (ref 70–99)
POTASSIUM: 4.2 meq/L (ref 3.5–5.1)
SODIUM: 138 meq/L (ref 135–145)
Total Bilirubin: 0.5 mg/dL (ref 0.2–1.2)
Total Protein: 7.3 g/dL (ref 6.0–8.3)

## 2014-10-30 LAB — T4, FREE: Free T4: 0.8 ng/dL (ref 0.60–1.60)

## 2014-10-30 LAB — LIPID PANEL
CHOL/HDL RATIO: 3
Cholesterol: 162 mg/dL (ref 0–200)
HDL: 50.9 mg/dL (ref >39.00)
LDL Cholesterol: 71 mg/dL (ref 0–99)
NONHDL: 111.1
TRIGLYCERIDES: 200 mg/dL — AB (ref 0.0–149.0)
VLDL: 40 mg/dL (ref 0.0–40.0)

## 2014-10-30 LAB — TSH: TSH: 1.29 u[IU]/mL (ref 0.35–4.50)

## 2014-10-30 NOTE — Telephone Encounter (Signed)
-----   Message from Ellamae Sia sent at 10/24/2014 11:20 AM EDT ----- Regarding: Lab orders for Thursday, 4.14.16 Patient is scheduled for CPX labs, please order future labs, Thanks , Karna Christmas

## 2014-10-30 NOTE — Addendum Note (Signed)
Addended by: Ellamae Sia on: 10/30/2014 09:56 AM   Modules accepted: Orders

## 2014-11-06 ENCOUNTER — Ambulatory Visit (INDEPENDENT_AMBULATORY_CARE_PROVIDER_SITE_OTHER): Payer: Medicare HMO | Admitting: Family Medicine

## 2014-11-06 ENCOUNTER — Encounter: Payer: Self-pay | Admitting: Family Medicine

## 2014-11-06 VITALS — BP 120/72 | HR 67 | Temp 97.5°F | Ht 62.5 in | Wt 149.2 lb

## 2014-11-06 DIAGNOSIS — E7800 Pure hypercholesterolemia, unspecified: Secondary | ICD-10-CM

## 2014-11-06 DIAGNOSIS — E119 Type 2 diabetes mellitus without complications: Secondary | ICD-10-CM | POA: Diagnosis not present

## 2014-11-06 DIAGNOSIS — E038 Other specified hypothyroidism: Secondary | ICD-10-CM | POA: Diagnosis not present

## 2014-11-06 DIAGNOSIS — I10 Essential (primary) hypertension: Secondary | ICD-10-CM

## 2014-11-06 DIAGNOSIS — Z0000 Encounter for general adult medical examination without abnormal findings: Secondary | ICD-10-CM

## 2014-11-06 DIAGNOSIS — Z23 Encounter for immunization: Secondary | ICD-10-CM

## 2014-11-06 DIAGNOSIS — Z7189 Other specified counseling: Secondary | ICD-10-CM | POA: Insufficient documentation

## 2014-11-06 LAB — HM DIABETES FOOT EXAM

## 2014-11-06 MED ORDER — ALPRAZOLAM 0.5 MG PO TABS: 0.5000 mg | ORAL_TABLET | ORAL | Status: DC | PRN

## 2014-11-06 MED ORDER — BENZONATATE 100 MG PO CAPS: 200.0000 mg | ORAL_CAPSULE | Freq: Three times a day (TID) | ORAL | Status: DC | PRN

## 2014-11-06 MED ORDER — ONETOUCH DELICA LANCETS 33G MISC: Status: DC

## 2014-11-06 MED ORDER — ALBUTEROL SULFATE HFA 108 (90 BASE) MCG/ACT IN AERS: 2.0000 | INHALATION_SPRAY | Freq: Four times a day (QID) | RESPIRATORY_TRACT | Status: DC | PRN

## 2014-11-06 MED ORDER — GLUCOSE BLOOD VI STRP: ORAL_STRIP | Status: DC

## 2014-11-06 NOTE — Assessment & Plan Note (Signed)
LDL at goal ,trig remain high on crestor Encouraged exercise, weight loss, healthy eating habits.

## 2014-11-06 NOTE — Progress Notes (Signed)
Pre visit review using our clinic review tool, if applicable. No additional management support is needed unless otherwise documented below in the visit note. 

## 2014-11-06 NOTE — Progress Notes (Signed)
69 year old complicated pt here for annual exam.  With history including the following:  She was very healthy until 2001... fatigue, SOB, palpitations, syncope, choking.  Saw Pulmonologist, Cardiologist.  Dx Asthma, GERD.. But meds did not help. (Singulair)  Stress test showed tachycardia... Started on toprol.  Saw Dr Duwayne Heck...dx then with sleep apnea.  Improved some.  Saw alternative med DR.: Dr. Deirdre Pippins. Dx with vit D and other vit deficiency... Felt much better.  Also referred to [facility] (Dr. Cephus Richer) where dx with larynx swelling (vocal cord dysfunction)....coughing proceeds attacks.  She is on gabapentin to prevents this... Can take more if having an episode.  Usually every few months. Unknown trigger.  Endoscopy in last few months was normal.   Tessalon perles and gabapentin controlling cough from laryngeal swelling.  Now followed at [facility]  By Dr. Joya Gaskins.  Plans consult wiyth voice therapy.  Hypertension: well controlled on cozaar  BP Readings from Last 3 Encounters:  11/06/14 120/72  08/20/14 138/58  07/15/14 124/60  Using medication without problems or lightheadedness: none  Chest pain with exertion:none  Edema:none  Short of breath:none  Average home IWL:[id number]  Other issues:   Elevated Cholesterol: LDL at goal on crestor  Lab Results  Component Value Date   CHOL 162 10/30/2014   HDL 50.90 10/30/2014   LDLCALC 71 10/30/2014   LDLDIRECT 105.6 07/26/2013   TRIG 200.0* 10/30/2014   CHOLHDL 3 10/30/2014  Using medications without problems:None  Muscle aches: None  Diet compliance: moderate due to diet issues  Exercise: Occ.  Other complaints:   Diabetes: On glucophage  Lab Results  Component Value Date   HGBA1C 6.5 10/30/2014  Using medications without difficulties:  Hypoglycemic episodes:?  Hyperglycemic episodes:?  Feet problems:sees podiatrist  Blood Sugars averaging:occ checking fasting 125 eye exam within last  year: yes   Hypothyroid: followed by Dr. Soyla Murphy.    Review of Systems  Constitutional: Negative for fever and fatigue.  HENT: Negative for ear pain.  Eyes: Negative for pain.  Respiratory: Negative for chest tightness and shortness of breath.  Cardiovascular: Negative for chest pain, palpitations and leg swelling.  Gastrointestinal: Negative for abdominal pain.  Genitourinary: Negative for dysuria.       Objective:   Physical Exam  Constitutional: Vital signs are normal. She appears well-developed and well-nourished. She is cooperative. Non-toxic appearance. She does not appear ill. No distress.  HENT:  Head: Normocephalic.  Right Ear: Hearing, tympanic membrane, external ear and ear canal normal.  Left Ear: Hearing, tympanic membrane, external ear and ear canal normal.  Nose: Nose normal.  Eyes: Conjunctivae normal, EOM and lids are normal. Pupils are equal, round, and reactive to light. No foreign bodies found.  Neck: Trachea normal and normal range of motion. Neck supple. Carotid bruit is not present. No mass and no thyromegaly present.  Cardiovascular: Normal rate, regular rhythm, S1 normal, S2 normal, normal heart sounds and intact distal pulses. Exam reveals no gallop.  No murmur heard. Pulmonary/Chest: Effort normal and breath sounds normal. No respiratory distress. She has no wheezes. She has no rhonchi. She has no rales.  Abdominal: Soft. Normal appearance and bowel sounds are normal. She exhibits no distension, no fluid wave, no abdominal bruit and no mass. There is no hepatosplenomegaly. There is no tenderness. There is no rebound, no guarding and no CVA tenderness. No hernia.  Genitourinary: No breast swelling, tenderness, discharge or bleeding. Pelvic exam was performed with patient supine.   Vag exam not indicated.  Lymphadenopathy:   She has no cervical adenopathy.   She has no axillary adenopathy.  Neurological: She is alert. She has normal strength.  No cranial nerve deficit or sensory deficit.  Skin: Skin is warm, dry and intact. No rash noted.  Psychiatric: Her speech is normal and behavior is normal. Judgment normal. Her mood appears not anxious. Cognition and memory are normal. She does not exhibit a depressed mood.      Diabetic foot exam: Normal inspection No skin breakdown No calluses  Normal DP pulses Normal sensation to light touch and monofilament Nails normal     Assessment & Plan:  The patient's preventative maintenance and recommended screening tests for an annual wellness exam were reviewed in full today. Brought up to date unless services declined.  Counselled on the importance of diet, exercise, and its role in overall health and mortality. The patient's FH and SH was reviewed, including their home life, tobacco status, and drug and alcohol status.   Colon: 03/15/2013 nml.. Due for 10 year repeat. Nonsmoker DEXA: Normal 02/2011, can repeat in 5 years. Mammogram last 01/2014 PAP not indicated, DVE not indicated total hysterecotmy Vaccine: ? TDap ( given rx to take to pharm), Prevnar, ? Pneumovax ( given today)uptodate, shingles and , flu uptodate

## 2014-11-06 NOTE — Assessment & Plan Note (Signed)
Well controlled. Continue current medication.  

## 2014-11-06 NOTE — Addendum Note (Signed)
Addended by: Carter Kitten on: 11/06/2014 12:39 PM   Modules accepted: Orders

## 2015-03-17 ENCOUNTER — Other Ambulatory Visit: Payer: Self-pay

## 2015-03-17 DIAGNOSIS — Z1231 Encounter for screening mammogram for malignant neoplasm of breast: Secondary | ICD-10-CM

## 2015-03-30 ENCOUNTER — Other Ambulatory Visit: Payer: Self-pay | Admitting: Family Medicine

## 2015-04-24 ENCOUNTER — Ambulatory Visit
Admission: RE | Admit: 2015-04-24 | Discharge: 2015-04-24 | Disposition: A | Discharge status: Home / Self Care | Payer: Medicare HMO | Source: Ambulatory Visit

## 2015-04-24 DIAGNOSIS — Z1231 Encounter for screening mammogram for malignant neoplasm of breast: Secondary | ICD-10-CM

## 2015-05-04 LAB — HM DIABETES EYE EXAM

## 2015-05-28 ENCOUNTER — Telehealth: Payer: Self-pay | Admitting: *Deleted

## 2015-05-28 MED ORDER — LOSARTAN POTASSIUM 50 MG PO TABS: 50.0000 mg | ORAL_TABLET | Freq: Every day | ORAL | Status: DC

## 2015-05-28 NOTE — Telephone Encounter (Signed)
Please call and schedule follow up appointment for diabetes with fasting labs prior for Dr. Diona Browner.

## 2015-05-28 NOTE — Telephone Encounter (Signed)
Left message asking pt to call office  °

## 2015-06-01 NOTE — Telephone Encounter (Signed)
Patient goes to see Dr.Balan,Endocrinologist.  Patient had her labs done last week and has an appointment with Dr.Balan this week.  Patient said she'll ask for the lab and office note to be faxed to [facility].

## 2015-06-01 NOTE — Telephone Encounter (Signed)
Patient wants to know if she needs a follow up appointment.

## 2015-06-02 NOTE — Telephone Encounter (Signed)
Ms. Beitler notified as instructed by telephone.  She will ask Dr. Chalmers Cater to fax over labs and office note at her appointment with him tomorrow.

## 2015-06-02 NOTE — Telephone Encounter (Signed)
No appt needed, get labs and note from Dr. Chalmers Cater.

## 2015-06-23 ENCOUNTER — Other Ambulatory Visit: Payer: Self-pay | Admitting: Family Medicine

## 2015-06-23 NOTE — Telephone Encounter (Signed)
Last office visit 11/06/2014.  Last refilled 11/06/2014 for #10 with no refills.  Ok to refill?

## 2015-06-23 NOTE — Telephone Encounter (Signed)
Alprazolam called into [facility].

## 2015-08-21 ENCOUNTER — Encounter: Payer: Self-pay | Admitting: Cardiovascular Disease

## 2015-08-21 ENCOUNTER — Ambulatory Visit (INDEPENDENT_AMBULATORY_CARE_PROVIDER_SITE_OTHER): Payer: PPO | Admitting: Cardiovascular Disease

## 2015-08-21 VITALS — BP 114/70 | HR 82 | Ht 63.0 in | Wt 152.3 lb

## 2015-08-21 DIAGNOSIS — E119 Type 2 diabetes mellitus without complications: Secondary | ICD-10-CM

## 2015-08-21 DIAGNOSIS — E785 Hyperlipidemia, unspecified: Secondary | ICD-10-CM

## 2015-08-21 DIAGNOSIS — I10 Essential (primary) hypertension: Secondary | ICD-10-CM | POA: Diagnosis not present

## 2015-08-21 DIAGNOSIS — I2510 Atherosclerotic heart disease of native coronary artery without angina pectoris: Secondary | ICD-10-CM | POA: Diagnosis not present

## 2015-08-21 NOTE — Patient Instructions (Signed)
Dr. Croitoru recommends that you schedule a follow-up appointment in: ONE YEAR   

## 2015-08-21 NOTE — Progress Notes (Signed)
Patient ID: Valerie Vargas, female   DOB: 30-Sep-1945, 70 y.o.   MRN: [id number]    Cardiology Office Note    Date:  08/23/2015   ID:  Valerie Vargas, Valerie Vargas 08-19-1945, MRN [id number]  PCP:  Eliezer Lofts, MD  Cardiologist:   Sanda Klein, MD   Chief Complaint  Patient presents with  . Annual Exam     no chest pain, no shortness of breath, no edema, no pain or cramping in legs, occassional lightheadedness or dizziness    History of Present Illness:  Valerie Vargas is a 70 y.o. female with a remote history of syncope in 2007 and negative subsequent workup, minor coronary artery disease by angiography in 2003, hypertension, hyperlipidemia, type 2 diabetes mellitus .  She generally feels well without any cardiovascular complaints whatsoever and specifically denies chest pain, dyspnea, edema, claudication, focal neurological events, recurrent syncope. She has rare episodes of dizziness and lightheadedness with changes in position.  Recently her glycemic control has deteriorated but her hemoglobin A1c remains in usual target range at 6.6%. She has mild hypertriglyceridemia but her cholesterol is in target range on rosuvastatin therapy. She thinks the deterioration is due to the fact that she has been less physically active.   Past Medical History  Diagnosis Date  . Cough   . Chronic interstitial cystitis   . Unspecified hypothyroidism   . Allergy, unspecified not elsewhere classified   . Paroxysmal ventricular tachycardia ([place])   . Type II or unspecified type diabetes mellitus without mention of complication, not stated as uncontrolled   . Unspecified asthma(493.90)   . Irritable bowel syndrome   . Esophageal reflux   . Obstructive sleep apnea (adult) (pediatric)   . Ventricular tachycardia ([place])   . Nonocclusive coronary atherosclerosis of native coronary artery   . Hypertension   . Hypertriglyceridemia   . H/O syncope 2007    No recurrence;Tilt table 09/23/05-negative  .  Hyperlipidemia 08/22/2014    Past Surgical History  Procedure Laterality Date  . Cholecystectomy  07/2000  . Abdominal hysterectomy  11/1979    for endometriosis  . Esophagogastroduodenoscopy endoscopy  2010    Outpatient Prescriptions Prior to Visit  Medication Sig Dispense Refill  . albuterol (PROVENTIL HFA;VENTOLIN HFA) 108 (90 BASE) MCG/ACT inhaler Inhale 2 puffs into the lungs every 6 (six) hours as needed for wheezing or shortness of breath. 1 Inhaler 2  . albuterol (PROVENTIL,VENTOLIN) 90 MCG/ACT inhaler Inhale 2 puffs into the lungs as needed.      . ALPRAZolam (XANAX) 0.5 MG tablet TAKE ONE (1) TABLET EACH DAY AS NEEDED 10 tablet 0  . Ascorbic Acid (VITAMIN C) 1000 MG tablet Take 1,000 mg by mouth daily.      Marland Kitchen aspirin 81 MG tablet Take 81 mg by mouth daily.      . benzonatate (TESSALON) 100 MG capsule Take 2 capsules (200 mg total) by mouth 3 (three) times daily as needed for cough. 60 capsule 0  . Cholecalciferol (VITAMIN D3) 1000 UNITS CAPS Take 1 capsule by mouth daily.      . Coenzyme Q10 (CO Q 10) 100 MG CAPS Take 400 mg by mouth daily.      . Diphenhydramine-PE-APAP (BENADRYL ALLERGY/SINUS HEADACH) 12.5-5-325 MG TABS Take 0.5 tablets by mouth as needed.     . gabapentin (NEURONTIN) 100 MG tablet Take 300 mg by mouth 3 (three) times daily.      Marland Kitchen glucose blood (ONE TOUCH ULTRA TEST) test strip TEST TWICE DAILY  Dx:  E11.9 100 each 6  . Guaifenesin 1200 MG TB12 Take by mouth as needed.     Marland Kitchen L-Theanine 100 MG CAPS Take 2 tablets by mouth as needed.     . loratadine (CLARITIN) 10 MG tablet Take 10 mg by mouth daily.    Marland Kitchen losartan (COZAAR) 50 MG tablet Take 1 tablet (50 mg total) by mouth daily. 30 tablet 0  . MAGNESIUM GLYCINATE PLUS PO Take 1 capsule by mouth daily.     . metFORMIN (GLUCOPHAGE) 500 MG tablet Take 500 mg by mouth 2 (two) times daily with a meal.     . metoprolol succinate (TOPROL-XL) 50 MG 24 hr tablet Take 1 tablet (50 mg total) by mouth daily. 30 tablet 11  .  OMEGA-3 1000 MG CAPS Take 1,000 mg by mouth daily.      Marland Kitchen omeprazole (PRILOSEC) 40 MG capsule Take 40 mg by mouth 2 (two) times daily.    Glory Rosebush DELICA LANCETS 99991111 MISC TEST TWICE DAILY  Dx: E11.9 100 each 6  . promethazine-dextromethorphan (PROMETHAZINE-DM) 6.25-15 MG/5ML syrup Take 5 mLs by mouth 4 (four) times daily as needed for cough. 180 mL 0  . rosuvastatin (CRESTOR) 10 MG tablet TAKE 1 TABLET BY MOUTH EVERY DAY FOR CHOLESTEROL 30 tablet 5  . SYNTHROID 75 MCG tablet Take 1 tablet by mouth. 6 days a week    . Thiamine HCl (VITAMIN B-1) 250 MG tablet Take 250 mg by mouth daily.      . vitamin B-12 (CYANOCOBALAMIN) 1000 MCG tablet Take 1,000 mcg by mouth daily.      . diclofenac (VOLTAREN) 75 MG EC tablet Take 75 mg by mouth 2 (two) times daily as needed. Reported on 08/21/2015     No facility-administered medications prior to visit.     Allergies:   Codeine; Hctz; Penicillins; and Sulfa antibiotics   Social History   Social History  . Marital Status: Married    Spouse Name: N/A  . Number of Children: N/A  . Years of Education: N/A   Social History Main Topics  . Smoking status: Never Smoker   . Smokeless tobacco: Never Used  . Alcohol Use: No  . Drug Use: No  . Sexual Activity: Not Asked   Other Topics Concern  . None   Social History Narrative     Family History:  The patient's family history includes Arthritis in her mother; Breast cancer in her mother; Diabetes in her father and mother; Heart disease in her father; Leukemia in her brother; Stomach cancer in her father.   ROS:   Please see the history of present illness.    ROS All other systems reviewed and are negative.   PHYSICAL EXAM:   VS:  BP 114/70 mmHg  Pulse 82  Ht 5\' 3"  (1.6 m)  Wt 69.088 kg (152 lb 5 oz)  BMI 26.99 kg/m2   GEN: Well nourished, well developed, in no acute distress HEENT: normal Neck: no JVD, carotid bruits, or masses Cardiac: RRR; no murmurs, rubs, or gallops,no edema    Respiratory:  clear to auscultation bilaterally, normal work of breathing GI: soft, nontender, nondistended, + BS MS: no deformity or atrophy Skin: warm and dry, no rash Neuro:  Alert and Oriented x 3, Strength and sensation are intact Psych: euthymic mood, full affect  Wt Readings from Last 3 Encounters:  08/21/15 69.088 kg (152 lb 5 oz)  11/06/14 67.699 kg (149 lb 4 oz)  08/20/14 68.085 kg (150 lb 1.6 oz)  Studies/Labs Reviewed:   EKG:  EKG is ordered today.  The ekg ordered today demonstrates sinus rhythm, normal tracing with QTC 432 ms  Recent Labs: 10/30/2014: ALT 25; BUN 14; Creatinine, Ser 0.97; Potassium 4.2; Sodium 138; TSH 1.29   Lipid Panel    Component Value Date/Time   CHOL 162 10/30/2014 0925   TRIG 200.0* 10/30/2014 0925   HDL 50.90 10/30/2014 0925   CHOLHDL 3 10/30/2014 0925   VLDL 40.0 10/30/2014 0925   LDLCALC 71 10/30/2014 0925   LDLDIRECT 105.6 07/26/2013 1429     More recent labs from primary care physician show: Hemoglobin A1c 6.6%, creatinine 1.0, normal liver function tests Total cholesterol 200, Travis was 225, HDL 57, LDL 98    ASSESSMENT:    1. Coronary artery disease involving native coronary artery of native heart without angina pectoris   2. Essential hypertension   3. Hyperlipidemia   4. Diabetes mellitus with no complication (HCC)      PLAN:  In order of problems listed above:  1. No symptoms of angina pectoris. Encouraged her to be more physically active. Her last functional evaluation was a normal treadmill stress test in December 2015 2. Excellent blood pressure control 3. Lipid parameters are borderline acceptable, likely to improve with more physical exercise, reduced weight and better glycemic control 4. On metformin monotherapy with most recent hemoglobin A1c 6.6%    Medication Adjustments/Labs and Tests Ordered: Current medicines are reviewed at length with the patient today.  Concerns regarding medicines are  outlined above.  Medication changes, Labs and Tests ordered today are listed in the Patient Instructions below. Patient Instructions  Dr. Sallyanne Kuster recommends that you schedule a follow-up appointment in: ONE YEAR        SignedSanda Klein, MD  08/23/2015 12:13 PM    [address], [place], [place]  [id number] Phone: [phone]; Fax: [phone]

## 2015-09-18 ENCOUNTER — Telehealth: Payer: Self-pay | Admitting: Family Medicine

## 2015-09-18 NOTE — Telephone Encounter (Signed)
Last office visit 10/22/2014 for Medicare Wellness.  AVS states to follow up in 6 month for DM.  No future appointments scheduled.  Refill?

## 2015-09-18 NOTE — Telephone Encounter (Signed)
Please call and schedule Medicare Wellness with fasting labs prior for sometime after 11/06/2015 with Dr. Diona Browner.

## 2015-09-18 NOTE — Telephone Encounter (Signed)
Needs appt for medicare well with labs prior before further refills after this one

## 2015-09-21 NOTE — Telephone Encounter (Signed)
Left message asking pt to call office Dr Diona Browner next cpx 6/27

## 2015-09-23 NOTE — Telephone Encounter (Signed)
Labs 6/29 cpx 7/7

## 2015-09-28 DIAGNOSIS — R05 Cough: Secondary | ICD-10-CM | POA: Diagnosis not present

## 2015-09-28 DIAGNOSIS — J387 Other diseases of larynx: Secondary | ICD-10-CM | POA: Diagnosis not present

## 2015-09-28 DIAGNOSIS — Z79899 Other long term (current) drug therapy: Secondary | ICD-10-CM | POA: Diagnosis not present

## 2015-09-28 DIAGNOSIS — R490 Dysphonia: Secondary | ICD-10-CM | POA: Diagnosis not present

## 2015-10-05 DIAGNOSIS — E039 Hypothyroidism, unspecified: Secondary | ICD-10-CM | POA: Diagnosis not present

## 2015-10-05 DIAGNOSIS — E1165 Type 2 diabetes mellitus with hyperglycemia: Secondary | ICD-10-CM | POA: Diagnosis not present

## 2015-10-05 DIAGNOSIS — E7800 Pure hypercholesterolemia, unspecified: Secondary | ICD-10-CM | POA: Diagnosis not present

## 2015-10-09 DIAGNOSIS — E1165 Type 2 diabetes mellitus with hyperglycemia: Secondary | ICD-10-CM | POA: Diagnosis not present

## 2015-10-09 DIAGNOSIS — E7800 Pure hypercholesterolemia, unspecified: Secondary | ICD-10-CM | POA: Diagnosis not present

## 2015-10-09 DIAGNOSIS — I10 Essential (primary) hypertension: Secondary | ICD-10-CM | POA: Diagnosis not present

## 2015-10-09 DIAGNOSIS — E039 Hypothyroidism, unspecified: Secondary | ICD-10-CM | POA: Diagnosis not present

## 2015-10-19 ENCOUNTER — Other Ambulatory Visit: Payer: Self-pay | Admitting: Cardiovascular Disease

## 2015-10-20 NOTE — Telephone Encounter (Signed)
Rx(s) sent to pharmacy electronically.  

## 2015-11-11 ENCOUNTER — Telehealth: Payer: Self-pay | Admitting: Family Medicine

## 2015-11-11 NOTE — Telephone Encounter (Signed)
Pt made medicare wellness 6/29 with leisa

## 2015-11-11 NOTE — Telephone Encounter (Signed)
LM for pt to sch AWV with Lesia on 6/29 at 9am, in addition to already scheduled labs, mn

## 2015-12-29 ENCOUNTER — Other Ambulatory Visit: Payer: Self-pay | Admitting: Family Medicine

## 2016-01-14 ENCOUNTER — Ambulatory Visit (INDEPENDENT_AMBULATORY_CARE_PROVIDER_SITE_OTHER): Payer: PPO

## 2016-01-14 ENCOUNTER — Telehealth: Payer: Self-pay | Admitting: Family Medicine

## 2016-01-14 ENCOUNTER — Other Ambulatory Visit (INDEPENDENT_AMBULATORY_CARE_PROVIDER_SITE_OTHER): Payer: PPO

## 2016-01-14 VITALS — BP 122/70 | HR 67 | Temp 97.9°F | Ht 62.5 in | Wt 151.5 lb

## 2016-01-14 DIAGNOSIS — E119 Type 2 diabetes mellitus without complications: Secondary | ICD-10-CM

## 2016-01-14 DIAGNOSIS — R7989 Other specified abnormal findings of blood chemistry: Secondary | ICD-10-CM

## 2016-01-14 DIAGNOSIS — Z1159 Encounter for screening for other viral diseases: Secondary | ICD-10-CM | POA: Diagnosis not present

## 2016-01-14 DIAGNOSIS — Z0000 Encounter for general adult medical examination without abnormal findings: Secondary | ICD-10-CM

## 2016-01-14 DIAGNOSIS — D509 Iron deficiency anemia, unspecified: Secondary | ICD-10-CM | POA: Diagnosis not present

## 2016-01-14 DIAGNOSIS — E7800 Pure hypercholesterolemia, unspecified: Secondary | ICD-10-CM | POA: Diagnosis not present

## 2016-01-14 LAB — CBC WITH DIFFERENTIAL/PLATELET
BASOS ABS: 0 10*3/uL (ref 0.0–0.1)
Basophils Relative: 0.6 % (ref 0.0–3.0)
EOS ABS: 0.1 10*3/uL (ref 0.0–0.7)
Eosinophils Relative: 1.3 % (ref 0.0–5.0)
HCT: 35.4 % — ABNORMAL LOW (ref 36.0–46.0)
Hemoglobin: 11.9 g/dL — ABNORMAL LOW (ref 12.0–15.0)
LYMPHS ABS: 2 10*3/uL (ref 0.7–4.0)
Lymphocytes Relative: 29.8 % (ref 12.0–46.0)
MCHC: 33.5 g/dL (ref 30.0–36.0)
MCV: 86 fl (ref 78.0–100.0)
MONO ABS: 0.4 10*3/uL (ref 0.1–1.0)
Monocytes Relative: 5.4 % (ref 3.0–12.0)
NEUTROS ABS: 4.2 10*3/uL (ref 1.4–7.7)
NEUTROS PCT: 62.9 % (ref 43.0–77.0)
PLATELETS: 255 10*3/uL (ref 150.0–400.0)
RBC: 4.12 Mil/uL (ref 3.87–5.11)
RDW: 13.6 % (ref 11.5–15.5)
WBC: 6.7 10*3/uL (ref 4.0–10.5)

## 2016-01-14 LAB — COMPREHENSIVE METABOLIC PANEL
ALT: 30 U/L (ref 0–35)
AST: 30 U/L (ref 0–37)
Albumin: 4.7 g/dL (ref 3.5–5.2)
Alkaline Phosphatase: 46 U/L (ref 39–117)
BUN: 17 mg/dL (ref 6–23)
CHLORIDE: 102 meq/L (ref 96–112)
CO2: 29 meq/L (ref 19–32)
Calcium: 9.6 mg/dL (ref 8.4–10.5)
Creatinine, Ser: 0.89 mg/dL (ref 0.40–1.20)
GFR: 66.34 mL/min (ref >60.00)
GLUCOSE: 107 mg/dL — AB (ref 70–99)
POTASSIUM: 4.6 meq/L (ref 3.5–5.1)
Sodium: 137 mEq/L (ref 135–145)
Total Bilirubin: 0.5 mg/dL (ref 0.2–1.2)
Total Protein: 7.6 g/dL (ref 6.0–8.3)

## 2016-01-14 LAB — LDL CHOLESTEROL, DIRECT: Direct LDL: 73 mg/dL

## 2016-01-14 LAB — LIPID PANEL
CHOL/HDL RATIO: 3
CHOLESTEROL: 147 mg/dL (ref 0–200)
HDL: 51.2 mg/dL (ref >39.00)
NonHDL: 96.26
TRIGLYCERIDES: 227 mg/dL — AB (ref 0.0–149.0)
VLDL: 45.4 mg/dL — AB (ref 0.0–40.0)

## 2016-01-14 LAB — HEMOGLOBIN A1C: Hgb A1c MFr Bld: 6.4 % (ref 4.6–6.5)

## 2016-01-14 NOTE — Progress Notes (Signed)
PCP notes  Health maintenance:  Hep C screening - completed A1C - completed Tetanus - postponed/insurance Foot exam - will complete at CPE Eye exam - completed 04/24/2015  Abnormal screenings: None  Patient concerns: Pt is concerned about the presence of nodes on joints of fingers. Pt wants to discuss blood tests that can be used to determine if she has RA.   Nurse concerns: None  Next PCP appt: 01/22/16 @ 1115

## 2016-01-14 NOTE — Telephone Encounter (Signed)
-----   Message from Marchia Bond sent at 01/08/2016 10:49 AM EDT ----- Regarding: Cpx labs Thurs 6/29, need orders. Thanks! :-) Please order  future cpx labs for pt's upcoming lab appt. Thanks Aniceto Boss

## 2016-01-14 NOTE — Progress Notes (Signed)
Subjective:   Valerie Vargas is a 70 y.o. female who presents for Medicare Annual (Subsequent) preventive examination.  Review of Systems:  N/A Cardiac Risk Factors include: advanced age (>65men, >27 women);diabetes mellitus;dyslipidemia;hypertension     Objective:     Vitals: BP 122/70 mmHg  Pulse 67  Temp(Src) 97.9 F (36.6 C) (Oral)  Ht 5' 2.5" (1.588 m)  Wt 151 lb 8 oz (68.72 kg)  BMI 27.25 kg/m2  SpO2 96%  Body mass index is 27.25 kg/(m^2).   Tobacco History  Smoking status  . Never Smoker   Smokeless tobacco  . Never Used     Counseling given: No   Past Medical History  Diagnosis Date  . Cough   . Chronic interstitial cystitis   . Unspecified hypothyroidism   . Allergy, unspecified not elsewhere classified   . Paroxysmal ventricular tachycardia ([place])   . Type II or unspecified type diabetes mellitus without mention of complication, not stated as uncontrolled   . Unspecified asthma(493.90)   . Irritable bowel syndrome   . Esophageal reflux   . Obstructive sleep apnea (adult) (pediatric)   . Ventricular tachycardia ([place])   . Nonocclusive coronary atherosclerosis of native coronary artery   . Hypertension   . Hypertriglyceridemia   . H/O syncope 2007    No recurrence;Tilt table 09/23/05-negative  . Hyperlipidemia 08/22/2014   Past Surgical History  Procedure Laterality Date  . Cholecystectomy  07/2000  . Abdominal hysterectomy  11/1979    for endometriosis  . Esophagogastroduodenoscopy endoscopy  2010   Family History  Problem Relation Age of Onset  . Diabetes Mother   . Breast cancer Mother   . Arthritis Mother   . Diabetes Father   . Stomach cancer Father   . Heart disease Father   . Leukemia Brother    History  Sexual Activity  . Sexual Activity: No    Outpatient Encounter Prescriptions as of 01/14/2016  Medication Sig  . albuterol (PROVENTIL HFA;VENTOLIN HFA) 108 (90 BASE) MCG/ACT inhaler Inhale 2 puffs into the lungs every 6 (six) hours  as needed for wheezing or shortness of breath.  Marland Kitchen albuterol (PROVENTIL,VENTOLIN) 90 MCG/ACT inhaler Inhale 2 puffs into the lungs as needed.    . ALPRAZolam (XANAX) 0.5 MG tablet TAKE ONE (1) TABLET EACH DAY AS NEEDED  . Ascorbic Acid (VITAMIN C) 1000 MG tablet Take 1,000 mg by mouth daily.    Marland Kitchen aspirin 81 MG tablet Take 81 mg by mouth daily.    . benzonatate (TESSALON) 100 MG capsule Take 2 capsules (200 mg total) by mouth 3 (three) times daily as needed for cough.  . Cholecalciferol (VITAMIN D3) 1000 UNITS CAPS Take 1 capsule by mouth daily.    . Coenzyme Q10 (CO Q 10) 100 MG CAPS Take 400 mg by mouth daily.    . Diphenhydramine-PE-APAP (BENADRYL ALLERGY/SINUS HEADACH) 12.5-5-325 MG TABS Take 0.5 tablets by mouth as needed.   . gabapentin (NEURONTIN) 100 MG tablet Take 300 mg by mouth 3 (three) times daily.    Marland Kitchen glucose blood (ONE TOUCH ULTRA TEST) test strip TEST TWICE DAILY  Dx: E11.9  . Guaifenesin 1200 MG TB12 Take by mouth as needed.   Marland Kitchen L-Theanine 100 MG CAPS Take 2 tablets by mouth as needed.   Marland Kitchen levothyroxine (SYNTHROID, LEVOTHROID) 75 MCG tablet Take 75 mcg by mouth daily before breakfast.  . loratadine (CLARITIN) 10 MG tablet Take 10 mg by mouth daily.  Marland Kitchen losartan (COZAAR) 50 MG tablet TAKE 1  TABLET BY MOUTH EVERY DAY  . MAGNESIUM GLYCINATE PLUS PO Take 1 capsule by mouth daily.   . metFORMIN (GLUCOPHAGE) 500 MG tablet Take 500 mg by mouth 2 (two) times daily with a meal.   . metoprolol succinate (TOPROL-XL) 50 MG 24 hr tablet Take 1 tablet (50 mg total) by mouth daily.  . OMEGA-3 1000 MG CAPS Take 1,000 mg by mouth daily.    Glory Rosebush DELICA LANCETS 99991111 MISC TEST TWICE DAILY  Dx: E11.9  . promethazine-dextromethorphan (PROMETHAZINE-DM) 6.25-15 MG/5ML syrup Take 5 mLs by mouth 4 (four) times daily as needed for cough.  . rosuvastatin (CRESTOR) 10 MG tablet TAKE 1 TABLET BY MOUTH EVERY DAY FOR CHOLESTEROL  . Thiamine HCl (VITAMIN B-1) 250 MG tablet Take 250 mg by mouth daily.    .  vitamin B-12 (CYANOCOBALAMIN) 1000 MCG tablet Take 1,000 mcg by mouth daily.    . [DISCONTINUED] omeprazole (PRILOSEC) 40 MG capsule Take 40 mg by mouth 2 (two) times daily.  . [DISCONTINUED] SYNTHROID 75 MCG tablet Take 1 tablet by mouth daily. 6 days a week   No facility-administered encounter medications on file as of 01/14/2016.    Activities of Daily Living In your present state of health, do you have any difficulty performing the following activities: 01/14/2016  Hearing? Y  Vision? N  Difficulty concentrating or making decisions? N  Walking or climbing stairs? N  Dressing or bathing? N  Doing errands, shopping? N  Preparing Food and eating ? N  Using the Toilet? N  In the past six months, have you accidently leaked urine? N  Do you have problems with loss of bowel control? N  Managing your Medications? N  Managing your Finances? N  Housekeeping or managing your Housekeeping? 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 Surgery recommended. Hearing aids will be ineffective.  Vision Screening Comments: Last vision screening was Oct 2016  Exercise Activities and Dietary recommendations Current Exercise Habits: The patient does not participate in regular exercise at present, Exercise limited by: orthopedic condition(s)  Goals    . Weight < 200 lb (90.719 kg)     Weight loss target is 20 lbs. Starting 01/14/2016, I will attempt to drink at least 6-8 glasses daily.       Fall Risk Fall Risk  01/14/2016 08/15/2013  Falls in the past year? No No    Depression Screen PHQ 2/9 Scores 01/14/2016 11/06/2014 08/15/2013  PHQ - 2 Score 0 0 0     Cognitive Testing MMSE - Mini Mental State Exam 01/14/2016  Orientation to time 5  Orientation to Place 5  Registration 3  Attention/ Calculation 0  Recall 3  Language- name 2 objects 0  Language- repeat 1  Language- follow 3 step command 3  Language- read & follow direction 0  Write a sentence 0  Copy design 0  Total score 20   PLEASE NOTE: A Mini-Cog screen was completed. Maximum score is 20. A value of 0 denotes this part of [place] MMSE was not completed or the patient failed this part of the Mini-Cog screening.   Mini-Cog Screening Orientation  to Time - Max 5 pts Orientation to Place - Max 5 pts Registration - Max 3 pts Recall - Max 3 pts Language Repeat - Max 1 pts Language Follow 3 Step Command - Max 3 pts   Immunization History  Administered Date(s) Administered  . Influenza Split 05/15/2013  . Influenza-Unspecified 05/01/2014  . Pneumococcal Conjugate-13 08/15/2012  . Pneumococcal Polysaccharide-23 11/06/2014  . Zoster 03/06/2013   Screening Tests Health Maintenance  Topic Date Due  . FOOT EXAM  01/22/2016 (Originally 11/06/2015)  . INFLUENZA VACCINE  02/16/2016  . MAMMOGRAM  04/23/2016  . OPHTHALMOLOGY EXAM  04/23/2016  . HEMOGLOBIN A1C  07/15/2016  . COLONOSCOPY  03/16/2023  . TETANUS/TDAP  06/02/2025  . DEXA SCAN  Completed  . ZOSTAVAX  Completed  . Hepatitis C Screening  Completed  . PNA vac Low Risk Adult  Completed      Plan:     I have personally reviewed and addressed the Medicare Annual Wellness questionnaire and have noted the following in the patient's chart:  A. Medical and social history B. Use of alcohol, tobacco or illicit drugs  C. Current medications and supplements D. Functional ability and status E.  Nutritional status F.  Physical activity G. Advance directives H. List of other physicians I.  Hospitalizations, surgeries, and ER  visits in previous 12 months J.  [place] to include hearing, vision, cognitive, depression L. Referrals and appointments - none  In addition, I have reviewed and discussed with patient certain preventive protocols, quality metrics, and best practice recommendations. A written personalized care plan for preventive services as well as general preventive health recommendations were provided to patient.  See attached scanned questionnaire for additional information.   Signed,   Lindell Noe, MHA, BS, LPN Health Advisor

## 2016-01-14 NOTE — Progress Notes (Signed)
Pre visit review using our clinic review tool, if applicable. No additional management support is needed unless otherwise documented below in the visit note. 

## 2016-01-14 NOTE — Progress Notes (Signed)
I reviewed health advisor's note, was available for consultation, and agree with documentation and plan.  Lenvil Swaim, MD [facility]  

## 2016-01-14 NOTE — Patient Instructions (Signed)
[name] , Thank you for taking time to come for your Medicare Wellness Visit. I appreciate your ongoing commitment to your health goals. Please review the following plan we discussed and let me know if I can assist you in the future.   These are the goals we discussed: Goals    . Weight < 200 lb (90.719 kg)     Weight loss target is 20 lbs. Starting 01/14/2016, I will attempt to drink at least 6-8 glasses daily.        This is a list of the screening recommended for you and due dates:  Health Maintenance  Topic Date Due  . Complete foot exam   01/22/2016*  . Flu Shot  02/16/2016  . Mammogram  04/23/2016  . Eye exam for diabetics  04/23/2016  . Hemoglobin A1C  07/15/2016  . Colon Cancer Screening  03/16/2023  . Tetanus Vaccine  06/02/2025  . DEXA scan (bone density measurement)  Completed  . Shingles Vaccine  Completed  .  Hepatitis C: One time screening is recommended by Center for Disease Control  (CDC) for  adults born from [birth year] through 1967.   Completed  . Pneumonia vaccines  Completed  *Topic was postponed. The date shown is not the original due date.    Preventive Care for Adults  A healthy lifestyle and preventive care can promote health and wellness. Preventive health guidelines for adults include the following key practices.  . A routine yearly physical is a good way to check with your health care provider about your health and preventive screening. It is a chance to share any concerns and updates on your health and to receive a thorough exam.  . Visit your dentist for a routine exam and preventive care every 6 months. Brush your teeth twice a day and floss once a day. Good oral hygiene prevents tooth decay and gum disease.  . The frequency of eye exams is based on your age, health, family medical history, use  of contact lenses, and other factors. Follow your health care provider's ecommendations for frequency of eye exams.  . Eat a healthy diet. Foods like  vegetables, fruits, whole grains, low-fat dairy products, and lean protein foods contain the nutrients you need without too many calories. Decrease your intake of foods high in solid fats, added sugars, and salt. Eat the right amount of calories for you. Get information about a proper diet from your health care provider, if necessary.  . Regular physical exercise is one of the most important things you can do for your health. Most adults should get at least 150 minutes of moderate-intensity exercise (any activity that increases your heart rate and causes you to sweat) each week. In addition, most adults need muscle-strengthening exercises on 2 or more days a week.  Silver Sneakers may be a benefit available to you. To determine eligibility, you may visit the website: www.silversneakers.com or contact program at 256-067-8722 Mon-Fri between 8AM-8PM.   . Maintain a healthy weight. The body mass index (BMI) is a screening tool to identify possible weight problems. It provides an estimate of body fat based on height and weight. Your health care provider can find your BMI and can help you achieve or maintain a healthy weight.   For adults 20 years and older: ? A BMI below 18.5 is considered underweight. ? A BMI of 18.5 to 24.9 is normal. ? A BMI of 25 to 29.9 is considered overweight. ? A BMI of 30 and above  is considered obese.   . Maintain normal blood lipids and cholesterol levels by exercising and minimizing your intake of saturated fat. Eat a balanced diet with plenty of fruit and vegetables. Blood tests for lipids and cholesterol should begin at age 57 and be repeated every 5 years. If your lipid or cholesterol levels are high, you are over 50, or you are at high risk for heart disease, you may need your cholesterol levels checked more frequently. Ongoing high lipid and cholesterol levels should be treated with medicines if diet and exercise are not working.  . If you smoke, find out from your  health care provider how to quit. If you do not use tobacco, please do not start.  . If you choose to drink alcohol, please do not consume more than 2 drinks per day. One drink is considered to be 12 ounces (355 mL) of beer, 5 ounces (148 mL) of wine, or 1.5 ounces (44 mL) of liquor.  . If you are 70-64 years old, ask your health care provider if you should take aspirin to prevent strokes.  . Use sunscreen. Apply sunscreen liberally and repeatedly throughout the day. You should seek shade when your shadow is shorter than you. Protect yourself by wearing long sleeves, pants, a wide-brimmed hat, and sunglasses year round, whenever you are outdoors.  . Once a month, do a whole body skin exam, using a mirror to look at the skin on your back. Tell your health care provider of new moles, moles that have irregular borders, moles that are larger than a pencil eraser, or moles that have changed in shape or color.

## 2016-01-15 LAB — HEPATITIS C ANTIBODY: HCV Ab: NEGATIVE

## 2016-01-18 ENCOUNTER — Other Ambulatory Visit: Payer: Self-pay | Admitting: Family Medicine

## 2016-01-22 ENCOUNTER — Ambulatory Visit (INDEPENDENT_AMBULATORY_CARE_PROVIDER_SITE_OTHER): Payer: PPO | Admitting: Family Medicine

## 2016-01-22 ENCOUNTER — Encounter: Payer: Self-pay | Admitting: Family Medicine

## 2016-01-22 VITALS — BP 138/67 | HR 81 | Temp 97.5°F | Ht 62.5 in | Wt 152.2 lb

## 2016-01-22 DIAGNOSIS — M545 Low back pain, unspecified: Secondary | ICD-10-CM | POA: Insufficient documentation

## 2016-01-22 DIAGNOSIS — E119 Type 2 diabetes mellitus without complications: Secondary | ICD-10-CM

## 2016-01-22 DIAGNOSIS — E038 Other specified hypothyroidism: Secondary | ICD-10-CM

## 2016-01-22 DIAGNOSIS — I10 Essential (primary) hypertension: Secondary | ICD-10-CM

## 2016-01-22 DIAGNOSIS — E7800 Pure hypercholesterolemia, unspecified: Secondary | ICD-10-CM

## 2016-01-22 DIAGNOSIS — M19042 Primary osteoarthritis, left hand: Secondary | ICD-10-CM

## 2016-01-22 DIAGNOSIS — M25562 Pain in left knee: Secondary | ICD-10-CM | POA: Diagnosis not present

## 2016-01-22 DIAGNOSIS — M19041 Primary osteoarthritis, right hand: Secondary | ICD-10-CM | POA: Diagnosis not present

## 2016-01-22 DIAGNOSIS — D509 Iron deficiency anemia, unspecified: Secondary | ICD-10-CM | POA: Diagnosis not present

## 2016-01-22 DIAGNOSIS — M19049 Primary osteoarthritis, unspecified hand: Secondary | ICD-10-CM | POA: Insufficient documentation

## 2016-01-22 LAB — HM DIABETES FOOT EXAM

## 2016-01-22 MED ORDER — MELOXICAM 15 MG PO TABS: 15.0000 mg | ORAL_TABLET | Freq: Every day | ORAL | Status: DC

## 2016-01-22 NOTE — Assessment & Plan Note (Signed)
Well controlled. Continue current medication.  

## 2016-01-22 NOTE — Assessment & Plan Note (Signed)
Stable control. 

## 2016-01-22 NOTE — Patient Instructions (Signed)
Consider glucosamine for inflammation in knees.  Can try meloxicam as needed for pain in knee , hands and back.  Make follow up if pain not improving.

## 2016-01-22 NOTE — Progress Notes (Signed)
Pre visit review using our clinic review tool, if applicable. No additional management support is needed unless otherwise documented below in the visit note. 

## 2016-01-22 NOTE — Assessment & Plan Note (Signed)
Well controlled. Continue current medication. Encouraged exercise, weight loss, healthy eating habits.  

## 2016-01-22 NOTE — Assessment & Plan Note (Signed)
Likely OA less likely meniscal injury.  Treat with home PT, glucosamine. Can try meloxicam if needed.  If not improving eval with X-ray.

## 2016-01-22 NOTE — Assessment & Plan Note (Signed)
LDL at goal < 100 on crestor. Work on low carb diet to lower trigs.

## 2016-01-22 NOTE — Progress Notes (Signed)
70 year old female present for AMW PArt 2.  She saw Candis Musa, LPN for medicare wellness on 01/14/2016 Note reviewed in detail when completed. Patient concerns at [facility]: 1: Pt is concerned about the presence of nodes on joints of fingers. Pt wants to discuss blood tests that can be used to determine if she has RA.  Let knee pain, worse with flexion and extension, off and on x 5 months.  No known injury. No redness or swelling in knee.  Occ using ice pack , using tylenol daily as well as her chronic Neurontin.  Low back pain., hx of herniated disc in lumbar back. No radiculopathy. No numbness, no weakness.      With history including the following:  She was very healthy until 2001... fatigue, SOB, palpitations, syncope, choking.  Saw Pulmonologist, Cardiologist.  Dx Asthma, GERD.. But meds did not help. (Singulair)  Stress test showed tachycardia... Started on toprol.  Saw Dr Duwayne Heck...dx then with sleep apnea.  Improved some.  Saw alternative med DR.: Dr. Deirdre Pippins. Dx with vit D and other vit deficiency... Felt much better.  Also referred to [facility] (Dr. Cephus Richer) where dx with larynx swelling (vocal cord dysfunction)....coughing proceeds attacks.  She is on gabapentin to prevents this... Can take more if having an episode.  Usually every few months. Unknown trigger.  Endoscopy in last few months was normal.   Tessalon perles and gabapentin controlling cough from laryngeal swelling. Now followed at [facility] By Dr. Joya Gaskins.   Hypertension: well controlled on cozaar  BP Readings from Last 3 Encounters:  01/22/16 138/67  01/14/16 122/70  08/21/15 114/70  Using medication without problems or lightheadedness: none  Chest pain with exertion: none  Edema:none  Short of breath:none  Average home [id number]  Other issues:  occ pain in central back to left chest, improved with belching. Not associated with exertion.  Was on prilosec but concerned  about SE. No reflux on natural med.  Elevated Cholesterol: LDL at goal on crestor , trig elevated Lab Results  Component Value Date   CHOL 147 01/14/2016   HDL 51.20 01/14/2016   LDLCALC 71 10/30/2014   LDLDIRECT 73.0 01/14/2016   TRIG 227.0* 01/14/2016   CHOLHDL 3 01/14/2016  Using medications without problems:None  Muscle aches: None  Diet compliance: moderate due to diet issues  Exercise: Occ.  Other complaints:   Diabetes: On glucophage, occ forgets to take it. Lab Results  Component Value Date   HGBA1C 6.4 01/14/2016  Using medications without difficulties:  Hypoglycemic episodes:?  Hyperglycemic episodes:?  Feet problems:sees podiatrist  Blood Sugars averaging:occ checking fasting 125 eye exam within last year: yes   Hypothyroid: followed by Dr. Soyla Murphy.   Social History /Family History/Past Medical History reviewed and updated if needed.   Review of Systems  Constitutional: Negative for fever and fatigue.  HENT: Negative for ear pain.  Eyes: Negative for pain.  Respiratory: Negative for chest tightness and shortness of breath.  Cardiovascular: Negative for chest pain, palpitations and leg swelling.  Gastrointestinal: Negative for abdominal pain.  Genitourinary: Negative for dysuria.      Objective:  Physical Exam  Constitutional: Vital signs are normal. She appears well-developed and well-nourished. She is cooperative. Non-toxic appearance. She does not appear ill. No distress.  HENT:  Head: Normocephalic.  Right Ear: Hearing, tympanic membrane, external ear and ear canal normal.  Left Ear: Hearing, tympanic membrane, external ear and ear canal normal.  Nose: Nose normal.  Eyes: Conjunctivae normal, EOM  and lids are normal. Pupils are equal, round, and reactive to light. No foreign bodies found.  Neck: Trachea normal and normal range of motion. Neck supple. Carotid bruit is not present. No mass and no thyromegaly present.   Cardiovascular: Normal rate, regular rhythm, S1 normal, S2 normal, normal heart sounds and intact distal pulses. Exam reveals no gallop.  No murmur heard. Pulmonary/Chest: Effort normal and breath sounds normal. No respiratory distress. She has no wheezes. She has no rhonchi. She has no rales.  Abdominal: Soft. Normal appearance and bowel sounds are normal. She exhibits no distension, no fluid wave, no abdominal bruit and no mass. There is no hepatosplenomegaly. There is no tenderness. There is no rebound, no guarding and no CVA tenderness. No hernia.  Genitourinary: No breast swelling, tenderness, discharge or bleeding. Pelvic exam was performed with patient supine.   Vag exam not indicated.  Lymphadenopathy:   She has no cervical adenopathy.   She has no axillary adenopathy.  Neurological: She is alert. She has normal strength. No cranial nerve deficit or sensory deficit.  Skin: Skin is warm, dry and intact. No rash noted.  Psychiatric: Her speech is normal and behavior is normal. Judgment normal. Her mood appears not anxious. Cognition and memory are normal. She does not exhibit a depressed mood.    MSK:  Lumbar back: ttp on left , neg SLR, neg faber's  LEFT knee, no tenderness to palpation, full ROM.Marland Kitchen Pt notes pain in medial knee with flexioin.                     neg McMurrary's and ant posterior drawer                      no swelling, no effusion Bilateral hands: Deformity and nodules in all fingers in PIP and DIP joints, nml MCP joint, no                           redness or warmth.   Diabetic foot exam: Normal inspection No skin breakdown No calluses  Normal DP pulses Normal sensation to light touch and monofilament Nails normal    Assessment & Plan:  The patient's preventative maintenance and recommended screening tests for an annual wellness exam were reviewed in full today. Brought up to date unless services declined.  Counselled on the importance of  diet, exercise, and its role in overall health and mortality. The patient's FH and SH was reviewed, including their home life, tobacco status, and drug and alcohol status.   Colon: 03/15/2013 nml.. Due for 10 year repeat in 2024. Nonsmoker DEXA: Normal 09/2011, can repeat in 5 years.  Mammogram last 04/2015 PAP not indicated, DVE not indicated total hysterecotmy Vaccine: uptodate  Hep C neg

## 2016-01-22 NOTE — Assessment & Plan Note (Signed)
Followed by Dr. Balan ENDO. 

## 2016-01-22 NOTE — Assessment & Plan Note (Addendum)
Likely MSK vs OA. Treat with NSAIDs and home PT, ice, massage.

## 2016-01-22 NOTE — Assessment & Plan Note (Signed)
No clear indication for RA work up as nodule in joint in hands consistent with Heberden's nodules. No other small joint issues. No family history of RA known.

## 2016-02-03 ENCOUNTER — Other Ambulatory Visit: Payer: Self-pay | Admitting: Family Medicine

## 2016-02-03 NOTE — Telephone Encounter (Signed)
Last office visit 01/22/2016.  Last refilled 11/06/2014 for #60 with no refills.  Refill?

## 2016-03-30 ENCOUNTER — Other Ambulatory Visit: Payer: Self-pay | Admitting: Family Medicine

## 2016-03-30 DIAGNOSIS — Z1231 Encounter for screening mammogram for malignant neoplasm of breast: Secondary | ICD-10-CM

## 2016-04-05 DIAGNOSIS — E1165 Type 2 diabetes mellitus with hyperglycemia: Secondary | ICD-10-CM | POA: Diagnosis not present

## 2016-04-05 DIAGNOSIS — E7800 Pure hypercholesterolemia, unspecified: Secondary | ICD-10-CM | POA: Diagnosis not present

## 2016-04-05 DIAGNOSIS — E039 Hypothyroidism, unspecified: Secondary | ICD-10-CM | POA: Diagnosis not present

## 2016-04-19 DIAGNOSIS — I10 Essential (primary) hypertension: Secondary | ICD-10-CM | POA: Diagnosis not present

## 2016-04-19 DIAGNOSIS — E1165 Type 2 diabetes mellitus with hyperglycemia: Secondary | ICD-10-CM | POA: Diagnosis not present

## 2016-04-19 DIAGNOSIS — E7800 Pure hypercholesterolemia, unspecified: Secondary | ICD-10-CM | POA: Diagnosis not present

## 2016-04-19 DIAGNOSIS — Z23 Encounter for immunization: Secondary | ICD-10-CM | POA: Diagnosis not present

## 2016-04-19 DIAGNOSIS — E039 Hypothyroidism, unspecified: Secondary | ICD-10-CM | POA: Diagnosis not present

## 2016-04-29 ENCOUNTER — Ambulatory Visit
Admission: RE | Admit: 2016-04-29 | Discharge: 2016-04-29 | Disposition: A | Discharge status: Home / Self Care | Payer: PPO | Source: Ambulatory Visit | Attending: Family Medicine | Admitting: Family Medicine

## 2016-04-29 DIAGNOSIS — Z1231 Encounter for screening mammogram for malignant neoplasm of breast: Secondary | ICD-10-CM | POA: Diagnosis not present

## 2016-04-29 DIAGNOSIS — E119 Type 2 diabetes mellitus without complications: Secondary | ICD-10-CM | POA: Diagnosis not present

## 2016-04-29 DIAGNOSIS — H524 Presbyopia: Secondary | ICD-10-CM | POA: Diagnosis not present

## 2016-04-29 DIAGNOSIS — H2511 Age-related nuclear cataract, right eye: Secondary | ICD-10-CM | POA: Diagnosis not present

## 2016-04-29 DIAGNOSIS — Z961 Presence of intraocular lens: Secondary | ICD-10-CM | POA: Diagnosis not present

## 2016-04-29 LAB — HM DIABETES EYE EXAM

## 2016-05-21 ENCOUNTER — Encounter: Payer: Self-pay | Admitting: Family Medicine

## 2016-05-24 ENCOUNTER — Encounter: Payer: Self-pay | Admitting: Family Medicine

## 2016-06-02 ENCOUNTER — Other Ambulatory Visit: Payer: Self-pay | Admitting: *Deleted

## 2016-06-02 MED ORDER — BENZONATATE 100 MG PO CAPS: ORAL_CAPSULE | ORAL | 1 refills | Status: DC

## 2016-06-02 NOTE — Telephone Encounter (Signed)
Last office visit 01/22/2016.  Last refilled 02/03/2016 for #60 with 1 refill.  Ok to refill?

## 2016-09-21 ENCOUNTER — Other Ambulatory Visit: Payer: Self-pay | Admitting: Cardiovascular Disease

## 2016-09-21 ENCOUNTER — Other Ambulatory Visit: Payer: Self-pay | Admitting: Family Medicine

## 2016-10-12 DIAGNOSIS — R490 Dysphonia: Secondary | ICD-10-CM | POA: Diagnosis not present

## 2016-10-12 DIAGNOSIS — R05 Cough: Secondary | ICD-10-CM | POA: Diagnosis not present

## 2016-10-12 DIAGNOSIS — J384 Edema of larynx: Secondary | ICD-10-CM | POA: Diagnosis not present

## 2016-10-12 DIAGNOSIS — J387 Other diseases of larynx: Secondary | ICD-10-CM | POA: Diagnosis not present

## 2016-10-18 DIAGNOSIS — E1165 Type 2 diabetes mellitus with hyperglycemia: Secondary | ICD-10-CM | POA: Diagnosis not present

## 2016-10-18 DIAGNOSIS — E7800 Pure hypercholesterolemia, unspecified: Secondary | ICD-10-CM | POA: Diagnosis not present

## 2016-10-18 DIAGNOSIS — E039 Hypothyroidism, unspecified: Secondary | ICD-10-CM | POA: Diagnosis not present

## 2016-10-25 DIAGNOSIS — E1165 Type 2 diabetes mellitus with hyperglycemia: Secondary | ICD-10-CM | POA: Diagnosis not present

## 2016-10-25 DIAGNOSIS — I10 Essential (primary) hypertension: Secondary | ICD-10-CM | POA: Diagnosis not present

## 2016-10-25 DIAGNOSIS — E039 Hypothyroidism, unspecified: Secondary | ICD-10-CM | POA: Diagnosis not present

## 2016-10-25 DIAGNOSIS — E7800 Pure hypercholesterolemia, unspecified: Secondary | ICD-10-CM | POA: Diagnosis not present

## 2016-10-27 ENCOUNTER — Encounter: Payer: Self-pay | Admitting: Cardiovascular Disease

## 2016-10-27 ENCOUNTER — Ambulatory Visit (INDEPENDENT_AMBULATORY_CARE_PROVIDER_SITE_OTHER): Payer: PPO | Admitting: Cardiovascular Disease

## 2016-10-27 VITALS — BP 138/88 | HR 73 | Ht 62.5 in | Wt 152.6 lb

## 2016-10-27 DIAGNOSIS — E782 Mixed hyperlipidemia: Secondary | ICD-10-CM

## 2016-10-27 DIAGNOSIS — Z9189 Other specified personal risk factors, not elsewhere classified: Secondary | ICD-10-CM

## 2016-10-27 DIAGNOSIS — R072 Precordial pain: Secondary | ICD-10-CM | POA: Diagnosis not present

## 2016-10-27 DIAGNOSIS — E119 Type 2 diabetes mellitus without complications: Secondary | ICD-10-CM | POA: Diagnosis not present

## 2016-10-27 DIAGNOSIS — I10 Essential (primary) hypertension: Secondary | ICD-10-CM

## 2016-10-27 NOTE — Progress Notes (Signed)
Patient ID: Valerie Vargas, female   DOB: February 14, 1946, 71 y.o.   MRN: [id number]    Cardiology Office Note    Date:  10/28/2016   ID:  Echo, Propp Dec 12, 1945, MRN [id number]  PCP:  Eliezer Lofts, MD  Cardiologist:   Sanda Klein, MD   No chief complaint on file.   History of Present Illness:  Valerie Vargas is a 71 y.o. female with a remote history of syncope in 2007 and negative subsequent workup, minor coronary artery disease by angiography in 2003, hypertension, hyperlipidemia, type 2 diabetes mellitus .  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 She felt that her stomach was very distended. She had to come down off the steps. She felt a little better after belching but felt tired all day. The pain was severe enough at its peak that she felt she might pass out. He is not experiencing any pain at this time but still feels rather tired. Her ECG is low risk.  In the past a tilt table test demonstrated that she had orthostatic hypotension. She felt that the recent symptoms were similar to that.  She generally feels well without any cardiovascular complaints whatsoever and specifically denies chest pain, dyspnea, edema, claudication, focal neurological events, recurrent syncope. She has rare episodes of dizziness and lightheadedness with changes in position.  She reports that her glycemic control has deteriorated but her hemoglobin A1c remains in usual target range at 6.4%. She has mild hypertriglyceridemia but her cholesterol is in target range on rosuvastatin therapy. She thinks the deterioration is due to the fact that she has been less physically active.   Past Medical History:  Diagnosis Date  . Allergy, unspecified not elsewhere classified   . Chronic interstitial cystitis   . Cough   . Esophageal reflux   . H/O syncope 2007   No recurrence;Tilt table 09/23/05-negative  . Hyperlipidemia 08/22/2014  . Hypertension     . Hypertriglyceridemia   . Irritable bowel syndrome   . Nonocclusive coronary atherosclerosis of native coronary artery   . Obstructive sleep apnea (adult) (pediatric)   . Paroxysmal ventricular tachycardia ([place])   . Type II or unspecified type diabetes mellitus without mention of complication, not stated as uncontrolled   . Unspecified asthma(493.90)   . Unspecified hypothyroidism   . Ventricular tachycardia [facility])     Past Surgical History:  Procedure Laterality Date  . ABDOMINAL HYSTERECTOMY  11/1979   for endometriosis  . CHOLECYSTECTOMY  07/2000  . ESOPHAGOGASTRODUODENOSCOPY ENDOSCOPY  2010    Outpatient Medications Prior to Visit  Medication Sig Dispense Refill  . albuterol (PROVENTIL HFA;VENTOLIN HFA) 108 (90 BASE) MCG/ACT inhaler Inhale 2 puffs into the lungs every 6 (six) hours as needed for wheezing or shortness of breath. 1 Inhaler 2  . ALPRAZolam (XANAX) 0.5 MG tablet TAKE ONE (1) TABLET EACH DAY AS NEEDED 10 tablet 0  . Ascorbic Acid (VITAMIN C) 1000 MG tablet Take 1,000 mg by mouth daily.      Marland Kitchen aspirin 81 MG tablet Take 81 mg by mouth daily.      . benzonatate (TESSALON) 100 MG capsule TAKE 2 CAPSULES BY MOUTH 3 TIMES DAILY AS NEEDED COUGH 60 capsule 1  . Cholecalciferol (VITAMIN D3) 1000 UNITS CAPS Take 1 capsule by mouth daily.      . Coenzyme Q10 (CO Q 10) 100 MG CAPS Take 400 mg by mouth daily.      Marland Kitchen  Diphenhydramine-PE-APAP (BENADRYL ALLERGY/SINUS HEADACH) 12.5-5-325 MG TABS Take 0.5 tablets by mouth as needed.     . gabapentin (NEURONTIN) 100 MG tablet Take 300 mg by mouth 3 (three) times daily.      Marland Kitchen glucose blood (ONE TOUCH ULTRA TEST) test strip TEST TWICE DAILY  Dx: E11.9 100 each 6  . Guaifenesin 1200 MG TB12 Take by mouth as needed.     Marland Kitchen L-Theanine 100 MG CAPS Take 2 tablets by mouth as needed.     Marland Kitchen levothyroxine (SYNTHROID, LEVOTHROID) 75 MCG tablet Take 75 mcg by mouth daily before breakfast.    . loratadine (CLARITIN) 10 MG tablet Take 10 mg by mouth  daily.    Marland Kitchen losartan (COZAAR) 50 MG tablet TAKE 1 TABLET BY MOUTH EVERY DAY 30 tablet 3  . MAGNESIUM GLYCINATE PLUS PO Take 1 capsule by mouth daily.     . meloxicam (MOBIC) 15 MG tablet Take 1 tablet (15 mg total) by mouth daily. 30 tablet 0  . metoprolol succinate (TOPROL-XL) 50 MG 24 hr tablet TAKE 1 TABLET BY MOUTH EVERY DAY 30 tablet 2  . OMEGA-3 1000 MG CAPS Take 1,000 mg by mouth daily.      Glory Rosebush DELICA LANCETS 96E MISC TEST TWICE DAILY  Dx: E11.9 100 each 6  . promethazine-dextromethorphan (PROMETHAZINE-DM) 6.25-15 MG/5ML syrup Take 5 mLs by mouth 4 (four) times daily as needed for cough. 180 mL 0  . rosuvastatin (CRESTOR) 10 MG tablet TAKE 1 TABLET BY MOUTH EVERY DAY FOR CHOLESTEROL 30 tablet 11  . Thiamine HCl (VITAMIN B-1) 250 MG tablet Take 250 mg by mouth daily.      . vitamin B-12 (CYANOCOBALAMIN) 1000 MCG tablet Take 1,000 mcg by mouth daily.      . metFORMIN (GLUCOPHAGE) 500 MG tablet Take 500 mg by mouth 2 (two) times daily with a meal.      No facility-administered medications prior to visit.      Allergies:   Codeine; Hctz [hydrochlorothiazide]; Penicillins; and Sulfa antibiotics   Social History   Social History  . Marital status: Married    Spouse name: N/A  . Number of children: N/A  . Years of education: N/A   Social History Main Topics  . Smoking status: Never Smoker  . Smokeless tobacco: Never Used  . Alcohol use No  . Drug use: No  . Sexual activity: No   Other Topics Concern  . None   Social History Narrative  . None     Family History:  The patient's family history includes Arthritis in her mother; Breast cancer in her mother; Diabetes in her father and mother; Heart disease in her father; Leukemia in her brother; Stomach cancer in her father.   ROS:   Please see the history of present illness.    ROS All other systems reviewed and are negative.   PHYSICAL EXAM:   VS:  BP 138/88   Pulse 73   Ht 5' 2.5" (1.588 m)   Wt 69.2 kg (152  lb 9.6 oz)   BMI 27.47 kg/m    GEN: Well nourished, well developed, in no acute distress  HEENT: normal  Neck: no JVD, carotid bruits, or masses Cardiac: RRR; no murmurs, rubs, or gallops,no edema  Respiratory:  clear to auscultation bilaterally, normal work of breathing GI: soft, nontender, nondistended, + BS MS: no deformity or atrophy  Skin: warm and dry, no rash Neuro:  Alert and Oriented x 3, Strength and sensation are intact Psych:  euthymic mood, full affect  Wt Readings from Last 3 Encounters:  10/27/16 69.2 kg (152 lb 9.6 oz)  01/22/16 69.1 kg (152 lb 4 oz)  01/14/16 68.7 kg (151 lb 8 oz)      Studies/Labs Reviewed:   EKG:  EKG is ordered today.  The ekg ordered today demonstrates sinus rhythm, normal tracing with QTC 425 ms  Recent Labs: 01/14/2016: ALT 30; BUN 17; Creatinine, Ser 0.89; Hemoglobin 11.9; Platelets 255.0; Potassium 4.6; Sodium 137   Lipid Panel    Component Value Date/Time   CHOL 147 01/14/2016 0843   TRIG 227.0 (H) 01/14/2016 0843   HDL 51.20 01/14/2016 0843   CHOLHDL 3 01/14/2016 0843   VLDL 45.4 (H) 01/14/2016 0843   LDLCALC 71 10/30/2014 0925   LDLDIRECT 73.0 01/14/2016 0843     More recent labs from primary care physician show: Hemoglobin A1c 6.4%, creatinine 0.9, normal liver function tests, potassium 5.1, TSH 1.37 Total cholesterol 142, TG 254, HDL 47, LDL 44    ASSESSMENT:    1. Precordial chest pain   2. Essential hypertension   3. Mixed hyperlipidemia   4. Diabetes mellitus with no complication (HCC)   5. At risk for coronary artery disease      PLAN:  In order of problems listed above:  1. Chest pain: No clear symptoms of angina pectoris. Not sure what happened the other day, but it could've been angina pectoris.. Her last functional evaluation was a normal treadmill stress test in December 2015. Prior to that had a normal nuclear stress test in 2012. 2. HTN: Borderline blood pressure control 3. HLP: Cholesterol  parameters are acceptable, TG likely to improve with more physical exercise, reduced to hydrate intake  4. DM: On metformin monotherapy with most recent hemoglobin A1c 6.4% 5. Expresses concern about abdominal aortic aneurysm since her father had one as well. Will perform a screening ultrasound    Medication Adjustments/Labs and Tests Ordered: Current medicines are reviewed at length with the patient today.  Concerns regarding medicines are outlined above.  Medication changes, Labs and Tests ordered today are listed in the Patient Instructions below. Patient Instructions  Medication Instructions: Dr Sallyanne Kuster recommends that you continue on your current medications as directed. Please refer to the Current Medication list given to you today.  Labwork: NONE ORDERED  Testing/Procedures: 1. Exercise Tolerance Test (GXT) - Your physician has requested that you have an exercise tolerance test. For further information please visit [email or web address]. Please also follow instruction sheet, as given.  2. VASCUscreen Test - This test is done in our office. It is to screen carotid artery disease. There is a $50 out-of-pocket charge associated with the test.  Follow-up: Dr Sallyanne Kuster recommends that you schedule a follow-up appointment in 12 months. You will receive a reminder letter in the mail two months in advance. If you don't receive a letter, please call our office to schedule the follow-up appointment.  If you need a refill on your cardiac medications before your next appointment, please call your pharmacy.      Signed, Sanda Klein, MD  10/28/2016 3:51 PM    [address], [place], [place]  [address] Phone: [phone]; Fax: [phone]

## 2016-10-27 NOTE — Patient Instructions (Signed)
Medication Instructions: Dr Sallyanne Kuster recommends that you continue on your current medications as directed. Please refer to the Current Medication list given to you today.  Labwork: NONE ORDERED  Testing/Procedures: 1. Exercise Tolerance Test (GXT) - Your physician has requested that you have an exercise tolerance test. For further information please visit [email or web address]. Please also follow instruction sheet, as given.  2. VASCUscreen Test - This test is done in our office. It is to screen carotid artery disease. There is a $50 out-of-pocket charge associated with the test.  Follow-up: Dr Sallyanne Kuster recommends that you schedule a follow-up appointment in 12 months. You will receive a reminder letter in the mail two months in advance. If you don't receive a letter, please call our office to schedule the follow-up appointment.  If you need a refill on your cardiac medications before your next appointment, please call your pharmacy.

## 2016-11-18 ENCOUNTER — Telehealth (HOSPITAL_COMMUNITY): Payer: Self-pay

## 2016-11-18 NOTE — Telephone Encounter (Signed)
Encounter complete. 

## 2016-11-21 ENCOUNTER — Other Ambulatory Visit: Payer: Self-pay | Admitting: Family Medicine

## 2016-11-22 DIAGNOSIS — R7989 Other specified abnormal findings of blood chemistry: Secondary | ICD-10-CM | POA: Diagnosis not present

## 2016-11-23 ENCOUNTER — Ambulatory Visit (HOSPITAL_COMMUNITY)
Admission: RE | Admit: 2016-11-23 | Discharge: 2016-11-23 | Disposition: A | Discharge status: Home / Self Care | Payer: PPO | Source: Ambulatory Visit | Attending: Cardiology | Admitting: Cardiology

## 2016-11-23 DIAGNOSIS — E119 Type 2 diabetes mellitus without complications: Secondary | ICD-10-CM

## 2016-11-23 DIAGNOSIS — Z9189 Other specified personal risk factors, not elsewhere classified: Secondary | ICD-10-CM

## 2016-11-23 DIAGNOSIS — R072 Precordial pain: Secondary | ICD-10-CM | POA: Diagnosis not present

## 2016-11-23 LAB — EXERCISE TOLERANCE TEST
CSEPED: 8 min
CSEPEW: 10.1 METS
CSEPPHR: 155 {beats}/min
Exercise duration (sec): 0 s
MPHR: 148 {beats}/min
Percent HR: 104 %
RPE: 18
Rest HR: 94 {beats}/min

## 2016-12-23 DIAGNOSIS — A932 Colorado tick fever: Secondary | ICD-10-CM | POA: Diagnosis not present

## 2017-01-16 NOTE — Progress Notes (Deleted)
PCP notes:   Health maintenance: No gaps identified.   Abnormal screenings:    Patient concerns:    Nurse concerns:   Next PCP appt: 01/27/17 @ 10:00am

## 2017-01-16 NOTE — Progress Notes (Deleted)
Subjective:   Valerie Vargas is a 71 y.o. female who presents for Medicare Annual (Subsequent) preventive examination.  Review of Systems:  No ROS.  Medicare Wellness Visit. Additional risk factors are reflected in the social history.        Objective:     Vitals: There were no vitals taken for this visit.  There is no height or weight on file to calculate BMI.   Tobacco History  Smoking Status  . Never Smoker  Smokeless Tobacco  . Never Used     Counseling given: Not Answered   Past Medical History:  Diagnosis Date  . Allergy, unspecified not elsewhere classified   . Chronic interstitial cystitis   . Cough   . Esophageal reflux   . H/O syncope 2007   No recurrence;Tilt table 09/23/05-negative  . Hyperlipidemia 08/22/2014  . Hypertension   . Hypertriglyceridemia   . Irritable bowel syndrome   . Nonocclusive coronary atherosclerosis of native coronary artery   . Obstructive sleep apnea (adult) (pediatric)   . Paroxysmal ventricular tachycardia ([place])   . Type II or unspecified type diabetes mellitus without mention of complication, not stated as uncontrolled   . Unspecified asthma(493.90)   . Unspecified hypothyroidism   . Ventricular tachycardia [facility])    Past Surgical History:  Procedure Laterality Date  . ABDOMINAL HYSTERECTOMY  11/1979   for endometriosis  . CHOLECYSTECTOMY  07/2000  . ESOPHAGOGASTRODUODENOSCOPY ENDOSCOPY  2010   Family History  Problem Relation Age of Onset  . Diabetes Mother   . Breast cancer Mother   . Arthritis Mother   . Diabetes Father   . Stomach cancer Father   . Heart disease Father   . Leukemia Brother    History  Sexual Activity  . Sexual activity: No    Outpatient Encounter Prescriptions as of 01/23/2017  Medication Sig  . ALPRAZolam (XANAX) 0.5 MG tablet TAKE ONE (1) TABLET EACH DAY AS NEEDED  . Ascorbic Acid (VITAMIN C) 1000 MG tablet Take 1,000 mg by mouth daily.    Marland Kitchen aspirin 81 MG tablet Take 81 mg by mouth daily.     . benzonatate (TESSALON) 100 MG capsule TAKE 2 CAPSULES BY MOUTH 3 TIMES DAILY AS NEEDED COUGH  . Cholecalciferol (VITAMIN D3) 1000 UNITS CAPS Take 1 capsule by mouth daily.    . Coenzyme Q10 (CO Q 10) 100 MG CAPS Take 400 mg by mouth daily.    . Diphenhydramine-PE-APAP (BENADRYL ALLERGY/SINUS HEADACH) 12.5-5-325 MG TABS Take 0.5 tablets by mouth as needed.   . gabapentin (NEURONTIN) 100 MG tablet Take 300 mg by mouth 3 (three) times daily.    Marland Kitchen glucose blood (ONE TOUCH ULTRA TEST) test strip TEST TWICE DAILY  Dx: E11.9  . Guaifenesin 1200 MG TB12 Take by mouth as needed.   Marland Kitchen L-Theanine 100 MG CAPS Take 2 tablets by mouth as needed.   Marland Kitchen levothyroxine (SYNTHROID, LEVOTHROID) 75 MCG tablet Take 75 mcg by mouth daily before breakfast.  . loratadine (CLARITIN) 10 MG tablet Take 10 mg by mouth daily.  Marland Kitchen losartan (COZAAR) 50 MG tablet TAKE 1 TABLET BY MOUTH EVERY DAY  . MAGNESIUM GLYCINATE PLUS PO Take 1 capsule by mouth daily.   . meloxicam (MOBIC) 15 MG tablet Take 1 tablet (15 mg total) by mouth daily.  . metoprolol succinate (TOPROL-XL) 50 MG 24 hr tablet TAKE 1 TABLET BY MOUTH EVERY DAY  . OMEGA-3 1000 MG CAPS Take 1,000 mg by mouth daily.    Marland Kitchen  ONETOUCH DELICA LANCETS 03T MISC TEST TWICE DAILY  Dx: E11.9  . promethazine-dextromethorphan (PROMETHAZINE-DM) 6.25-15 MG/5ML syrup Take 5 mLs by mouth 4 (four) times daily as needed for cough.  . rosuvastatin (CRESTOR) 10 MG tablet TAKE 1 TABLET BY MOUTH EVERY DAY FOR CHOLESTEROL  . Thiamine HCl (VITAMIN B-1) 250 MG tablet Take 250 mg by mouth daily.    . VENTOLIN HFA 108 (90 Base) MCG/ACT inhaler USE 2 PUFFS BY MOUTH EVERY 6 HOURS AS NEEDED FOR WHEEZING FOR SHORTNESS OF BREATH  . vitamin B-12 (CYANOCOBALAMIN) 1000 MCG tablet Take 1,000 mcg by mouth daily.     No facility-administered encounter medications on file as of 01/23/2017.     Activities of Daily Living No flowsheet data found.  Patient Care Team: Jinny Sanders, MD as PCP - General  (Family Medicine) Jacelyn Pi, MD as Attending Physician (Endocrinology) Ernestine Conrad, MD as Referring Physician (Otolaryngology) Sanda Klein, MD as Consulting Physician (Cardiology) Jola Schmidt, MD as Consulting Physician (Ophthalmology) Levin Erp, DDS, PA as Referring Physician (Dentistry)    Assessment:    Physical assessment deferred to PCP.  Exercise Activities and Dietary recommendations    Goals    . Weight < 200 lb (90.719 kg)          Weight loss target is 20 lbs. Starting 01/14/2016, I will attempt to drink at least 6-8 glasses daily.       Fall Risk Fall Risk  01/14/2016 08/15/2013  Falls in the past year? No No   Depression Screen PHQ 2/9 Scores 01/14/2016 11/06/2014 08/15/2013  PHQ - 2 Score 0 0 0     Cognitive Function MMSE - Mini Mental State Exam 01/14/2016  Orientation to time 5  Orientation to Place 5  Registration 3  Attention/ Calculation 0  Recall 3  Language- name 2 objects 0  Language- repeat 1  Language- follow 3 step command 3  Language- read & follow direction 0  Write a sentence 0  Copy design 0  Total score 20        Immunization History  Administered Date(s) Administered  . Influenza Split 05/15/2013  . Influenza-Unspecified 05/01/2014, 06/03/2015, 04/19/2016  . Pneumococcal Conjugate-13 08/15/2012  . Pneumococcal Polysaccharide-23 11/06/2014  . Tdap 06/03/2015  . Zoster 03/06/2013   Screening Tests Health Maintenance  Topic Date Due  . FOOT EXAM  01/21/2017  . INFLUENZA VACCINE  02/15/2017  . HEMOGLOBIN A1C  04/19/2017  . MAMMOGRAM  04/29/2017  . OPHTHALMOLOGY EXAM  04/29/2017  . COLONOSCOPY  03/16/2023  . TETANUS/TDAP  06/02/2025  . DEXA SCAN  Completed  . Hepatitis C Screening  Completed  . PNA vac Low Risk Adult  Completed      Plan:    Follow-up w/ PCP as scheduled.   I have personally reviewed and noted the following in the patient's chart:   . Medical and social history . Use of  alcohol, tobacco or illicit drugs  . Current medications and supplements . Functional ability and status . Nutritional status . Physical activity . Advanced directives . List of other physicians . Vitals . Screenings to include cognitive, depression, and falls . Referrals and appointments  In addition, I have reviewed and discussed with patient certain preventive protocols, quality metrics, and best practice recommendations. A written personalized care plan for preventive services as well as general preventive health recommendations were provided to patient.     Dorrene German, RN  01/16/2017

## 2017-01-23 ENCOUNTER — Telehealth: Payer: Self-pay | Admitting: Family Medicine

## 2017-01-23 ENCOUNTER — Other Ambulatory Visit: Payer: PPO

## 2017-01-23 ENCOUNTER — Ambulatory Visit: Payer: PPO

## 2017-01-23 DIAGNOSIS — E038 Other specified hypothyroidism: Secondary | ICD-10-CM

## 2017-01-23 DIAGNOSIS — E119 Type 2 diabetes mellitus without complications: Secondary | ICD-10-CM

## 2017-01-23 DIAGNOSIS — E7800 Pure hypercholesterolemia, unspecified: Secondary | ICD-10-CM

## 2017-01-23 DIAGNOSIS — D508 Other iron deficiency anemias: Secondary | ICD-10-CM

## 2017-01-23 NOTE — Telephone Encounter (Signed)
-----   Message from Ellamae Sia sent at 01/20/2017  9:24 AM EDT ----- Regarding: Lab orders for Monday, 7.9.18 Patient is scheduled for CPX labs, please order future labs, Thanks , Karna Christmas

## 2017-01-24 ENCOUNTER — Other Ambulatory Visit: Payer: Self-pay | Admitting: Cardiovascular Disease

## 2017-01-25 ENCOUNTER — Other Ambulatory Visit: Payer: Self-pay

## 2017-01-25 MED ORDER — METOPROLOL SUCCINATE ER 50 MG PO TB24: 50.0000 mg | ORAL_TABLET | Freq: Every day | ORAL | 2 refills | Status: DC

## 2017-01-25 NOTE — Telephone Encounter (Signed)
REFILL 

## 2017-01-26 ENCOUNTER — Encounter: Payer: Self-pay | Admitting: Cardiovascular Disease

## 2017-01-27 ENCOUNTER — Encounter: Payer: Self-pay | Admitting: *Deleted

## 2017-01-27 ENCOUNTER — Ambulatory Visit: Payer: PPO | Admitting: Family Medicine

## 2017-01-27 NOTE — Telephone Encounter (Signed)
This encounter was created in error - please disregard.

## 2017-01-31 NOTE — Progress Notes (Signed)
PCP notes:   Health maintenance:  Foot exam-due.  DEXA- due.  Abnormal screenings:   Cognitive-abnormal clock drawing, 2/3 recall   Patient concerns:   1) She stopped taking losartan because home BPs were running 90s/50s.   2) ? L foot plantar fasciitis   3) She would like to have carpal tunnel release surgery.   Nurse concerns: BP elevated. She has not taken meds this morning.   Next PCP appt: 02/14/2017 @ 10:00am

## 2017-01-31 NOTE — Progress Notes (Signed)
Subjective:   Valerie Vargas is a 71 y.o. female who presents for Medicare Annual (Subsequent) preventive examination.  Review of Systems:  No ROS.  Medicare Wellness Visit. Additional risk factors are reflected in the social history.  Cardiac Risk Factors include: advanced age (>47men, >68 women);hypertension;diabetes mellitus;dyslipidemia     Objective:     Vitals: BP (!) 158/82 (BP Location: Right Arm, Patient Position: Sitting, Cuff Size: Normal)   Pulse 78   Resp 16   Ht 5' 2.5" (1.588 m)   Wt 146 lb 4 oz (66.3 kg)   SpO2 98%   BMI 26.32 kg/m   Body mass index is 26.32 kg/m.  Wt Readings from Last 3 Encounters:  02/08/17 146 lb 4 oz (66.3 kg)  10/27/16 152 lb 9.6 oz (69.2 kg)  01/22/16 152 lb 4 oz (69.1 kg)    Tobacco History  Smoking Status  . Never Smoker  Smokeless Tobacco  . Never Used     Counseling given: Not Answered   Past Medical History:  Diagnosis Date  . Allergy, unspecified not elsewhere classified   . Chronic interstitial cystitis   . Cough   . Esophageal reflux   . H/O syncope 2007   No recurrence;Tilt table 09/23/05-negative  . Hyperlipidemia 08/22/2014  . Hypertension   . Hypertriglyceridemia   . Irritable bowel syndrome   . Nonocclusive coronary atherosclerosis of native coronary artery   . Obstructive sleep apnea (adult) (pediatric)   . Paroxysmal ventricular tachycardia ([place])   . Type II or unspecified type diabetes mellitus without mention of complication, not stated as uncontrolled   . Unspecified asthma(493.90)   . Unspecified hypothyroidism   . Ventricular tachycardia [facility])    Past Surgical History:  Procedure Laterality Date  . ABDOMINAL HYSTERECTOMY  11/1979   for endometriosis  . CHOLECYSTECTOMY  07/2000  . ESOPHAGOGASTRODUODENOSCOPY ENDOSCOPY  2010   Family History  Problem Relation Age of Onset  . Diabetes Mother   . Breast cancer Mother   . Arthritis Mother   . Diabetes Father   . Stomach cancer Father   .  Heart disease Father   . Leukemia Brother    History  Sexual Activity  . Sexual activity: No    Outpatient Encounter Prescriptions as of 02/08/2017  Medication Sig  . ALPRAZolam (XANAX) 0.5 MG tablet TAKE ONE (1) TABLET EACH DAY AS NEEDED  . Ascorbic Acid (VITAMIN C) 1000 MG tablet Take 1,000 mg by mouth daily.    Marland Kitchen aspirin 81 MG tablet Take 81 mg by mouth daily.    . benzonatate (TESSALON) 100 MG capsule TAKE 2 CAPSULES BY MOUTH 3 TIMES DAILY AS NEEDED COUGH  . Cholecalciferol (VITAMIN D3) 1000 UNITS CAPS Take 1 capsule by mouth daily.    . Coenzyme Q10 (CO Q 10) 100 MG CAPS Take 400 mg by mouth daily.    . Diphenhydramine-PE-APAP (BENADRYL ALLERGY/SINUS HEADACH) 12.5-5-325 MG TABS Take 0.5 tablets by mouth as needed.   . gabapentin (NEURONTIN) 100 MG tablet Take 300 mg by mouth 3 (three) times daily.    Marland Kitchen glucose blood (ONE TOUCH ULTRA TEST) test strip TEST TWICE DAILY  Dx: E11.9  . Guaifenesin 1200 MG TB12 Take by mouth as needed.   Marland Kitchen L-Theanine 100 MG CAPS Take 2 tablets by mouth as needed.   Marland Kitchen levothyroxine (SYNTHROID, LEVOTHROID) 75 MCG tablet Take 75 mcg by mouth daily before breakfast.  . loratadine (CLARITIN) 10 MG tablet Take 10 mg by mouth daily.  Marland Kitchen  MAGNESIUM GLYCINATE PLUS PO Take 1 capsule by mouth daily.   . metoprolol succinate (TOPROL-XL) 50 MG 24 hr tablet TAKE 1 TABLET BY MOUTH EVERY DAY  . OMEGA-3 1000 MG CAPS Take 1,000 mg by mouth daily.    Glory Rosebush DELICA LANCETS 66Z MISC TEST TWICE DAILY  Dx: E11.9  . promethazine-dextromethorphan (PROMETHAZINE-DM) 6.25-15 MG/5ML syrup Take 5 mLs by mouth 4 (four) times daily as needed for cough.  . Pyridoxine HCl (VITAMIN B-6 PO) Take 1 tablet by mouth daily.  . rosuvastatin (CRESTOR) 10 MG tablet TAKE 1 TABLET BY MOUTH EVERY DAY FOR CHOLESTEROL  . Thiamine HCl (VITAMIN B-1) 250 MG tablet Take 250 mg by mouth daily.    . VENTOLIN HFA 108 (90 Base) MCG/ACT inhaler USE 2 PUFFS BY MOUTH EVERY 6 HOURS AS NEEDED FOR WHEEZING FOR  SHORTNESS OF BREATH  . vitamin B-12 (CYANOCOBALAMIN) 1000 MCG tablet Take 1,000 mcg by mouth daily.    Marland Kitchen losartan (COZAAR) 50 MG tablet TAKE 1 TABLET BY MOUTH EVERY DAY (Patient not taking: Reported on 02/08/2017)  . [DISCONTINUED] meloxicam (MOBIC) 15 MG tablet Take 1 tablet (15 mg total) by mouth daily. (Patient not taking: Reported on 02/08/2017)   No facility-administered encounter medications on file as of 02/08/2017.     Activities of Daily Living In your present state of health, do you have any difficulty performing the following activities: 02/08/2017  Hearing? N  Vision? N  Difficulty concentrating or making decisions? N  Walking or climbing stairs? N  Dressing or bathing? N  Doing errands, shopping? N  Preparing Food and eating ? N  Using the Toilet? N  In the past six months, have you accidently leaked urine? Y  Do you have problems with loss of bowel control? Y  Managing your Medications? N  Managing your Finances? N  Housekeeping or managing your Housekeeping? N  Some recent data might be hidden    Patient Care Team: Jinny Sanders, MD as PCP - General (Family Medicine) Jacelyn Pi, MD as Attending Physician (Endocrinology) Ernestine Conrad, MD as Referring Physician (Otolaryngology) Sanda Klein, MD as Consulting Physician (Cardiology) Jola Schmidt, MD as Consulting Physician (Ophthalmology) Levin Erp, DDS, PA as Referring Physician (Dentistry)    Assessment:    Physical assessment deferred to PCP.  Exercise Activities and Dietary recommendations Current Exercise Habits: Home exercise routine, Type of exercise: walking, Time (Minutes): 40, Frequency (Times/Week): 7, Weekly Exercise (Minutes/Week): 280  Goals    . Increase water intake (pt-stated)          Starting 02/08/2017, I will attempt to drink at least 6 glasses of water daily.    . Weight < 140 lb (63.5 kg) (pt-stated)          Goal weight: 130 lb      Fall Risk Fall Risk   02/08/2017 01/14/2016 08/15/2013  Falls in the past year? No No No   Depression Screen PHQ 2/9 Scores 02/08/2017 01/14/2016 11/06/2014 08/15/2013  PHQ - 2 Score 0 0 0 0     Cognitive Function PLEASE NOTE: A Mini-Cog screen was completed. Maximum score is 20. A value of 0 denotes this part of Folstein MMSE was not completed or the patient failed this part of the Mini-Cog screening.   Mini-Cog Screening Orientation to Time - Max 5 pts Orientation to Place - Max 5 pts Registration - Max 3 pts Recall - Max 3 pts Language Repeat - Max 1 pts Language Follow 3 Step Command -  Max 3 pts      Mini-Cog - 02/08/17 1051    Normal clock drawing test? no   How many words correct? 2      MMSE - Mini Mental State Exam 02/08/2017 01/14/2016  Orientation to time 5 5  Orientation to Place 5 5  Registration 3 3  Attention/ Calculation 0 0  Recall 2 3  Language- name 2 objects 0 0  Language- repeat 1 1  Language- follow 3 step command 3 3  Language- read & follow direction 0 0  Write a sentence 0 0  Copy design 0 0  Total score 19 20        Immunization History  Administered Date(s) Administered  . Influenza Split 05/15/2013  . Influenza-Unspecified 05/01/2014, 06/03/2015, 04/19/2016  . Pneumococcal Conjugate-13 08/15/2012  . Pneumococcal Polysaccharide-23 11/06/2014  . Tdap 06/03/2015  . Zoster 03/06/2013   Screening Tests Health Maintenance  Topic Date Due  . FOOT EXAM  01/21/2017  . INFLUENZA VACCINE  02/15/2017  . HEMOGLOBIN A1C  04/19/2017  . MAMMOGRAM  04/29/2017  . OPHTHALMOLOGY EXAM  04/29/2017  . COLONOSCOPY  03/16/2023  . TETANUS/TDAP  06/02/2025  . DEXA SCAN  Completed  . Hepatitis C Screening  Completed  . PNA vac Low Risk Adult  Completed      Plan:    Follow-up w/ PCP as scheduled.   I have personally reviewed and noted the following in the patient's chart:   . Medical and social history . Use of alcohol, tobacco or illicit drugs  . Current medications and  supplements . Functional ability and status . Nutritional status . Physical activity . Advanced directives . List of other physicians . Vitals . Screenings to include cognitive, depression, and falls . Referrals and appointments  In addition, I have reviewed and discussed with patient certain preventive protocols, quality metrics, and best practice recommendations. A written personalized care plan for preventive services as well as general preventive health recommendations were provided to patient.     Dorrene German, RN  02/08/2017

## 2017-02-08 ENCOUNTER — Ambulatory Visit (INDEPENDENT_AMBULATORY_CARE_PROVIDER_SITE_OTHER): Payer: PPO

## 2017-02-08 ENCOUNTER — Other Ambulatory Visit (INDEPENDENT_AMBULATORY_CARE_PROVIDER_SITE_OTHER): Payer: PPO

## 2017-02-08 VITALS — BP 158/82 | HR 78 | Resp 16 | Ht 62.5 in | Wt 146.2 lb

## 2017-02-08 DIAGNOSIS — E038 Other specified hypothyroidism: Secondary | ICD-10-CM

## 2017-02-08 DIAGNOSIS — D508 Other iron deficiency anemias: Secondary | ICD-10-CM | POA: Diagnosis not present

## 2017-02-08 DIAGNOSIS — Z0000 Encounter for general adult medical examination without abnormal findings: Secondary | ICD-10-CM

## 2017-02-08 DIAGNOSIS — E119 Type 2 diabetes mellitus without complications: Secondary | ICD-10-CM | POA: Diagnosis not present

## 2017-02-08 DIAGNOSIS — E7800 Pure hypercholesterolemia, unspecified: Secondary | ICD-10-CM

## 2017-02-08 LAB — COMPREHENSIVE METABOLIC PANEL
ALBUMIN: 4.7 g/dL (ref 3.5–5.2)
ALK PHOS: 51 U/L (ref 39–117)
ALT: 32 U/L (ref 0–35)
AST: 36 U/L (ref 0–37)
BUN: 13 mg/dL (ref 6–23)
CALCIUM: 9.8 mg/dL (ref 8.4–10.5)
CHLORIDE: 103 meq/L (ref 96–112)
CO2: 28 mEq/L (ref 19–32)
CREATININE: 0.89 mg/dL (ref 0.40–1.20)
GFR: 66.14 mL/min (ref >60.00)
Glucose, Bld: 105 mg/dL — ABNORMAL HIGH (ref 70–99)
POTASSIUM: 4.1 meq/L (ref 3.5–5.1)
Sodium: 137 mEq/L (ref 135–145)
TOTAL PROTEIN: 7.7 g/dL (ref 6.0–8.3)
Total Bilirubin: 0.6 mg/dL (ref 0.2–1.2)

## 2017-02-08 LAB — LIPID PANEL
CHOL/HDL RATIO: 3
CHOLESTEROL: 157 mg/dL (ref 0–200)
HDL: 47.5 mg/dL (ref >39.00)
NONHDL: 109.85
Triglycerides: 223 mg/dL — ABNORMAL HIGH (ref 0.0–149.0)
VLDL: 44.6 mg/dL — AB (ref 0.0–40.0)

## 2017-02-08 LAB — CBC WITH DIFFERENTIAL/PLATELET
BASOS PCT: 0.6 % (ref 0.0–3.0)
Basophils Absolute: 0 10*3/uL (ref 0.0–0.1)
EOS ABS: 0.1 10*3/uL (ref 0.0–0.7)
EOS PCT: 1.3 % (ref 0.0–5.0)
HEMATOCRIT: 39.6 % (ref 36.0–46.0)
HEMOGLOBIN: 13 g/dL (ref 12.0–15.0)
LYMPHS PCT: 31.9 % (ref 12.0–46.0)
Lymphs Abs: 2 10*3/uL (ref 0.7–4.0)
MCHC: 32.9 g/dL (ref 30.0–36.0)
MCV: 89.1 fl (ref 78.0–100.0)
MONOS PCT: 5.7 % (ref 3.0–12.0)
Monocytes Absolute: 0.4 10*3/uL (ref 0.1–1.0)
NEUTROS ABS: 3.8 10*3/uL (ref 1.4–7.7)
Neutrophils Relative %: 60.5 % (ref 43.0–77.0)
PLATELETS: 226 10*3/uL (ref 150.0–400.0)
RBC: 4.44 Mil/uL (ref 3.87–5.11)
RDW: 12.9 % (ref 11.5–15.5)
WBC: 6.4 10*3/uL (ref 4.0–10.5)

## 2017-02-08 LAB — TSH: TSH: 1.5 u[IU]/mL (ref 0.35–4.50)

## 2017-02-08 LAB — LDL CHOLESTEROL, DIRECT: LDL DIRECT: 87 mg/dL

## 2017-02-08 LAB — HEMOGLOBIN A1C: Hgb A1c MFr Bld: 6.4 % (ref 4.6–6.5)

## 2017-02-08 LAB — T4, FREE: FREE T4: 0.78 ng/dL (ref 0.60–1.60)

## 2017-02-08 LAB — T3, FREE: T3, Free: 2.9 pg/mL (ref 2.3–4.2)

## 2017-02-08 NOTE — Patient Instructions (Addendum)
Valerie Vargas , Thank you for taking time to come for your Medicare Wellness Visit. I appreciate your ongoing commitment to your health goals. Please review the following plan we discussed and let me know if I can assist you in the future.   Bring a copy of your advance directives to your next office visit.  These are the goals we discussed: Goals    . Increase water intake (pt-stated)          Starting 02/08/2017, I will attempt to drink at least 6 glasses of water daily.    . Weight < 140 lb (63.5 kg) (pt-stated)          Goal weight: 130 lb       This is a list of the screening recommended for you and due dates:  Health Maintenance  Topic Date Due  . Complete foot exam   01/21/2017  . Flu Shot  02/15/2017  . Hemoglobin A1C  04/19/2017  . Mammogram  04/29/2017  . Eye exam for diabetics  04/29/2017  . Colon Cancer Screening  03/16/2023  . Tetanus Vaccine  06/02/2025  . DEXA scan (bone density measurement)  Completed  .  Hepatitis C: One time screening is recommended by Center for Disease Control  (CDC) for  adults born from 18 through 1967.   Completed  . Pneumonia vaccines  Completed   Preventive Care for Adults  A healthy lifestyle and preventive care can promote health and wellness. Preventive health guidelines for adults include the following key practices.  . A routine yearly physical is a good way to check with your health care provider about your health and preventive screening. It is a chance to share any concerns and updates on your health and to receive a thorough exam.  . Visit your dentist for a routine exam and preventive care every 6 months. Brush your teeth twice a day and floss once a day. Good oral hygiene prevents tooth decay and gum disease.  . The frequency of eye exams is based on your age, health, family medical history, use  of contact lenses, and other factors. Follow your health care provider's ecommendations for frequency of eye exams.  . Eat a  healthy diet. Foods like vegetables, fruits, whole grains, low-fat dairy products, and lean protein foods contain the nutrients you need without too many calories. Decrease your intake of foods high in solid fats, added sugars, and salt. Eat the right amount of calories for you. Get information about a proper diet from your health care provider, if necessary.  . Regular physical exercise is one of the most important things you can do for your health. Most adults should get at least 150 minutes of moderate-intensity exercise (any activity that increases your heart rate and causes you to sweat) each week. In addition, most adults need muscle-strengthening exercises on 2 or more days a week.  Silver Sneakers may be a benefit available to you. To determine eligibility, you may visit the website: www.silversneakers.com or contact program at 954-309-1611 Mon-Fri between 8AM-8PM.   . Maintain a healthy weight. The body mass index (BMI) is a screening tool to identify possible weight problems. It provides an estimate of body fat based on height and weight. Your health care provider can find your BMI and can help you achieve or maintain a healthy weight.   For adults 20 years and older: ? A BMI below 18.5 is considered underweight. ? A BMI of 18.5 to 24.9 is normal. ? A  BMI of 25 to 29.9 is considered overweight. ? A BMI of 30 and above is considered obese.   . Maintain normal blood lipids and cholesterol levels by exercising and minimizing your intake of saturated fat. Eat a balanced diet with plenty of fruit and vegetables. Blood tests for lipids and cholesterol should begin at age 75 and be repeated every 5 years. If your lipid or cholesterol levels are high, you are over 50, or you are at high risk for heart disease, you may need your cholesterol levels checked more frequently. Ongoing high lipid and cholesterol levels should be treated with medicines if diet and exercise are not working.  . If you  smoke, find out from your health care provider how to quit. If you do not use tobacco, please do not start.  . If you choose to drink alcohol, please do not consume more than 2 drinks per day. One drink is considered to be 12 ounces (355 mL) of beer, 5 ounces (148 mL) of wine, or 1.5 ounces (44 mL) of liquor.  . If you are 10-68 years old, ask your health care provider if you should take aspirin to prevent strokes.  . Use sunscreen. Apply sunscreen liberally and repeatedly throughout the day. You should seek shade when your shadow is shorter than you. Protect yourself by wearing long sleeves, pants, a wide-brimmed hat, and sunglasses year round, whenever you are outdoors.  . Once a month, do a whole body skin exam, using a mirror to look at the skin on your back. Tell your health care provider of new moles, moles that have irregular borders, moles that are larger than a pencil eraser, or moles that have changed in shape or color.

## 2017-02-08 NOTE — Progress Notes (Signed)
I reviewed health advisor's note, was available for consultation, and agree with documentation and plan.  

## 2017-02-14 ENCOUNTER — Encounter: Payer: Self-pay | Admitting: Family Medicine

## 2017-02-14 ENCOUNTER — Other Ambulatory Visit: Payer: Self-pay | Admitting: Family Medicine

## 2017-02-14 ENCOUNTER — Ambulatory Visit (INDEPENDENT_AMBULATORY_CARE_PROVIDER_SITE_OTHER): Payer: PPO | Admitting: Family Medicine

## 2017-02-14 VITALS — BP 130/64 | HR 72 | Temp 98.2°F | Ht 62.5 in | Wt 148.0 lb

## 2017-02-14 DIAGNOSIS — E119 Type 2 diabetes mellitus without complications: Secondary | ICD-10-CM

## 2017-02-14 DIAGNOSIS — E7800 Pure hypercholesterolemia, unspecified: Secondary | ICD-10-CM | POA: Diagnosis not present

## 2017-02-14 DIAGNOSIS — Z0000 Encounter for general adult medical examination without abnormal findings: Secondary | ICD-10-CM

## 2017-02-14 DIAGNOSIS — M79672 Pain in left foot: Secondary | ICD-10-CM | POA: Diagnosis not present

## 2017-02-14 DIAGNOSIS — Z1231 Encounter for screening mammogram for malignant neoplasm of breast: Secondary | ICD-10-CM | POA: Diagnosis not present

## 2017-02-14 DIAGNOSIS — E038 Other specified hypothyroidism: Secondary | ICD-10-CM

## 2017-02-14 DIAGNOSIS — I10 Essential (primary) hypertension: Secondary | ICD-10-CM | POA: Diagnosis not present

## 2017-02-14 DIAGNOSIS — E2839 Other primary ovarian failure: Secondary | ICD-10-CM | POA: Diagnosis not present

## 2017-02-14 DIAGNOSIS — Z0001 Encounter for general adult medical examination with abnormal findings: Secondary | ICD-10-CM | POA: Diagnosis not present

## 2017-02-14 LAB — HM DIABETES FOOT EXAM

## 2017-02-14 NOTE — Assessment & Plan Note (Signed)
Was over-treated, but may be running high off of ACEI. Follow at home. May need to start back on 1/2 tabs of losartan.  If remains off will need  mircroalbumin test.

## 2017-02-14 NOTE — Progress Notes (Signed)
Subjective:    Patient ID: Valerie Vargas, female    DOB: [birth year], 71 y.o.   MRN: [id number]  HPI  The patient presents for  complete physical and review of chronic health problems. He/She also has the following acute concerns today: left foot heel pain. Right carpal tunnel.   The patient saw Candis Musa, LPN for medicare wellness. Note reviewed in detail and important notes copied below.  Health maintenance: Foot exam-due. DEXA- due. Abnormal screenings:  Cognitive-abnormal clock drawing, 2/3 recall  Patient concerns:  1) She stopped taking losartan because home BPs were running 90s/50s.  2) ? L foot plantar fasciitis  3) She would like to have carpal tunnel release surgery.  Wearing brace at night.  Mainly having issues in right wrist numbness when driving.  TODAY 02/14/17  Hypertension:    Well controlled today on  metoprolol.  She is still off losartan given dizziness and low BPs. .. Has not been checking lately... Dizziness has now resolved. BP Readings from Last 3 Encounters:  02/14/17 130/64  02/08/17 (!) 158/82  10/27/16 138/88  Using medication without problems or lightheadedness:  Yes, see above Chest pain with exertion: none Edema:none Short of breath: none Average home BPs: low Other issues:  Hypothyroidism : Stable control on levothyroxine 75 mg Lab Results  Component Value Date   TSH 1.50 02/08/2017   Elevated Cholesterol:  LDL at goal on crestor 10 mg Lab Results  Component Value Date   CHOL 157 02/08/2017   HDL 47.50 02/08/2017   LDLCALC 71 10/30/2014   LDLDIRECT 87.0 02/08/2017   TRIG 223.0 (H) 02/08/2017   CHOLHDL 3 02/08/2017  Using medications without problems: none Muscle aches:  none Diet compliance: good Exercise: walking Other complaints:  Diabetes:   Good control  with diet Lab Results  Component Value Date   HGBA1C 6.4 02/08/2017  Using medications without difficulties: Hypoglycemic episodes:  none Hyperglycemic episodes: none Feet problems: none Blood Sugars averaging: FBS148 eye exam within last year:04/2016  Left heel pain.. Soreness. Most in AM, better with walking. Doing stretches. Wearing shoe inserts.  Hx of plantar fasciitis.  Social History /Family History/Past Medical History reviewed in detail and updated in EMR if needed. Blood pressure 130/64, pulse 72, temperature 98.2 F (36.8 C), temperature source Oral, height 5' 2.5" (1.588 m), weight 148 lb (67.1 kg).   Review of Systems  Constitutional: Negative for fatigue and fever.  HENT: Negative for ear pain.   Eyes: Negative for pain.  Respiratory: Negative for chest tightness and shortness of breath.   Cardiovascular: Negative for chest pain, palpitations and leg swelling.  Gastrointestinal: Negative for abdominal pain.  Genitourinary: Negative for dysuria.       Objective:   Physical Exam  Constitutional: Vital signs are normal. She appears well-developed and well-nourished. She is cooperative.  Non-toxic appearance. She does not appear ill. No distress.  HENT:  Head: Normocephalic.  Right Ear: Hearing, tympanic membrane, external ear and ear canal normal.  Left Ear: Hearing, tympanic membrane, external ear and ear canal normal.  Nose: Nose normal.  Eyes: Pupils are equal, round, and reactive to light. Conjunctivae, EOM and lids are normal. Lids are everted and swept, no foreign bodies found.  Neck: Trachea normal and normal range of motion. Neck supple. Carotid bruit is not present. No thyroid mass and no thyromegaly present.  Cardiovascular: Normal rate, regular rhythm, S1 normal, S2 normal, normal heart sounds and intact distal pulses.  Exam reveals no gallop.  No murmur heard. Pulmonary/Chest: Effort normal and breath sounds normal. No respiratory distress. She has no wheezes. She has no rhonchi. She has no rales.  Abdominal: Soft. Normal appearance and bowel sounds are normal. She exhibits no  distension, no fluid wave, no abdominal bruit and no mass. There is no hepatosplenomegaly. There is no tenderness. There is no rebound, no guarding and no CVA tenderness. No hernia.  Lymphadenopathy:    She has no cervical adenopathy.    She has no axillary adenopathy.  Neurological: She is alert. She has normal strength. No cranial nerve deficit or sensory deficit.  Skin: Skin is warm, dry and intact. No rash noted.  Psychiatric: Her speech is normal and behavior is normal. Judgment normal. Her mood appears not anxious. Cognition and memory are normal. She does not exhibit a depressed mood.    Multiple SKs and one irritated at bra lin on left back, hemangioma in groin     Diabetic foot exam: Normal inspection No skin breakdown No calluses  Normal DP pulses Normal sensation to light touch and monofilament Nails normal TTP over left insertion of plantar fascia  Assessment & Plan:  The patient's preventative maintenance and recommended screening tests for an annual wellness exam were reviewed in full today. Brought up to date unless services declined.  Counselled on the importance of diet, exercise, and its role in overall health and mortality. The patient's FH and SH was reviewed, including their home life, tobacco status, and drug and alcohol status.   Colon: 03/15/2013 nml.. Due for 10 year repeat in 2024. Nonsmoker DEXA: Normal 09/2011, can repeat in 5 years.  DUE  Mammogram last 04/2016 PAP not indicated, DVE not indicated total hysterecotmy Vaccine: uptodate  Hep C neg

## 2017-02-14 NOTE — Patient Instructions (Addendum)
Follow BP at home.  If BP is greater than 140/90 consistently.. Consider adding back losartan at 1/2tab daily. Please stop at the front desk to set up referral.

## 2017-02-14 NOTE — Assessment & Plan Note (Signed)
Adequate control on crestor.

## 2017-02-14 NOTE — Assessment & Plan Note (Signed)
Well controlled. Continue current medication.  

## 2017-02-14 NOTE — Assessment & Plan Note (Signed)
Good control with diet. Encouraged exercise, weight loss, healthy eating habits.  

## 2017-02-14 NOTE — Assessment & Plan Note (Signed)
Most consistent with plantar fasciitis. Treat with NSAIDS short-term, home PT and ice massage.

## 2017-02-15 DIAGNOSIS — R7989 Other specified abnormal findings of blood chemistry: Secondary | ICD-10-CM | POA: Diagnosis not present

## 2017-02-15 DIAGNOSIS — E1165 Type 2 diabetes mellitus with hyperglycemia: Secondary | ICD-10-CM | POA: Diagnosis not present

## 2017-02-15 DIAGNOSIS — E039 Hypothyroidism, unspecified: Secondary | ICD-10-CM | POA: Diagnosis not present

## 2017-02-15 DIAGNOSIS — E7800 Pure hypercholesterolemia, unspecified: Secondary | ICD-10-CM | POA: Diagnosis not present

## 2017-02-22 DIAGNOSIS — I10 Essential (primary) hypertension: Secondary | ICD-10-CM | POA: Diagnosis not present

## 2017-02-22 DIAGNOSIS — E039 Hypothyroidism, unspecified: Secondary | ICD-10-CM | POA: Diagnosis not present

## 2017-02-22 DIAGNOSIS — E1165 Type 2 diabetes mellitus with hyperglycemia: Secondary | ICD-10-CM | POA: Diagnosis not present

## 2017-02-22 DIAGNOSIS — R7989 Other specified abnormal findings of blood chemistry: Secondary | ICD-10-CM | POA: Diagnosis not present

## 2017-02-22 DIAGNOSIS — E7800 Pure hypercholesterolemia, unspecified: Secondary | ICD-10-CM | POA: Diagnosis not present

## 2017-03-27 ENCOUNTER — Other Ambulatory Visit: Payer: Self-pay | Admitting: Cardiovascular Disease

## 2017-05-01 ENCOUNTER — Ambulatory Visit
Admission: RE | Admit: 2017-05-01 | Discharge: 2017-05-01 | Disposition: A | Discharge status: Home / Self Care | Payer: PPO | Source: Ambulatory Visit | Attending: Family Medicine | Admitting: Family Medicine

## 2017-05-01 DIAGNOSIS — Z1231 Encounter for screening mammogram for malignant neoplasm of breast: Secondary | ICD-10-CM

## 2017-05-01 DIAGNOSIS — Z780 Asymptomatic menopausal state: Secondary | ICD-10-CM | POA: Diagnosis not present

## 2017-05-01 DIAGNOSIS — E2839 Other primary ovarian failure: Secondary | ICD-10-CM

## 2017-05-05 DIAGNOSIS — H5203 Hypermetropia, bilateral: Secondary | ICD-10-CM | POA: Diagnosis not present

## 2017-05-05 DIAGNOSIS — H2511 Age-related nuclear cataract, right eye: Secondary | ICD-10-CM | POA: Diagnosis not present

## 2017-05-05 DIAGNOSIS — E119 Type 2 diabetes mellitus without complications: Secondary | ICD-10-CM | POA: Diagnosis not present

## 2017-05-05 LAB — HM DIABETES EYE EXAM

## 2017-05-09 ENCOUNTER — Encounter: Payer: Self-pay | Admitting: Family Medicine

## 2017-07-22 ENCOUNTER — Other Ambulatory Visit: Payer: Self-pay | Admitting: Family Medicine

## 2017-07-24 NOTE — Telephone Encounter (Signed)
Last office visit 02/14/2017.  Last refilled 06/02/2016 for #60 with 1 refill.  Ok to refill?

## 2017-07-25 ENCOUNTER — Telehealth: Payer: Self-pay | Admitting: Family Medicine

## 2017-07-25 NOTE — Telephone Encounter (Signed)
See 07/22/17 refill request.

## 2017-07-25 NOTE — Telephone Encounter (Signed)
Copied from [place] [phone]. Topic: Quick Communication - See Telephone Encounter >> Jul 25, 2017 10:42 AM Ahmed Prima L wrote: CRM for notification. See Telephone encounter for:   07/25/17.  benzonatate (TESSALON) 100 MG capsule  [place], [place] - [address]

## 2017-08-21 DIAGNOSIS — E7800 Pure hypercholesterolemia, unspecified: Secondary | ICD-10-CM | POA: Diagnosis not present

## 2017-08-21 DIAGNOSIS — E039 Hypothyroidism, unspecified: Secondary | ICD-10-CM | POA: Diagnosis not present

## 2017-08-21 DIAGNOSIS — E1165 Type 2 diabetes mellitus with hyperglycemia: Secondary | ICD-10-CM | POA: Diagnosis not present

## 2017-08-28 DIAGNOSIS — E1165 Type 2 diabetes mellitus with hyperglycemia: Secondary | ICD-10-CM | POA: Diagnosis not present

## 2017-08-28 DIAGNOSIS — R7989 Other specified abnormal findings of blood chemistry: Secondary | ICD-10-CM | POA: Diagnosis not present

## 2017-08-28 DIAGNOSIS — E7800 Pure hypercholesterolemia, unspecified: Secondary | ICD-10-CM | POA: Diagnosis not present

## 2017-08-28 DIAGNOSIS — E039 Hypothyroidism, unspecified: Secondary | ICD-10-CM | POA: Diagnosis not present

## 2017-08-28 DIAGNOSIS — I10 Essential (primary) hypertension: Secondary | ICD-10-CM | POA: Diagnosis not present

## 2017-09-04 ENCOUNTER — Other Ambulatory Visit: Payer: Self-pay | Admitting: Family Medicine

## 2017-10-18 DIAGNOSIS — R1310 Dysphagia, unspecified: Secondary | ICD-10-CM | POA: Diagnosis not present

## 2017-10-18 DIAGNOSIS — J385 Laryngeal spasm: Secondary | ICD-10-CM | POA: Diagnosis not present

## 2017-10-18 DIAGNOSIS — R490 Dysphonia: Secondary | ICD-10-CM | POA: Diagnosis not present

## 2017-10-18 DIAGNOSIS — J387 Other diseases of larynx: Secondary | ICD-10-CM | POA: Diagnosis not present

## 2017-10-18 DIAGNOSIS — R05 Cough: Secondary | ICD-10-CM | POA: Diagnosis not present

## 2017-11-01 ENCOUNTER — Telehealth: Payer: Self-pay | Admitting: Family Medicine

## 2017-11-01 NOTE — Telephone Encounter (Signed)
Copied from [place] [id number]. Topic: Quick Communication - See Telephone Encounter >> Nov 01, 2017 11:53 AM Boyd Kerbs wrote: CRM for notification.   Pt. Goes to Endo Dr. Suzette Battiest is needing Metrobolic panel, B2W, [place] Panel, TSH  She is having labs on 7/26 If labs are not covered under what Dr. Diona Browner does for physical labs, if she can have all done instead of going again for more labs.  If so can send to Dr. Suzette Battiest results  [facility] to leave message on machine if she is not there.   See Telephone encounter for: 11/01/17.

## 2017-11-02 NOTE — Telephone Encounter (Signed)
Ms. Zara notified by telephone that we can do the labs for her when she comes in the her [place] with Lattie Haw.

## 2017-11-02 NOTE — Telephone Encounter (Signed)
We can do those labs

## 2017-11-27 ENCOUNTER — Other Ambulatory Visit: Payer: Self-pay | Admitting: Family Medicine

## 2017-11-27 NOTE — Telephone Encounter (Signed)
Last office visit 02/14/2017.  Last refilled 06/23/2015 for #10 with no refills.  Ok to refill?

## 2018-02-01 ENCOUNTER — Other Ambulatory Visit: Payer: Self-pay | Admitting: Cardiovascular Disease

## 2018-02-01 NOTE — Telephone Encounter (Signed)
Rx sent to pharmacy   

## 2018-02-04 NOTE — Progress Notes (Addendum)
Patient ID: Valerie Vargas, female   DOB: [birth year], 72 y.o.   MRN: [id number]    Cardiology Office Note    Date:  02/06/2018   ID:  Ezinne, Yogi Jan 29, 1946, MRN [id number]  PCP:  Jinny Sanders, MD  Cardiologist:   Sanda Klein, MD   No chief complaint on file.   History of Present Illness:  Valerie Vargas is a 72 y.o. female with a remote history of syncope in 2007 and negative subsequent workup, minor coronary artery disease by angiography in 2003, hypertension, hyperlipidemia, type 2 diabetes mellitus .  She is done well since her last appointment.  She has been focusing more on her own health and has managed to lose substantial weight.  Her BMI is now 25.  She has not been using her CPAP recently.  She denies any daytime hypersomnolence.  She wonders whether she really still needs that device.  She tells me that her indication for CPAP was borderline to begin with (2005 polysomnogram with mild obstructive sleep apnea with oxygen desaturation down to 87%).  She sleeps well and denies daytime hypersomnolence.  The patient specifically denies any chest pain at rest exertion, dyspnea at rest or with exertion, orthopnea, paroxysmal nocturnal dyspnea, syncope, palpitations, focal neurological deficits, intermittent claudication, lower extremity edema, unexplained weight gain, cough, hemoptysis or wheezing.  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  In the past a tilt table test demonstrated that she had orthostatic hypotension. Normal Vascuscreen (minimal carotid plaque, normal ABI, no AAA) in May 2018.  Last hemoglobin A1c was in the desirable range at 6.6% on a very low dose of metformin.  The lipid profile is excellent with an LDL of 56 and HDL of 53   Past Medical History:  Diagnosis Date  . Allergy, unspecified not elsewhere classified   . Chronic interstitial cystitis   . Cough   . Esophageal reflux   . H/O syncope 2007    No recurrence;Tilt table 09/23/05-negative  . Hyperlipidemia 08/22/2014  . Hypertension   . Hypertriglyceridemia   . Irritable bowel syndrome   . Nonocclusive coronary atherosclerosis of native coronary artery   . Obstructive sleep apnea (adult) (pediatric)   . Paroxysmal ventricular tachycardia ([place])   . Type II or unspecified type diabetes mellitus without mention of complication, not stated as uncontrolled   . Unspecified asthma(493.90)   . Unspecified hypothyroidism   . Ventricular tachycardia [facility])     Past Surgical History:  Procedure Laterality Date  . ABDOMINAL HYSTERECTOMY  11/1979   for endometriosis  . CHOLECYSTECTOMY  07/2000  . ESOPHAGOGASTRODUODENOSCOPY ENDOSCOPY  2010    Outpatient Medications Prior to Visit  Medication Sig Dispense Refill  . ALPRAZolam (XANAX) 0.5 MG tablet TAKE 1 TABLET BY MOUTH EVERY DAY AS NEEDED 10 tablet 0  . Ascorbic Acid (VITAMIN C) 1000 MG tablet Take 1,000 mg by mouth daily.      Marland Kitchen aspirin 81 MG tablet Take 81 mg by mouth daily.      . benzonatate (TESSALON) 100 MG capsule TAKE TWO CAPSULES THREE TIMES DAILY AS NEEDED FOR COUGH 60 capsule 1  . Cholecalciferol (VITAMIN D3) 1000 UNITS CAPS Take 1 capsule by mouth daily.      . Diphenhydramine-PE-APAP (BENADRYL ALLERGY/SINUS HEADACH) 12.5-5-325 MG TABS Take 0.5 tablets by mouth as needed.     . gabapentin (NEURONTIN) 100 MG tablet Take 300 mg by mouth 3 (three) times daily.      Marland Kitchen  glucose blood (ONE TOUCH ULTRA TEST) test strip -TEST TWICE DAILY 100 each 6  . Guaifenesin 1200 MG TB12 Take by mouth as needed.     Marland Kitchen L-Theanine 100 MG CAPS Take 2 tablets by mouth as needed.     Marland Kitchen levothyroxine (SYNTHROID, LEVOTHROID) 75 MCG tablet Take 75 mcg by mouth daily before breakfast.    . loratadine (CLARITIN) 10 MG tablet Take 10 mg by mouth daily.    Marland Kitchen MAGNESIUM GLYCINATE PLUS PO Take 1 capsule by mouth daily.     . metFORMIN (GLUCOPHAGE-XR) 500 MG 24 hr tablet     . metoprolol succinate (TOPROL-XL) 50  MG 24 hr tablet TAKE 1 TABLET BY MOUTH EVERY DAY (TAKE WITH OR IMMEDIATELY FOLLOWING A MEAL) 30 tablet 8  . metoprolol succinate (TOPROL-XL) 50 MG 24 hr tablet Take 1 tablet (50 mg total) by mouth daily. Keep OV 30 tablet 0  . OMEGA-3 1000 MG CAPS Take 1,000 mg by mouth daily.      Glory Rosebush DELICA LANCETS 41L MISC TEST TWICE DAILY  Dx: E11.9 100 each 6  . promethazine-dextromethorphan (PROMETHAZINE-DM) 6.25-15 MG/5ML syrup Take 5 mLs by mouth 4 (four) times daily as needed for cough. 180 mL 0  . Pyridoxine HCl (VITAMIN B-6 PO) Take 1 tablet by mouth daily.    . rosuvastatin (CRESTOR) 10 MG tablet TAKE 1 TABLET BY MOUTH EVERY DAY FOR CHOLESTEROL 90 tablet 0  . Thiamine HCl (VITAMIN B-1) 250 MG tablet Take 250 mg by mouth daily.      . VENTOLIN HFA 108 (90 Base) MCG/ACT inhaler USE 2 PUFFS BY MOUTH EVERY 6 HOURS AS NEEDED FOR WHEEZING FOR SHORTNESS OF BREATH 18 g 2  . vitamin B-12 (CYANOCOBALAMIN) 1000 MCG tablet Take 1,000 mcg by mouth daily.      . Coenzyme Q10 (CO Q 10) 100 MG CAPS Take 400 mg by mouth daily.       No facility-administered medications prior to visit.      Allergies:   Codeine; Hctz [hydrochlorothiazide]; Penicillins; and Sulfa antibiotics   Social History   Socioeconomic History  . Marital status: Married    Spouse name: Not on file  . Number of children: Not on file  . Years of education: Not on file  . Highest education level: Not on file  Occupational History  . Not on file  Social Needs  . Financial resource strain: Not on file  . Food insecurity:    Worry: Not on file    Inability: Not on file  . Transportation needs:    Medical: Not on file    Non-medical: Not on file  Tobacco Use  . Smoking status: Never Smoker  . Smokeless tobacco: Never Used  Substance and Sexual Activity  . Alcohol use: No  . Drug use: No  . Sexual activity: Never  Lifestyle  . Physical activity:    Days per week: Not on file    Minutes per session: Not on file  . Stress:  Not on file  Relationships  . Social connections:    Talks on phone: Not on file    Gets together: Not on file    Attends religious service: Not on file    Active member of club or organization: Not on file    Attends meetings of clubs or organizations: Not on file    Relationship status: Not on file  Other Topics Concern  . Not on file  Social History Narrative  . Not on  file     Family History:  The patient's family history includes Arthritis in her mother; Breast cancer in her maternal aunt, mother, and paternal aunt; Diabetes in her father and mother; Heart disease in her father; Leukemia in her brother; Stomach cancer in her father.   ROS:   Please see the history of present illness.    ROS All other systems reviewed and are negative.   PHYSICAL EXAM:   VS:  BP 140/76   Pulse 84   Ht 5' 2.5" (1.588 m)   Wt 141 lb (64 kg)   BMI 25.38 kg/m     General: Alert, oriented x3, no distress, appears lean and fit Head: no evidence of trauma, PERRL, EOMI, no exophtalmos or lid lag, no myxedema, no xanthelasma; normal ears, nose and oropharynx Neck: normal jugular venous pulsations and no hepatojugular reflux; brisk carotid pulses without delay and no carotid bruits Chest: clear to auscultation, no signs of consolidation by percussion or palpation, normal fremitus, symmetrical and full respiratory excursions Cardiovascular: normal position and quality of the apical impulse, regular rhythm, normal first and second heart sounds, no murmurs, rubs or gallops Abdomen: no tenderness or distention, no masses by palpation, no abnormal pulsatility or arterial bruits, normal bowel sounds, no hepatosplenomegaly Extremities: no clubbing, cyanosis or edema; 2+ radial, ulnar and brachial pulses bilaterally; 2+ right femoral, posterior tibial and dorsalis pedis pulses; 2+ left femoral, posterior tibial and dorsalis pedis pulses; no subclavian or femoral bruits Neurological: grossly nonfocal Psych:  Normal mood and affect   Wt Readings from Last 3 Encounters:  02/05/18 141 lb (64 kg)  02/14/17 148 lb (67.1 kg)  02/08/17 146 lb 4 oz (66.3 kg)      Studies/Labs Reviewed:   EKG:  EKG is ordered today.  The ekg ordered today demonstrates normal sinus rhythm, tracing, QTC 449 ms  Recent Labs: 02/08/2017: ALT 32; BUN 13; Creatinine, Ser 0.89; Hemoglobin 13.0; Platelets 226.0; Potassium 4.1; Sodium 137; TSH 1.50   Lipid Panel    Component Value Date/Time   CHOL 157 02/08/2017 1118   TRIG 223.0 (H) 02/08/2017 1118   HDL 47.50 02/08/2017 1118   CHOLHDL 3 02/08/2017 1118   VLDL 44.6 (H) 02/08/2017 1118   LDLCALC 71 10/30/2014 0925   LDLDIRECT 87.0 02/08/2017 1118     More recent labs from primary care physician show: Hemoglobin A1c 6. 6 %, creatinine 0. 8, normal liver function tests, potassium 4.4 Total cholesterol 135, TG 129, HDL 53, LDL 54  ASSESSMENT:    1. Essential hypertension   2. High cholesterol   3. Diabetes mellitus with no complication ([place])   4. OBSTRUCTIVE SLEEP APNEA      PLAN:  In order of problems listed above:  1. HTN: Blood pressure was slightly high today, but rechecked it was 127/60.  At home her typical blood pressure is in the 125/60 range.  No changes made to her medication 2. HLP: Excellent lipid profile.  Triglycerides are now normal range and her HDL is excellent.  LDL at target. 3. DM: Good glycemic control on metformin monotherapy. 4. OSA: Her indication for CPAP was marginal to begin with and she has lost a lot of weight.  I agree that she may no longer require this treatment.  We will schedule nocturnal oximetry.  If the overnight oximetry study shows significant desaturation, I would repeat a complete sleep study.     Medication Adjustments/Labs and Tests Ordered: Current medicines are reviewed at length with the patient  today.  Concerns regarding medicines are outlined above.  Medication changes, Labs and Tests ordered today are  listed in the Patient Instructions below. Patient Instructions  Medication Instructions: Dr Sallyanne Kuster recommends that you continue on your current medications as directed. Please refer to the Current Medication list given to you today.  Labwork: NONE ORDERED  Testing/Procedures: 1. Overnight Pulse Oximetry - Your physician has ordered overnight pulse oximetry. This will be ordered through your DME company.  Follow-up: Dr Sallyanne Kuster recommends that you schedule a follow-up appointment in 12 months. You will receive a reminder letter in the mail two months in advance. If you don't receive a letter, please call our office to schedule the follow-up appointment.  If you need a refill on your cardiac medications before your next appointment, please call your pharmacy.      Signed, Sanda Klein, MD  02/06/2018 11:49 AM    [facility] [address], [place], [place]  [address] Phone: [phone]; Fax: [phone]

## 2018-02-05 ENCOUNTER — Ambulatory Visit: Payer: PPO | Admitting: Cardiovascular Disease

## 2018-02-05 ENCOUNTER — Encounter: Payer: Self-pay | Admitting: Cardiovascular Disease

## 2018-02-05 VITALS — BP 140/76 | HR 84 | Ht 62.5 in | Wt 141.0 lb

## 2018-02-05 DIAGNOSIS — E119 Type 2 diabetes mellitus without complications: Secondary | ICD-10-CM

## 2018-02-05 DIAGNOSIS — E7800 Pure hypercholesterolemia, unspecified: Secondary | ICD-10-CM | POA: Diagnosis not present

## 2018-02-05 DIAGNOSIS — G4733 Obstructive sleep apnea (adult) (pediatric): Secondary | ICD-10-CM | POA: Diagnosis not present

## 2018-02-05 DIAGNOSIS — I10 Essential (primary) hypertension: Secondary | ICD-10-CM | POA: Diagnosis not present

## 2018-02-05 NOTE — Patient Instructions (Addendum)
Medication Instructions: Dr Sallyanne Kuster recommends that you continue on your current medications as directed. Please refer to the Current Medication list given to you today.  Labwork: NONE ORDERED  Testing/Procedures: 1. Overnight Pulse Oximetry - Your physician has ordered overnight pulse oximetry. This will be ordered through your DME company.  Follow-up: Dr Sallyanne Kuster recommends that you schedule a follow-up appointment in 12 months. You will receive a reminder letter in the mail two months in advance. If you don't receive a letter, please call our office to schedule the follow-up appointment.  If you need a refill on your cardiac medications before your next appointment, please call your pharmacy.

## 2018-02-08 ENCOUNTER — Telehealth: Payer: Self-pay | Admitting: Family Medicine

## 2018-02-08 DIAGNOSIS — D508 Other iron deficiency anemias: Secondary | ICD-10-CM

## 2018-02-08 DIAGNOSIS — E038 Other specified hypothyroidism: Secondary | ICD-10-CM

## 2018-02-08 DIAGNOSIS — E119 Type 2 diabetes mellitus without complications: Secondary | ICD-10-CM

## 2018-02-08 NOTE — Telephone Encounter (Signed)
-----   Message from Eustace Pen, LPN sent at 09/15/4067  4:10 PM EDT ----- Regarding: Labs 7/26 Lab orders needed. Thank you.  Insurance:  [employer]

## 2018-02-08 NOTE — Telephone Encounter (Signed)
e

## 2018-02-08 NOTE — Addendum Note (Signed)
Addended by: Ellamae Sia on: 02/08/2018 11:31 AM   Modules accepted: Orders

## 2018-02-09 ENCOUNTER — Ambulatory Visit: Payer: PPO

## 2018-02-09 ENCOUNTER — Ambulatory Visit (INDEPENDENT_AMBULATORY_CARE_PROVIDER_SITE_OTHER): Payer: PPO

## 2018-02-09 VITALS — BP 128/74 | HR 71 | Temp 98.1°F | Ht 62.5 in | Wt 139.5 lb

## 2018-02-09 DIAGNOSIS — Z0000 Encounter for general adult medical examination without abnormal findings: Secondary | ICD-10-CM

## 2018-02-09 DIAGNOSIS — D508 Other iron deficiency anemias: Secondary | ICD-10-CM

## 2018-02-09 DIAGNOSIS — E038 Other specified hypothyroidism: Secondary | ICD-10-CM

## 2018-02-09 DIAGNOSIS — E119 Type 2 diabetes mellitus without complications: Secondary | ICD-10-CM

## 2018-02-09 LAB — COMPREHENSIVE METABOLIC PANEL
ALBUMIN: 4.8 g/dL (ref 3.5–5.2)
ALT: 16 U/L (ref 0–35)
AST: 21 U/L (ref 0–37)
Alkaline Phosphatase: 45 U/L (ref 39–117)
BUN: 20 mg/dL (ref 6–23)
CHLORIDE: 104 meq/L (ref 96–112)
CO2: 26 mEq/L (ref 19–32)
Calcium: 9.9 mg/dL (ref 8.4–10.5)
Creatinine, Ser: 0.9 mg/dL (ref 0.40–1.20)
GFR: 65.11 mL/min (ref >60.00)
Glucose, Bld: 111 mg/dL — ABNORMAL HIGH (ref 70–99)
POTASSIUM: 4.9 meq/L (ref 3.5–5.1)
SODIUM: 140 meq/L (ref 135–145)
Total Bilirubin: 0.5 mg/dL (ref 0.2–1.2)
Total Protein: 7.8 g/dL (ref 6.0–8.3)

## 2018-02-09 LAB — HEMOGLOBIN A1C: HEMOGLOBIN A1C: 6.2 % (ref 4.6–6.5)

## 2018-02-09 LAB — LIPID PANEL
CHOL/HDL RATIO: 3
CHOLESTEROL: 146 mg/dL (ref 0–200)
HDL: 50.8 mg/dL (ref >39.00)
NonHDL: 95.15
Triglycerides: 238 mg/dL — ABNORMAL HIGH (ref 0.0–149.0)
VLDL: 47.6 mg/dL — AB (ref 0.0–40.0)

## 2018-02-09 LAB — CBC WITH DIFFERENTIAL/PLATELET
BASOS ABS: 0 10*3/uL (ref 0.0–0.1)
Basophils Relative: 0.3 % (ref 0.0–3.0)
EOS ABS: 0.1 10*3/uL (ref 0.0–0.7)
Eosinophils Relative: 1.2 % (ref 0.0–5.0)
HCT: 38.7 % (ref 36.0–46.0)
Hemoglobin: 13.1 g/dL (ref 12.0–15.0)
Lymphocytes Relative: 32 % (ref 12.0–46.0)
Lymphs Abs: 2.4 10*3/uL (ref 0.7–4.0)
MCHC: 33.7 g/dL (ref 30.0–36.0)
MCV: 88.8 fl (ref 78.0–100.0)
MONO ABS: 0.4 10*3/uL (ref 0.1–1.0)
Monocytes Relative: 5.4 % (ref 3.0–12.0)
NEUTROS PCT: 61.1 % (ref 43.0–77.0)
Neutro Abs: 4.5 10*3/uL (ref 1.4–7.7)
Platelets: 215 10*3/uL (ref 150.0–400.0)
RBC: 4.36 Mil/uL (ref 3.87–5.11)
RDW: 12.7 % (ref 11.5–15.5)
WBC: 7.4 10*3/uL (ref 4.0–10.5)

## 2018-02-09 LAB — T4, FREE: FREE T4: 0.89 ng/dL (ref 0.60–1.60)

## 2018-02-09 LAB — TSH: TSH: 0.54 u[IU]/mL (ref 0.35–4.50)

## 2018-02-09 LAB — LDL CHOLESTEROL, DIRECT: LDL DIRECT: 70 mg/dL

## 2018-02-09 LAB — MICROALBUMIN / CREATININE URINE RATIO
Creatinine,U: 20.3 mg/dL
MICROALB/CREAT RATIO: 3.4 mg/g (ref 0.0–30.0)
Microalb, Ur: <0.7 mg/dL (ref 0.0–1.9)

## 2018-02-09 LAB — T3, FREE: T3 FREE: 3.2 pg/mL (ref 2.3–4.2)

## 2018-02-09 NOTE — Patient Instructions (Addendum)
Valerie Vargas , Thank you for taking time to come for your Medicare Wellness Visit. I appreciate your ongoing commitment to your health goals. Please review the following plan we discussed and let me know if I can assist you in the future.   These are the goals we discussed: Goals    . Patient Stated     Starting 02/09/2018, I will continue to walk 6000-10000 steps daily.        This is a list of the screening recommended for you and due dates:  Health Maintenance  Topic Date Due  . Complete foot exam   02/14/2018  . Flu Shot  02/15/2018  . Mammogram  05/01/2018  . Eye exam for diabetics  05/05/2018  . Hemoglobin A1C  08/12/2018  . Urine Protein Check  02/10/2019  . Colon Cancer Screening  03/16/2023  . Tetanus Vaccine  06/02/2025  . DEXA scan (bone density measurement)  Completed  .  Hepatitis C: One time screening is recommended by Center for Disease Control  (CDC) for  adults born from [birth year] through 1967.   Completed  . Pneumonia vaccines  Completed   Preventive Care for Adults  A healthy lifestyle and preventive care can promote health and wellness. Preventive health guidelines for adults include the following key practices.  . A routine yearly physical is a good way to check with your health care provider about your health and preventive screening. It is a chance to share any concerns and updates on your health and to receive a thorough exam.  . Visit your dentist for a routine exam and preventive care every 6 months. Brush your teeth twice a day and floss once a day. Good oral hygiene prevents tooth decay and gum disease.  . The frequency of eye exams is based on your age, health, family medical history, use  of contact lenses, and other factors. Follow your health care provider's recommendations for frequency of eye exams.  . Eat a healthy diet. Foods like vegetables, fruits, whole grains, low-fat dairy products, and lean protein foods contain the nutrients you need without  too many calories. Decrease your intake of foods high in solid fats, added sugars, and salt. Eat the right amount of calories for you. Get information about a proper diet from your health care provider, if necessary.  . Regular physical exercise is one of the most important things you can do for your health. Most adults should get at least 150 minutes of moderate-intensity exercise (any activity that increases your heart rate and causes you to sweat) each week. In addition, most adults need muscle-strengthening exercises on 2 or more days a week.  Silver Sneakers may be a benefit available to you. To determine eligibility, you may visit the website: www.silversneakers.com or contact program at [phone])176-8180 Mon-Fri between 8AM-8PM.   . Maintain a healthy weight. The body mass index (BMI) is a screening tool to identify possible weight problems. It provides an estimate of body fat based on height and weight. Your health care provider can find your BMI and can help you achieve or maintain a healthy weight.   For adults 20 years and older: ? A BMI below 18.5 is considered underweight. ? A BMI of 18.5 to 24.9 is normal. ? A BMI of 25 to 29.9 is considered overweight. ? A BMI of 30 and above is considered obese.   . Maintain normal blood lipids and cholesterol levels by exercising and minimizing your intake of saturated fat. Eat a balanced  diet with plenty of fruit and vegetables. Blood tests for lipids and cholesterol should begin at age 70 and be repeated every 5 years. If your lipid or cholesterol levels are high, you are over 50, or you are at high risk for heart disease, you may need your cholesterol levels checked more frequently. Ongoing high lipid and cholesterol levels should be treated with medicines if diet and exercise are not working.  . If you smoke, find out from your health care provider how to quit. If you do not use tobacco, please do not start.  . If you choose to drink alcohol,  please do not consume more than 2 drinks per day. One drink is considered to be 12 ounces (355 mL) of beer, 5 ounces (148 mL) of wine, or 1.5 ounces (44 mL) of liquor.  . If you are 28-55 years old, ask your health care provider if you should take aspirin to prevent strokes.  . Use sunscreen. Apply sunscreen liberally and repeatedly throughout the day. You should seek shade when your shadow is shorter than you. Protect yourself by wearing long sleeves, pants, a wide-brimmed hat, and sunglasses year round, whenever you are outdoors.  . Once a month, do a whole body skin exam, using a mirror to look at the skin on your back. Tell your health care provider of new moles, moles that have irregular borders, moles that are larger than a pencil eraser, or moles that have changed in shape or color.

## 2018-02-12 ENCOUNTER — Other Ambulatory Visit: Payer: Self-pay | Admitting: Family Medicine

## 2018-02-12 NOTE — Progress Notes (Signed)
PCP notes:   Health maintenance:  Foot exam - PCP please address at next appt A1C - completed Microalbumin - completed  Abnormal screenings:   Hearing - failed  Hearing Screening   125Hz  250Hz  500Hz  1000Hz  2000Hz  3000Hz  4000Hz  6000Hz  8000Hz   Right ear:   40 40 40  40    Left ear:   0 0 40  40     Patient concerns:   Patient wants to discuss hearing and back pain. Patient wants MRI of back.   Nurse concerns:  None  Next PCP appt:   02/23/2018@ 0915

## 2018-02-12 NOTE — Progress Notes (Signed)
Subjective:   Valerie Vargas is a 72 y.o. female who presents for Medicare Annual (Subsequent) preventive examination.  Review of Systems:  N/A Cardiac Risk Factors include: advanced age (>22men, >33 women);hypertension;diabetes mellitus;dyslipidemia     Objective:     Vitals: BP 128/74 (BP Location: Right Arm, Patient Position: Sitting, Cuff Size: Normal)   Pulse 71   Temp 98.1 F (36.7 C) (Oral)   Ht 5' 2.5" (1.588 m) Comment: no shoes  Wt 139 lb 8 oz (63.3 kg)   SpO2 98%   BMI 25.11 kg/m   Body mass index is 25.11 kg/m.  Advanced Directives 02/09/2018 02/08/2017 01/14/2016  Does Patient Have a Medical Advance Directive? Yes Yes Yes  Type of Paramedic of [place];Living will [place];Living will [place];Living will  Does patient want to make changes to medical advance directive? - - No - Patient declined  Copy of [place] in Chart? No - copy requested No - copy requested No - copy requested    Tobacco Social History   Tobacco Use  Smoking Status Never Smoker  Smokeless Tobacco Never Used     Counseling given: No   Clinical Intake:  Pre-visit preparation completed: Yes  Pain : No/denies pain Pain Score: 0-No pain     Nutritional Status: BMI 25 -29 Overweight Nutritional Risks: None Diabetes: No  How often do you need to have someone help you when you read instructions, pamphlets, or other written materials from your doctor or pharmacy?: 1 - Never What is the last grade level you completed in school?: 12th grade + 2 yrs college  Interpreter Needed?: No  Comments: pt lives with spouse Information entered by :: LPinson, LPN  Past Medical History:  Diagnosis Date  . Allergy, unspecified not elsewhere classified   . Chronic interstitial cystitis   . Cough   . Esophageal reflux   . H/O syncope 2007   No recurrence;Tilt table 09/23/05-negative  . Hyperlipidemia 08/22/2014    . Hypertension   . Hypertriglyceridemia   . Irritable bowel syndrome   . Nonocclusive coronary atherosclerosis of native coronary artery   . Obstructive sleep apnea (adult) (pediatric)   . Paroxysmal ventricular tachycardia ([place])   . Type II or unspecified type diabetes mellitus without mention of complication, not stated as uncontrolled   . Unspecified asthma(493.90)   . Unspecified hypothyroidism   . Ventricular tachycardia [facility])    Past Surgical History:  Procedure Laterality Date  . ABDOMINAL HYSTERECTOMY  11/1979   for endometriosis  . CHOLECYSTECTOMY  07/2000  . ESOPHAGOGASTRODUODENOSCOPY ENDOSCOPY  2010   Family History  Problem Relation Age of Onset  . Diabetes Mother   . Breast cancer Mother   . Arthritis Mother   . Diabetes Father   . Stomach cancer Father   . Heart disease Father   . Leukemia Brother   . Breast cancer Maternal Aunt   . Breast cancer Paternal Aunt    Social History   Socioeconomic History  . Marital status: Married    Spouse name: Not on file  . Number of children: Not on file  . Years of education: Not on file  . Highest education level: Not on file  Occupational History  . Not on file  Social Needs  . Financial resource strain: Not on file  . Food insecurity:    Worry: Not on file    Inability: Not on file  . Transportation needs:  Medical: Not on file    Non-medical: Not on file  Tobacco Use  . Smoking status: Never Smoker  . Smokeless tobacco: Never Used  Substance and Sexual Activity  . Alcohol use: No  . Drug use: No  . Sexual activity: Never  Lifestyle  . Physical activity:    Days per week: Not on file    Minutes per session: Not on file  . Stress: Not on file  Relationships  . Social connections:    Talks on phone: Not on file    Gets together: Not on file    Attends religious service: Not on file    Active member of club or organization: Not on file    Attends meetings of clubs or organizations: Not on file     Relationship status: Not on file  Other Topics Concern  . Not on file  Social History Narrative  . Not on file    Outpatient Encounter Medications as of 02/09/2018  Medication Sig  . ALPRAZolam (XANAX) 0.5 MG tablet TAKE 1 TABLET BY MOUTH EVERY DAY AS NEEDED  . Ascorbic Acid (VITAMIN C) 1000 MG tablet Take 1,000 mg by mouth daily.    . benzonatate (TESSALON) 100 MG capsule TAKE TWO CAPSULES THREE TIMES DAILY AS NEEDED FOR COUGH  . Cholecalciferol (VITAMIN D3) 1000 UNITS CAPS Take 1 capsule by mouth daily.    . Diphenhydramine-PE-APAP (BENADRYL ALLERGY/SINUS HEADACH) 12.5-5-325 MG TABS Take 0.5 tablets by mouth as needed.   . gabapentin (NEURONTIN) 100 MG tablet Take 300 mg by mouth 3 (three) times daily.    Marland Kitchen glucose blood (ONE TOUCH ULTRA TEST) test strip -TEST TWICE DAILY  . Guaifenesin 1200 MG TB12 Take by mouth as needed.   Marland Kitchen L-Theanine 100 MG CAPS Take 2 tablets by mouth as needed.   Marland Kitchen levothyroxine (SYNTHROID, LEVOTHROID) 75 MCG tablet Take 75 mcg by mouth daily before breakfast.  . loratadine (CLARITIN) 10 MG tablet Take 10 mg by mouth daily.  Marland Kitchen MAGNESIUM GLYCINATE PLUS PO Take 1 capsule by mouth daily.   . metFORMIN (GLUCOPHAGE-XR) 500 MG 24 hr tablet   . metoprolol succinate (TOPROL-XL) 50 MG 24 hr tablet TAKE 1 TABLET BY MOUTH EVERY DAY (TAKE WITH OR IMMEDIATELY FOLLOWING A MEAL)  . metoprolol succinate (TOPROL-XL) 50 MG 24 hr tablet Take 1 tablet (50 mg total) by mouth daily. Keep OV  . OMEGA-3 1000 MG CAPS Take 1,000 mg by mouth daily.    . promethazine-dextromethorphan (PROMETHAZINE-DM) 6.25-15 MG/5ML syrup Take 5 mLs by mouth 4 (four) times daily as needed for cough.  . Pyridoxine HCl (VITAMIN B-6 PO) Take 1 tablet by mouth daily.  . rosuvastatin (CRESTOR) 10 MG tablet TAKE 1 TABLET BY MOUTH EVERY DAY FOR CHOLESTEROL  . Thiamine HCl (VITAMIN B-1) 250 MG tablet Take 250 mg by mouth daily.    . VENTOLIN HFA 108 (90 Base) MCG/ACT inhaler USE 2 PUFFS BY MOUTH EVERY 6 HOURS AS  NEEDED FOR WHEEZING FOR SHORTNESS OF BREATH  . vitamin B-12 (CYANOCOBALAMIN) 1000 MCG tablet Take 1,000 mcg by mouth daily.    . [DISCONTINUED] aspirin 81 MG tablet Take 81 mg by mouth daily.    . [DISCONTINUED] ONETOUCH DELICA LANCETS 24O MISC TEST TWICE DAILY  Dx: E11.9   No facility-administered encounter medications on file as of 02/09/2018.     Activities of Daily Living In your present state of health, do you have any difficulty performing the following activities: 02/09/2018  Hearing? Y  Vision? N  Difficulty concentrating or making decisions? N  Walking or climbing stairs? N  Dressing or bathing? N  Doing errands, shopping? N  Preparing Food and eating ? N  Using the Toilet? N  In the past six months, have you accidently leaked urine? N  Do you have problems with loss of bowel control? N  Managing your Medications? N  Managing your Finances? N  Housekeeping or managing your Housekeeping? N  Some recent data might be hidden    Patient Care Team: Jinny Sanders, MD as PCP - General (Family Medicine) Jacelyn Pi, MD as Attending Physician (Endocrinology) Ernestine Conrad, MD as Referring Physician (Otolaryngology) Sanda Klein, MD as Consulting Physician (Cardiology) Jola Schmidt, MD as Consulting Physician (Ophthalmology) Levin Erp, DDS, PA as Referring Physician (Dentistry)    Assessment:   This is a routine wellness examination for [place].   Hearing Screening   125Hz  250Hz  500Hz  1000Hz  2000Hz  3000Hz  4000Hz  6000Hz  8000Hz   Right ear:   40 40 40  40    Left ear:   0 0 40  40    Vision Screening Comments: Vision exam in Oct 2018 with Dr. Valetta Close    Exercise Activities and Dietary recommendations Current Exercise Habits: Home exercise routine, Type of exercise: walking;Other - see comments(back exercises), Time (Minutes): > 60(6000-10000 steps daily), Frequency (Times/Week): 7, Weekly Exercise (Minutes/Week): 0, Intensity: Mild, Exercise limited by:  None identified  Goals    . Patient Stated     Starting 02/09/2018, I will continue to walk 6000-10000 steps daily.        Fall Risk Fall Risk  02/09/2018 02/08/2017 01/14/2016 08/15/2013  Falls in the past year? No No No No   Depression Screen PHQ 2/9 Scores 02/09/2018 02/08/2017 01/14/2016 11/06/2014  PHQ - 2 Score 0 0 0 0  PHQ- 9 Score 0 - - -     Cognitive Function MMSE - Mini Mental State Exam 02/09/2018 02/08/2017 01/14/2016  Orientation to time 5 5 5   Orientation to Place 5 5 5   Registration 3 3 3   Attention/ Calculation 0 0 0  Recall 3 2 3   Language- name 2 objects 0 0 0  Language- repeat 1 1 1   Language- follow 3 step command 3 3 3   Language- read & follow direction 0 0 0  Write a sentence 0 0 0  Copy design 0 0 0  Total score 20 19 20    PLEASE NOTE: A Mini-Cog screen was completed. Maximum score is 20. A value of 0 denotes this part of [place] MMSE was not completed or the patient failed this part of the Mini-Cog screening.   Mini-Cog Screening Orientation to Time - Max 5 pts Orientation to Place - Max 5 pts Registration - Max 3 pts Recall - Max 3 pts Language Repeat - Max 1 pts Language Follow 3 Step Command - Max 3 pts     Immunization History  Administered Date(s) Administered  . Influenza Split 05/15/2013  . Influenza, High Dose Seasonal PF 05/09/2017  . Influenza-Unspecified 05/01/2014, 06/03/2015, 04/19/2016  . Pneumococcal Conjugate-13 08/15/2012  . Pneumococcal Polysaccharide-23 11/06/2014  . Tdap 06/03/2015  . Zoster 03/06/2013    Screening Tests Health Maintenance  Topic Date Due  . FOOT EXAM  02/14/2018  . INFLUENZA VACCINE  02/15/2018  . MAMMOGRAM  05/01/2018  . OPHTHALMOLOGY EXAM  05/05/2018  . HEMOGLOBIN A1C  08/12/2018  . URINE MICROALBUMIN  02/10/2019  . COLONOSCOPY  03/16/2023  . TETANUS/TDAP  06/02/2025  . DEXA SCAN  Completed  . Hepatitis C Screening  Completed  . PNA vac Low Risk Adult  Completed       Plan:     I have  personally reviewed, addressed, and noted the following in the patient's chart:  A. Medical and social history B. Use of alcohol, tobacco or illicit drugs  C. Current medications and supplements D. Functional ability and status E.  Nutritional status F.  Physical activity G. Advance directives H. List of other physicians I.  Hospitalizations, surgeries, and ER visits in previous 12 months J.  [place] to include hearing, vision, cognitive, depression L. Referrals and appointments - none  In addition, I have reviewed and discussed with patient certain preventive protocols, quality metrics, and best practice recommendations. A written personalized care plan for preventive services as well as general preventive health recommendations were provided to patient.  See attached scanned questionnaire for additional information.   Signed,   Lindell Noe, MHA, BS, LPN Health Coach

## 2018-02-13 NOTE — Progress Notes (Signed)
I reviewed health advisor's note, was available for consultation, and agree with documentation and plan.  

## 2018-02-16 ENCOUNTER — Encounter: Payer: PPO | Admitting: Family Medicine

## 2018-02-21 ENCOUNTER — Other Ambulatory Visit: Payer: Self-pay | Admitting: Family Medicine

## 2018-02-23 ENCOUNTER — Encounter: Payer: Self-pay | Admitting: Family Medicine

## 2018-02-23 ENCOUNTER — Ambulatory Visit (INDEPENDENT_AMBULATORY_CARE_PROVIDER_SITE_OTHER)
Admission: RE | Admit: 2018-02-23 | Discharge: 2018-02-23 | Disposition: A | Discharge status: Home / Self Care | Payer: PPO | Source: Ambulatory Visit | Attending: Family Medicine | Admitting: Family Medicine

## 2018-02-23 ENCOUNTER — Ambulatory Visit (INDEPENDENT_AMBULATORY_CARE_PROVIDER_SITE_OTHER): Payer: PPO | Admitting: Family Medicine

## 2018-02-23 VITALS — BP 124/64 | HR 73 | Temp 97.7°F | Ht 62.5 in | Wt 139.8 lb

## 2018-02-23 DIAGNOSIS — Z0000 Encounter for general adult medical examination without abnormal findings: Secondary | ICD-10-CM

## 2018-02-23 DIAGNOSIS — E119 Type 2 diabetes mellitus without complications: Secondary | ICD-10-CM

## 2018-02-23 DIAGNOSIS — M5416 Radiculopathy, lumbar region: Secondary | ICD-10-CM

## 2018-02-23 DIAGNOSIS — I10 Essential (primary) hypertension: Secondary | ICD-10-CM | POA: Diagnosis not present

## 2018-02-23 DIAGNOSIS — E7800 Pure hypercholesterolemia, unspecified: Secondary | ICD-10-CM | POA: Diagnosis not present

## 2018-02-23 DIAGNOSIS — E038 Other specified hypothyroidism: Secondary | ICD-10-CM

## 2018-02-23 DIAGNOSIS — D508 Other iron deficiency anemias: Secondary | ICD-10-CM | POA: Diagnosis not present

## 2018-02-23 DIAGNOSIS — M545 Low back pain: Secondary | ICD-10-CM | POA: Diagnosis not present

## 2018-02-23 LAB — HM DIABETES FOOT EXAM

## 2018-02-23 MED ORDER — ONETOUCH DELICA LANCETS 33G MISC: 5 refills | Status: DC

## 2018-02-23 MED ORDER — MELOXICAM 15 MG PO TABS: 15.0000 mg | ORAL_TABLET | Freq: Every day | ORAL | 0 refills | Status: DC

## 2018-02-23 NOTE — Assessment & Plan Note (Signed)
Followed by ENDO. Good control on metformin.

## 2018-02-23 NOTE — Assessment & Plan Note (Signed)
Well controlled. Continue current medication.  

## 2018-02-23 NOTE — Assessment & Plan Note (Signed)
Resolved

## 2018-02-23 NOTE — Patient Instructions (Addendum)
Start course of  Meloxicam. Stop tyelnol.  Take gabapentin 300 mg three times daily.  Call if not improving in 2 weeks for MRI eval. We will call with X-ray results.  Call to set up follow up with ENT for decreased hearing.

## 2018-02-23 NOTE — Progress Notes (Signed)
Subjective:    Patient ID: Valerie Vargas, female    DOB: 1946-01-26, 72 y.o.   MRN: [id number]  HPI   The patient presents for  complete physical and review of chronic health problems. He/She also has the following acute concerns today: Decreased hearing and chronic low back pain  The patient saw Candis Musa, LPN for medicare wellness. Note reviewed in detail and important notes copied below.  Health maintenance:  Foot exam - PCP please address at next appt A1C - completed Microalbumin - completed  Abnormal screenings:   Hearing - failed             Hearing Screening   125Hz  250Hz  500Hz  1000Hz  2000Hz  3000Hz  4000Hz  6000Hz  8000Hz   Right ear:   40 40 40  40    Left ear:   0 0 40  40      02/23/18 Today   Low back pain, chronic:  off and on since fall in 1980. Flare of pain in 2013.  MRI in 2006.. No surgical indications.  Has had flare of low back pain  Since 01/08/2018.  Pain in right lower back radiating to right leg to thigh and calf. No weakness, no numbness. Pain is intermittent. At times she cannot funtion for pain.   Worse with standing and bending forward.  Xray in past 2013: IMPRESSION: Changes of degenerative disc disease and degenerative spondylosis. Has seen chiropractor weekly for years. Has helped some off and on.  Does home stretching, heat.  Uses tylenol for flares. She is on gabapentin  (300  QD to TID) for her vocal cord dysfunction.  Has an inversion table.   She does not tolerate prednisone.  In past diclofenac has not helped.   Decreased hearing:  Has tinnitus and note significant hearing loss of phone and with TV, church.Has seen ENT in past. She would like to see a different ENT for a second opinion.  Hypertension:   Good control on metoprolol BP Readings from Last 3 Encounters:  02/23/18 124/64  02/09/18 128/74  02/05/18 140/76  Using medication without problems or lightheadedness:  none Chest pain with  exertion:none Edema:none Short of breath:none Average home BPs: Other issues:  Diabetes:   Good control on metformin. Followed by Dr. Chalmers Cater, ENDO. Lab Results  Component Value Date   HGBA1C 6.2 02/09/2018  Using medications without difficulties: Hypoglycemic episodes:none Hyperglycemic episodes: occ if eating poorly Feet problems:none Blood Sugars averaging: FBS 100s eye exam within last year: 04/2017   Hypothyroidism:  Stable on levo 75 mcg daily Lab Results  Component Value Date   TSH 0.54 02/09/2018   Elevated Cholesterol:  LDL at goa < 100 on crestor 10 mg daily Lab Results  Component Value Date   CHOL 146 02/09/2018   HDL 50.80 02/09/2018   LDLCALC 71 10/30/2014   LDLDIRECT 70.0 02/09/2018   TRIG 238.0 (H) 02/09/2018   CHOLHDL 3 02/09/2018  Using medications without problems: none Muscle aches: none Diet compliance: good Exercise: yes Other complaints:  Social History /Family History/Past Medical History reviewed in detail and updated in EMR if needed. Blood pressure 124/64, pulse 73, temperature 97.7 F (36.5 C), temperature source Oral, height 5' 2.5" (1.588 m), weight 139 lb 12 oz (63.4 kg).  Review of Systems  Constitutional: Negative for fatigue and fever.  HENT: Negative for congestion.   Eyes: Negative for pain.  Respiratory: Negative for cough and shortness of breath.   Cardiovascular: Negative for chest pain, palpitations and leg swelling.  Gastrointestinal: Negative for abdominal pain.  Genitourinary: Negative for dysuria and vaginal bleeding.  Musculoskeletal: Negative for back pain.  Neurological: Negative for syncope, light-headedness and headaches.  Psychiatric/Behavioral: Negative for dysphoric mood.       Objective:   Physical Exam  Constitutional: Vital signs are normal. She appears well-developed and well-nourished. She is cooperative.  Non-toxic appearance. She does not appear ill. No distress.  HENT:  Head: Normocephalic.  Right  Ear: Hearing, tympanic membrane, external ear and ear canal normal.  Left Ear: Hearing, tympanic membrane, external ear and ear canal normal.  Nose: Nose normal.  Eyes: Pupils are equal, round, and reactive to light. Conjunctivae, EOM and lids are normal. Lids are everted and swept, no foreign bodies found.  Neck: Trachea normal and normal range of motion. Neck supple. Carotid bruit is not present. No thyroid mass and no thyromegaly present.  Cardiovascular: Normal rate, regular rhythm, S1 normal, S2 normal, normal heart sounds and intact distal pulses. Exam reveals no gallop.  No murmur heard. Pulmonary/Chest: Effort normal and breath sounds normal. No respiratory distress. She has no wheezes. She has no rhonchi. She has no rales.  Abdominal: Soft. Normal appearance and bowel sounds are normal. She exhibits no distension, no fluid wave, no abdominal bruit and no mass. There is no hepatosplenomegaly. There is no tenderness. There is no rebound, no guarding and no CVA tenderness. No hernia.  Musculoskeletal:       Lumbar back: She exhibits decreased range of motion and tenderness. She exhibits no bony tenderness.  ttp in right sciatic notch, neg SLR  Lymphadenopathy:    She has no cervical adenopathy.    She has no axillary adenopathy.  Neurological: She is alert. She has normal strength. No cranial nerve deficit or sensory deficit.  Skin: Skin is warm, dry and intact. No rash noted.  Psychiatric: Her speech is normal and behavior is normal. Judgment normal. Her mood appears not anxious. Cognition and memory are normal. She does not exhibit a depressed mood.    Diabetic foot exam: Normal inspection No skin breakdown No calluses  Normal DP pulses Normal sensation to light touch and monofilament Nails normal       Assessment & Plan:  The patient's preventative maintenance and recommended screening tests for an annual wellness exam were reviewed in full today. Brought up to date unless  services declined.  Counselled on the importance of diet, exercise, and its role in overall health and mortality. The patient's FH and SH was reviewed, including their home life, tobacco status, and drug and alcohol status.   Colon: 03/15/2013 nml.. Due for 10 year repeat in 2024. Nonsmoker DEXA: Normal 04/2017, can repeat in 5 years.  Mammogram last 04/2017 PAP not indicated, DVE not indicated total hysterecotmy Vaccine: uptodate  Hep C neg

## 2018-02-23 NOTE — Addendum Note (Signed)
Addended by: Carter Kitten on: 02/23/2018 10:32 AM   Modules accepted: Orders

## 2018-02-26 DIAGNOSIS — E039 Hypothyroidism, unspecified: Secondary | ICD-10-CM | POA: Diagnosis not present

## 2018-02-26 DIAGNOSIS — E1165 Type 2 diabetes mellitus with hyperglycemia: Secondary | ICD-10-CM | POA: Diagnosis not present

## 2018-02-26 DIAGNOSIS — R0902 Hypoxemia: Secondary | ICD-10-CM | POA: Diagnosis not present

## 2018-02-26 DIAGNOSIS — I10 Essential (primary) hypertension: Secondary | ICD-10-CM | POA: Diagnosis not present

## 2018-02-26 DIAGNOSIS — R7989 Other specified abnormal findings of blood chemistry: Secondary | ICD-10-CM | POA: Diagnosis not present

## 2018-02-26 DIAGNOSIS — E7800 Pure hypercholesterolemia, unspecified: Secondary | ICD-10-CM | POA: Diagnosis not present

## 2018-02-26 DIAGNOSIS — J449 Chronic obstructive pulmonary disease, unspecified: Secondary | ICD-10-CM | POA: Diagnosis not present

## 2018-03-15 DIAGNOSIS — M7671 Peroneal tendinitis, right leg: Secondary | ICD-10-CM | POA: Diagnosis not present

## 2018-03-15 DIAGNOSIS — M7672 Peroneal tendinitis, left leg: Secondary | ICD-10-CM | POA: Diagnosis not present

## 2018-03-15 DIAGNOSIS — M21961 Unspecified acquired deformity of right lower leg: Secondary | ICD-10-CM | POA: Diagnosis not present

## 2018-03-26 ENCOUNTER — Telehealth: Payer: Self-pay

## 2018-03-26 DIAGNOSIS — G4734 Idiopathic sleep related nonobstructive alveolar hypoventilation: Secondary | ICD-10-CM

## 2018-03-28 ENCOUNTER — Telehealth: Payer: Self-pay | Admitting: *Deleted

## 2018-03-28 NOTE — Telephone Encounter (Signed)
Overnight oximetry test ordered at last office visit. Results came back abnormal. See scanned report for details. Per MCr - schedule patient for formal sleep study.   Sleep study ordered. Message routed to sleep services personnel for pre-cert and scheduling.

## 2018-03-28 NOTE — Telephone Encounter (Signed)
Submitted PA request foe sleep study to [facility] via web portal.

## 2018-03-30 ENCOUNTER — Telehealth: Payer: Self-pay | Admitting: *Deleted

## 2018-03-30 NOTE — Telephone Encounter (Signed)
-----   Message from Lauralee Evener, [place] sent at 03/28/2018 11:38 AM EDT ----- Spit night sleep study.

## 2018-03-30 NOTE — Telephone Encounter (Signed)
Patient notified of sleep study appointment scheduled for October 15th @ [facility].

## 2018-04-02 ENCOUNTER — Other Ambulatory Visit: Payer: Self-pay | Admitting: Cardiovascular Disease

## 2018-04-05 DIAGNOSIS — M21961 Unspecified acquired deformity of right lower leg: Secondary | ICD-10-CM | POA: Diagnosis not present

## 2018-04-05 DIAGNOSIS — M7672 Peroneal tendinitis, left leg: Secondary | ICD-10-CM | POA: Diagnosis not present

## 2018-04-05 DIAGNOSIS — M7671 Peroneal tendinitis, right leg: Secondary | ICD-10-CM | POA: Diagnosis not present

## 2018-05-01 ENCOUNTER — Ambulatory Visit (HOSPITAL_BASED_OUTPATIENT_CLINIC_OR_DEPARTMENT_OTHER): Payer: PPO | Attending: Cardiovascular Disease | Admitting: Cardiovascular Disease

## 2018-05-01 VITALS — Ht 63.0 in | Wt 132.0 lb

## 2018-05-01 DIAGNOSIS — G4734 Idiopathic sleep related nonobstructive alveolar hypoventilation: Secondary | ICD-10-CM | POA: Diagnosis not present

## 2018-05-01 DIAGNOSIS — Z7984 Long term (current) use of oral hypoglycemic drugs: Secondary | ICD-10-CM | POA: Insufficient documentation

## 2018-05-01 DIAGNOSIS — I493 Ventricular premature depolarization: Secondary | ICD-10-CM | POA: Diagnosis not present

## 2018-05-01 DIAGNOSIS — R0902 Hypoxemia: Secondary | ICD-10-CM | POA: Diagnosis not present

## 2018-05-01 DIAGNOSIS — E119 Type 2 diabetes mellitus without complications: Secondary | ICD-10-CM | POA: Diagnosis not present

## 2018-05-01 DIAGNOSIS — I10 Essential (primary) hypertension: Secondary | ICD-10-CM | POA: Diagnosis not present

## 2018-05-01 DIAGNOSIS — G4733 Obstructive sleep apnea (adult) (pediatric): Secondary | ICD-10-CM | POA: Insufficient documentation

## 2018-05-01 DIAGNOSIS — R5383 Other fatigue: Secondary | ICD-10-CM | POA: Insufficient documentation

## 2018-05-01 DIAGNOSIS — Z79899 Other long term (current) drug therapy: Secondary | ICD-10-CM | POA: Diagnosis not present

## 2018-05-01 DIAGNOSIS — Z791 Long term (current) use of non-steroidal anti-inflammatories (NSAID): Secondary | ICD-10-CM | POA: Insufficient documentation

## 2018-05-01 DIAGNOSIS — Z79890 Hormone replacement therapy (postmenopausal): Secondary | ICD-10-CM | POA: Insufficient documentation

## 2018-05-02 ENCOUNTER — Telehealth: Payer: Self-pay | Admitting: Family Medicine

## 2018-05-02 MED ORDER — SCOPOLAMINE 1 MG/3DAYS TD PT72: 1.0000 | MEDICATED_PATCH | TRANSDERMAL | 0 refills | Status: DC

## 2018-05-02 NOTE — Telephone Encounter (Signed)
Sent in 4... Okay to change quantity to 10 if going longer than 7 days.

## 2018-05-02 NOTE — Telephone Encounter (Signed)
Pt seen 02/23/18 for annual exam.Please advise.

## 2018-05-02 NOTE — Telephone Encounter (Signed)
Valerie Vargas notified that prescription has been sent to her pharmacy.  Her cruise is less that 10 days.

## 2018-05-02 NOTE — Telephone Encounter (Signed)
Copied from [place] [id number]. Topic: General - Inquiry >> May 02, 2018 10:54 AM Margot Ables wrote: Reason for CRM: pt is going on a cruise leaving 05/16/18 and is wanting to get a patch to help with sea sickness. Would Dr. Diona Browner send in? Please call pt to notify - ok to leave [place], [place] - [address] [phone] (Phone) [phone] (Fax)

## 2018-05-13 ENCOUNTER — Encounter (HOSPITAL_BASED_OUTPATIENT_CLINIC_OR_DEPARTMENT_OTHER): Payer: Self-pay | Admitting: Cardiovascular Disease

## 2018-05-13 NOTE — Procedures (Signed)
Patient Name: Valerie Vargas, Valerie Vargas Date: 05/01/2018 Gender: Female D.O.B: 05-28-1946 Age (years): 27 Referring Provider: Dani Gobble Croitoru Height (inches): 63 Interpreting Physician: Shelva Majestic MD, ABSM Weight (lbs): 132 RPSGT: Laren Everts BMI: 23 MRN: [id number] Neck Size: 14.00  CLINICAL INFORMATION Sleep Study Type: NPSG  Indication for sleep study: Diabetes, Fatigue, Hypertension  Epworth Sleepiness Score: 13  Most recent polysomnogram dated 01/30/2004 revealed RDI of 9.3/h.  SLEEP STUDY TECHNIQUE As per the AASM Manual for the Scoring of Sleep and Associated Events v2.3 (April 2016) with a hypopnea requiring 4% desaturations.  The channels recorded and monitored were frontal, central and occipital EEG, electrooculogram (EOG), submentalis EMG (chin), nasal and oral airflow, thoracic and abdominal wall motion, anterior tibialis EMG, snore microphone, electrocardiogram, and pulse oximetry.  MEDICATIONS     ALPRAZolam (XANAX) 0.5 MG tablet             Ascorbic Acid (VITAMIN C) 1000 MG tablet         benzonatate (TESSALON) 100 MG capsule         Cholecalciferol (VITAMIN D3) 1000 UNITS CAPS         Diphenhydramine-PE-APAP (BENADRYL ALLERGY/SINUS HEADACH) 12.5-5-325 MG TABS         gabapentin (NEURONTIN) 100 MG tablet         glucose blood (ONE TOUCH ULTRA TEST) test strip         Guaifenesin 1200 MG TB12         L-Theanine 100 MG CAPS         levothyroxine (SYNTHROID, LEVOTHROID) 75 MCG tablet         loratadine (CLARITIN) 10 MG tablet         MAGNESIUM GLYCINATE PLUS PO         meloxicam (MOBIC) 15 MG tablet         metFORMIN (GLUCOPHAGE-XR) 500 MG 24 hr tablet         metoprolol succinate (TOPROL-XL) 50 MG 24 hr tablet         metoprolol succinate (TOPROL-XL) 50 MG 24 hr tablet         OMEGA-3 1000 MG CAPS         ONETOUCH DELICA LANCETS 51V MISC         promethazine-dextromethorphan (PROMETHAZINE-DM) 6.25-15 MG/5ML syrup         Pyridoxine HCl (VITAMIN  B-6 PO)         rosuvastatin (CRESTOR) 10 MG tablet         scopolamine (TRANSDERM-SCOP, 1.5 MG,) 1 MG/3DAYS         Thiamine HCl (VITAMIN B-1) 250 MG tablet         VENTOLIN HFA 108 (90 Base) MCG/ACT inhaler         vitamin B-12 (CYANOCOBALAMIN) 1000 MCG tablet       Medications self-administered by patient taken the night of the study : N/A  SLEEP ARCHITECTURE The study was initiated at 10:19:36 PM and ended at 4:33:36 AM.  Sleep onset time was 6.9 minutes and the sleep efficiency was 83.2%%. The total sleep time was 311 minutes. Wake after sleep onset (WASO) was 56.1 minutes.  Stage REM latency was 73.0 minutes.  The patient spent 7.2%% of the night in stage N1 sleep, 72.5%% in stage N2 sleep, 0.0%% in stage N3 and 20.3% in REM.  Alpha intrusion was absent.  Supine sleep was 60.18%.  RESPIRATORY PARAMETERS The overall apnea/hypopnea index (AHI) was 6.2 per hour.  The respiratory disturbance  index (RDI) was 7.7/h. There were 11 total apneas, including 11 obstructive, 0 central and 0 mixed apneas. There were 21 hypopneas and 8 RERAs.  The AHI during Stage REM sleep was 16.2 per hour.  AHI while supine was 9.9 per hour.  The mean oxygen saturation was 95.0%. The minimum SpO2 during sleep was 78.0% during REM sleep.  Moderate snoring was noted during this study.  CARDIAC DATA The 2 lead EKG demonstrated sinus rhythm. The mean heart rate was 58.5 beats per minute. Other EKG findings include: PVCs.  LEG MOVEMENT DATA The total PLMS were 0 with a resulting PLMS index of 0.0. Associated arousal with leg movement index was 0.0 .  IMPRESSIONS - Mild obstructive sleep apnea occurred during this study (AHI  6.2/h; RDI 7.7/h). Events were worse with supine sleep (AHI 9.9/h) and sleep apnea was moderate during REM sleep (AHI 16.2/h). - No significant central sleep apnea occurred during this study (CAI 0.0/h). - Significant oxygen desaturation to a nadir of 78% during REM sleep. -  The patient snored with moderate snoring volume. - EKG findings include PVCs. - Clinically significant periodic limb movements did not occur during sleep. No significant associated arousals.  DIAGNOSIS - Obstructive Sleep Apnea (327.23 [G47.33 ICD-10]) - Nocturnal Hypoxemia (327.26 [G47.36 ICD-10])  RECOMMENDATIONS - In this patient with cardiovascular co-morbidities and moderate OSA during REM sleep with oxygen desaturation to 78%, recommend CPAP titration for optimal treatment.  - Efforts should be made to optimize nasal and oropharyngeal patency.  - Positional therapy avoiding supine position during sleep. - If patient is against re-initiating CPAP, recommend a customized oral appliance for alternative treatment. - Avoid alcohol, sedatives and other CNS depressants that may worsen sleep apnea and disrupt normal sleep architecture. - Sleep hygiene should be reviewed to assess factors that may improve sleep quality. - Weight management and regular exercise should be initiated or continued if appropriate.  [Electronically signed] 05/13/2018 11:59 AM  Shelva Majestic MD, Tomasa Hose Diplomate, American Board of Sleep Medicine   NPI: [id number] [place] PH: [phone]   FX: [phone] [place]

## 2018-05-14 ENCOUNTER — Other Ambulatory Visit: Payer: Self-pay | Admitting: Cardiovascular Disease

## 2018-05-14 ENCOUNTER — Telehealth: Payer: Self-pay | Admitting: *Deleted

## 2018-05-14 DIAGNOSIS — G4733 Obstructive sleep apnea (adult) (pediatric): Secondary | ICD-10-CM

## 2018-05-14 DIAGNOSIS — I2510 Atherosclerotic heart disease of native coronary artery without angina pectoris: Secondary | ICD-10-CM

## 2018-05-14 DIAGNOSIS — I10 Essential (primary) hypertension: Secondary | ICD-10-CM

## 2018-05-14 NOTE — Telephone Encounter (Signed)
-----   Message from Troy Sine, MD sent at 05/13/2018 12:15 PM EDT ----- Mariann Laster, please notify pt of results.  CPAP titration recommended, but if she does not want CPAP again then customized oral appliance can be considered.

## 2018-05-14 NOTE — Telephone Encounter (Signed)
Patient notified of sleep study results and recommendations. She has opted to go to the lab for a titration study in hopes of restarting CPAP therapy.

## 2018-05-16 ENCOUNTER — Telehealth: Payer: Self-pay | Admitting: *Deleted

## 2018-05-16 NOTE — Telephone Encounter (Signed)
-----   Message from Lauralee Evener, [place] sent at 05/14/2018 11:11 AM EDT ----- CPAP titration

## 2018-05-16 NOTE — Telephone Encounter (Signed)
PA for CPAP titration submitted to [facility] via web portal. 

## 2018-05-17 ENCOUNTER — Telehealth: Payer: Self-pay | Admitting: *Deleted

## 2018-05-17 NOTE — Telephone Encounter (Signed)
-----   Message from Lauralee Evener, [place] sent at 05/14/2018 11:11 AM EDT ----- CPAP titration

## 2018-05-17 NOTE — Telephone Encounter (Signed)
Left CPAP titration appointment details on VM. ( ok per dpr).

## 2018-06-11 ENCOUNTER — Other Ambulatory Visit: Payer: Self-pay | Admitting: Family Medicine

## 2018-06-11 DIAGNOSIS — Z1231 Encounter for screening mammogram for malignant neoplasm of breast: Secondary | ICD-10-CM

## 2018-07-01 ENCOUNTER — Ambulatory Visit (HOSPITAL_BASED_OUTPATIENT_CLINIC_OR_DEPARTMENT_OTHER): Payer: PPO | Attending: Cardiovascular Disease | Admitting: Cardiovascular Disease

## 2018-07-01 DIAGNOSIS — I10 Essential (primary) hypertension: Secondary | ICD-10-CM

## 2018-07-01 DIAGNOSIS — G4733 Obstructive sleep apnea (adult) (pediatric): Secondary | ICD-10-CM | POA: Diagnosis not present

## 2018-07-01 DIAGNOSIS — Z79899 Other long term (current) drug therapy: Secondary | ICD-10-CM | POA: Diagnosis not present

## 2018-07-01 DIAGNOSIS — Z7984 Long term (current) use of oral hypoglycemic drugs: Secondary | ICD-10-CM | POA: Insufficient documentation

## 2018-07-01 DIAGNOSIS — I2510 Atherosclerotic heart disease of native coronary artery without angina pectoris: Secondary | ICD-10-CM

## 2018-07-01 DIAGNOSIS — Z791 Long term (current) use of non-steroidal anti-inflammatories (NSAID): Secondary | ICD-10-CM | POA: Insufficient documentation

## 2018-07-01 DIAGNOSIS — Z9989 Dependence on other enabling machines and devices: Secondary | ICD-10-CM

## 2018-07-06 NOTE — Telephone Encounter (Signed)
SPOKE TO PATIENT, SHE WOULD LIKE THE NEW SETTING FROM  HER SLEEP TITRATION  TO  BE  PLACED ON HER CURRENT MACHINE .  SHE STATES SHE DOES NOT WANT TO PURCHASE ANEW MACHINE IF NOT NECESSARY,  RN INFORMED PATIENT THAT  THE DOCTOR  WHO READS THE  STUDY FOR DR CROITORU WOULD BE THE PERSON TO FOLLOW WITH THE CHANGES FOR PATIENT'S C PAP ,NOT DR [id number]   PATIENT IS AWARE THAT  THIS ENCOUNTER WILL BE SENT TO SLEEP STUDIES TECH- WILL BE CONTACT ABOUT DME  --SETTING UP THIS  PATIENT VERBALIZED UNDERSTANDING-AND DUE TO THE HOLIDAYS GIVE @ 3 WEEKS TO RESPOND

## 2018-07-13 ENCOUNTER — Encounter (HOSPITAL_BASED_OUTPATIENT_CLINIC_OR_DEPARTMENT_OTHER): Payer: Self-pay | Admitting: Cardiovascular Disease

## 2018-07-13 NOTE — Procedures (Signed)
Patient Name: Valerie Vargas, Valerie Vargas Date: 07/01/2018 Gender: Female D.O.B: Jan 07, 1946 Age (years): 62 Referring Provider: Dani Gobble Croitoru Height (inches): 63 Interpreting Physician: Shelva Majestic MD, ABSM Weight (lbs): 132 RPSGT: Jonna Coup BMI: 23 MRN: [id number] Neck Size: 14.00  CLINICAL INFORMATION The patient is referred for a CPAP titration to treat sleep apnea.  Date of NPSG: 05/01/2018: AHI 6.2/h; RDI 7.7/h; REM sleep AHI 16.2/h; supine 9.9/h; O2 desaturatin to 78%.  SLEEP STUDY TECHNIQUE As per the AASM Manual for the Scoring of Sleep and Associated Events v2.3 (April 2016) with a hypopnea requiring 4% desaturations.  The channels recorded and monitored were frontal, central and occipital EEG, electrooculogram (EOG), submentalis EMG (chin), nasal and oral airflow, thoracic and abdominal wall motion, anterior tibialis EMG, snore microphone, electrocardiogram, and pulse oximetry. Continuous positive airway pressure (CPAP) was initiated at the beginning of the study and titrated to treat sleep-disordered breathing.  MEDICATIONS  ALPRAZolam (XANAX) 0.5 MG tablet    Ascorbic Acid (VITAMIN C) 1000 MG tablet    benzonatate (TESSALON) 100 MG capsule    Cholecalciferol (VITAMIN D3) 1000 UNITS CAPS    Diphenhydramine-PE-APAP (BENADRYL ALLERGY/SINUS HEADACH) 12.5-5-325 MG TABS    gabapentin (NEURONTIN) 100 MG tablet    glucose blood (ONE TOUCH ULTRA TEST) test strip    Guaifenesin 1200 MG TB12    L-Theanine 100 MG CAPS    levothyroxine (SYNTHROID, LEVOTHROID) 75 MCG tablet    loratadine (CLARITIN) 10 MG tablet    MAGNESIUM GLYCINATE PLUS PO    meloxicam (MOBIC) 15 MG tablet    metFORMIN (GLUCOPHAGE-XR) 500 MG 24 hr tablet    metoprolol succinate (TOPROL-XL) 50 MG 24 hr tablet    metoprolol succinate (TOPROL-XL) 50 MG 24 hr tablet    OMEGA-3 1000 MG CAPS    ONETOUCH DELICA LANCETS 03J MISC    promethazine-dextromethorphan (PROMETHAZINE-DM) 6.25-15 MG/5ML syrup    Pyridoxine HCl (VITAMIN B-6 PO)    rosuvastatin (CRESTOR) 10 MG tablet    scopolamine (TRANSDERM-SCOP, 1.5 MG,) 1 MG/3DAYS    Thiamine HCl (VITAMIN B-1) 250 MG tablet    VENTOLIN HFA 108 (90 Base) MCG/ACT inhaler    vitamin B-12 (CYANOCOBALAMIN) 1000 MCG tablet     Medications self-administered by patient taken the night of the study : N/A  TECHNICIAN COMMENTS Comments added by technician: NONE Comments added by scorer: N/A  RESPIRATORY PARAMETERS Optimal PAP Pressure (cm): 14 AHI at Optimal Pressure (/hr): 0.0 Overall Minimal O2 (%): 81.0 Supine % at Optimal Pressure (%): 16 Minimal O2 at Optimal Pressure (%): 94.0   SLEEP ARCHITECTURE The study was initiated at 10:39:27 PM and ended at 4:57:22 AM.  Sleep onset time was 2.8 minutes and the sleep efficiency was 90.6%%. The total sleep time was 342.5 minutes.  The patient spent 3.2%% of the night in stage N1 sleep, 62.9%% in stage N2 sleep, 9.5%% in stage N3 and 24.4% in REM.Stage REM latency was 85.5 minutes  Wake after sleep onset was 32.6. Alpha intrusion was absent. Supine sleep was 35.33%.  CARDIAC DATA The 2 lead EKG demonstrated sinus rhythm. The mean heart rate was 59.8 beats per minute. Other EKG findings include: None.  LEG MOVEMENT DATA The total Periodic Limb Movements of Sleep (PLMS) were 0. The PLMS index was 0.0. A PLMS index of <15 is considered normal in adults.  IMPRESSIONS - CPAP was initiated at 5 cm and was titrated to optimal CPAP pressure at 14 cm of water. At 14 cm AHI was 0 and O2  nadir was 94%. - Central sleep apnea was not noted during this titration (CAI = 2.6/h). - Moderate oxygen desaturations to a nadir of 81% at 11 cm of water. - The patient snored with soft snoring volume during this titration study. - No cardiac abnormalities were observed during this study. - Clinically significant periodic limb movements were not noted during this study. Arousals associated with PLMs were  rare.  DIAGNOSIS - Obstructive Sleep Apnea (327.23 [G47.33 ICD-10])  RECOMMENDATIONS - Recommend an initial trial of CPAP therapy with EPR at 14 cm H2O with heated humidification. A Small size [employer] Full Face Mask Simplus mask was used for the titration. - Effort should be made to optimize nasal and oropharyngeal patency. - Avoid alcohol, sedatives and other CNS depressants that may worsen sleep apnea and disrupt normal sleep architecture. - Sleep hygiene should be reviewed to assess factors that may improve sleep quality. - Weight management and regular exercise should be initiated or continued. - Recommend a download be obtained in one month and turn sleep clinic evaluation after 4 weeks of therapy  [Electronically signed] 07/13/2018 11:04 AM  Shelva Majestic MD, [facility], ABSM Diplomate, American Board of Sleep Medicine   NPI: [id number]  [place] PH: [phone]   FX: [phone] [place]

## 2018-07-16 ENCOUNTER — Other Ambulatory Visit: Payer: Self-pay | Admitting: Cardiovascular Disease

## 2018-07-16 ENCOUNTER — Telehealth: Payer: Self-pay | Admitting: *Deleted

## 2018-07-16 DIAGNOSIS — I2510 Atherosclerotic heart disease of native coronary artery without angina pectoris: Secondary | ICD-10-CM

## 2018-07-16 DIAGNOSIS — I10 Essential (primary) hypertension: Secondary | ICD-10-CM

## 2018-07-16 DIAGNOSIS — G4733 Obstructive sleep apnea (adult) (pediatric): Secondary | ICD-10-CM

## 2018-07-16 NOTE — Telephone Encounter (Signed)
-----   Message from Valerie Sine, MD sent at 07/13/2018  1:34 PM EST ----- Mariann Vargas, please notify pt and set up with DME for CPAP set-up

## 2018-07-16 NOTE — Telephone Encounter (Signed)
Patient notified sleep study has been completed. [facility] has orders for pressures settings. Patient already has a CPAP machine.

## 2018-07-19 DIAGNOSIS — J209 Acute bronchitis, unspecified: Secondary | ICD-10-CM | POA: Diagnosis not present

## 2018-07-25 ENCOUNTER — Ambulatory Visit
Admission: RE | Admit: 2018-07-25 | Discharge: 2018-07-25 | Disposition: A | Discharge status: Home / Self Care | Payer: PPO | Source: Ambulatory Visit | Attending: Family Medicine | Admitting: Family Medicine

## 2018-07-25 DIAGNOSIS — Z1231 Encounter for screening mammogram for malignant neoplasm of breast: Secondary | ICD-10-CM | POA: Diagnosis not present

## 2018-08-07 ENCOUNTER — Other Ambulatory Visit: Payer: Self-pay | Admitting: Family Medicine

## 2018-08-07 NOTE — Telephone Encounter (Signed)
Last office visit 02/23/2018 for CPE.   Last refilled 07/25/2017 for #60 with 1 refill.  Next Appt: 02/22/2019 for CPE.

## 2018-08-15 ENCOUNTER — Telehealth: Payer: Self-pay

## 2018-08-15 MED ORDER — NYSTATIN 100000 UNIT/ML MT SUSP: 5.0000 mL | Freq: Four times a day (QID) | OROMUCOSAL | 0 refills | Status: DC

## 2018-08-15 NOTE — Telephone Encounter (Signed)
Ms. Penalver notified that Dr. Lorelei Pont has sent her in a Rx for Nystatin to [facility].

## 2018-08-15 NOTE — Telephone Encounter (Signed)
Patient calls to request a medication for her thrush.  She recently was treated for bronchitis from urgent care with zpack, steroids and nasal sprays.  She has recovered from that illness but has developed thrush/white patches throughout her mouth which is not unusual for her after finishing antibiotics.  Pharmacist suggested a nystatin solution and wonders if that would be appropriate.  If okay, please send in to [facility] for her.  Patient aware Dr. Diona Browner is out of the office and it will likely be tomorrow before she gets a call back.  Will copy Dr. Lorelei Pont on this as well in case he feels comfortable responding in Bedsole's absence.  Thanks.

## 2018-08-15 NOTE — Telephone Encounter (Signed)
I sent this in for her

## 2018-08-22 DIAGNOSIS — E7800 Pure hypercholesterolemia, unspecified: Secondary | ICD-10-CM | POA: Diagnosis not present

## 2018-08-22 DIAGNOSIS — E039 Hypothyroidism, unspecified: Secondary | ICD-10-CM | POA: Diagnosis not present

## 2018-08-22 DIAGNOSIS — E1165 Type 2 diabetes mellitus with hyperglycemia: Secondary | ICD-10-CM | POA: Diagnosis not present

## 2018-08-28 LAB — HM DIABETES EYE EXAM

## 2018-08-29 DIAGNOSIS — E1165 Type 2 diabetes mellitus with hyperglycemia: Secondary | ICD-10-CM | POA: Diagnosis not present

## 2018-08-29 DIAGNOSIS — R7989 Other specified abnormal findings of blood chemistry: Secondary | ICD-10-CM | POA: Diagnosis not present

## 2018-08-29 DIAGNOSIS — E039 Hypothyroidism, unspecified: Secondary | ICD-10-CM | POA: Diagnosis not present

## 2018-08-29 DIAGNOSIS — E7800 Pure hypercholesterolemia, unspecified: Secondary | ICD-10-CM | POA: Diagnosis not present

## 2018-08-29 DIAGNOSIS — R05 Cough: Secondary | ICD-10-CM | POA: Diagnosis not present

## 2018-08-29 DIAGNOSIS — I10 Essential (primary) hypertension: Secondary | ICD-10-CM | POA: Diagnosis not present

## 2018-08-30 ENCOUNTER — Encounter: Payer: Self-pay | Admitting: Family Medicine

## 2018-12-07 ENCOUNTER — Other Ambulatory Visit: Payer: Self-pay | Admitting: Family Medicine

## 2018-12-07 NOTE — Telephone Encounter (Signed)
Last office visit 02/23/2018 for CPE.  Last refilled 08/07/2018 for #60 with 1 refill.  CPE scheduled for 02/22/2019.

## 2018-12-26 DIAGNOSIS — Z79899 Other long term (current) drug therapy: Secondary | ICD-10-CM | POA: Diagnosis not present

## 2018-12-26 DIAGNOSIS — J384 Edema of larynx: Secondary | ICD-10-CM | POA: Diagnosis not present

## 2018-12-26 DIAGNOSIS — R05 Cough: Secondary | ICD-10-CM | POA: Diagnosis not present

## 2018-12-26 DIAGNOSIS — J383 Other diseases of vocal cords: Secondary | ICD-10-CM | POA: Diagnosis not present

## 2019-02-19 ENCOUNTER — Ambulatory Visit (INDEPENDENT_AMBULATORY_CARE_PROVIDER_SITE_OTHER): Payer: PPO

## 2019-02-19 ENCOUNTER — Ambulatory Visit: Payer: PPO

## 2019-02-19 VITALS — BP 115/64 | HR 73 | Temp 97.3°F | Ht 63.0 in | Wt 143.0 lb

## 2019-02-19 DIAGNOSIS — Z0000 Encounter for general adult medical examination without abnormal findings: Secondary | ICD-10-CM | POA: Diagnosis not present

## 2019-02-19 NOTE — Progress Notes (Signed)
Subjective:   Valerie Vargas is a 73 y.o. female who presents for Medicare Annual (Subsequent) preventive examination.  This visit type was conducted due to national recommendations for restrictions regarding the COVID-19 Pandemic (e.g. social distancing). This format is felt to be most appropriate for this patient at this time. All issues noted in this document were discussed and addressed. No physical exam was performed (except for noted visual exam findings with Video Visits). This patient, [name], has given permission to perform this visit via telephone. Vital signs may be absent or patient reported.  Patient location:  At home  Nurse location:  At home     Review of Systems:  n/a Cardiac Risk Factors include: advanced age (>17men, >50 women);diabetes mellitus;hypertension;sedentary lifestyle     Objective:     Vitals: BP 115/64 Comment: per patient  Pulse 73 Comment: per patient  Temp (!) 97.3 F (36.3 C) Comment: per patient  Ht 5\' 3"  (1.6 m) Comment: per patient  Wt 143 lb (64.9 kg) Comment: per patient  SpO2 96% Comment: per patient  BMI 25.33 kg/m   Body mass index is 25.33 kg/m.  Advanced Directives 02/19/2019 07/01/2018 05/01/2018 02/09/2018 02/08/2017 01/14/2016  Does Patient Have a Medical Advance Directive? Yes Yes Yes Yes Yes Yes  Type of Paramedic of [place];Living will [place];Living will [place];Living will [place];Living will [place];Living will [place];Living will  Does patient want to make changes to medical advance directive? - No - Patient declined No - Patient declined - - No - Patient declined  Copy of [place] in Chart? No - copy requested - No - copy requested No - copy requested No - copy requested No - copy requested  Would patient like information on creating a medical advance directive? - No  - Patient declined - - - -    Tobacco Social History   Tobacco Use  Smoking Status Never Smoker  Smokeless Tobacco Never Used     Counseling given: Not Answered   Clinical Intake:  Pre-visit preparation completed: Yes  Pain : No/denies pain     Nutritional Status: BMI 25 -29 Overweight Nutritional Risks: None Diabetes: Yes CBG done?: No Did pt. bring in CBG monitor from home?: No  How often do you need to have someone help you when you read instructions, pamphlets, or other written materials from your doctor or pharmacy?: 1 - Never What is the last grade level you completed in school?: some college  Interpreter Needed?: No  Information entered by :: NAllen LPN  Past Medical History:  Diagnosis Date  . Allergy, unspecified not elsewhere classified   . Chronic interstitial cystitis   . Cough   . Esophageal reflux   . H/O syncope 2007   No recurrence;Tilt table 09/23/05-negative  . Hyperlipidemia 08/22/2014  . Hypertension   . Hypertriglyceridemia   . Irritable bowel syndrome   . Nonocclusive coronary atherosclerosis of native coronary artery   . Obstructive sleep apnea (adult) (pediatric)   . Paroxysmal ventricular tachycardia ([place])   . Type II or unspecified type diabetes mellitus without mention of complication, not stated as uncontrolled   . Unspecified asthma(493.90)   . Unspecified hypothyroidism   . Ventricular tachycardia [facility])    Past Surgical History:  Procedure Laterality Date  . ABDOMINAL HYSTERECTOMY  11/1979   for endometriosis  . CHOLECYSTECTOMY  07/2000  . ESOPHAGOGASTRODUODENOSCOPY ENDOSCOPY  2010   Family History  Problem Relation Age of Onset  . Diabetes Mother   . Breast cancer Mother   . Arthritis Mother   . Diabetes Father   . Stomach cancer Father   . Heart disease Father   . Leukemia Brother   . Breast cancer Maternal Aunt   . Breast cancer Paternal Aunt    Social History   Socioeconomic History  . Marital status: Married     Spouse name: Not on file  . Number of children: Not on file  . Years of education: Not on file  . Highest education level: Not on file  Occupational History  . Occupation: retired  Scientific laboratory technician  . Financial resource strain: Not hard at all  . Food insecurity    Worry: Never true    Inability: Never true  . Transportation needs    Medical: No    Non-medical: No  Tobacco Use  . Smoking status: Never Smoker  . Smokeless tobacco: Never Used  Substance and Sexual Activity  . Alcohol use: No  . Drug use: No  . Sexual activity: Yes  Lifestyle  . Physical activity    Days per week: 0 days    Minutes per session: 0 min  . Stress: Not at all  Relationships  . Social Herbalist on phone: Not on file    Gets together: Not on file    Attends religious service: Not on file    Active member of club or organization: Not on file    Attends meetings of clubs or organizations: Not on file    Relationship status: Not on file  Other Topics Concern  . Not on file  Social History Narrative  . Not on file    Outpatient Encounter Medications as of 02/19/2019  Medication Sig  . ALPRAZolam (XANAX) 0.5 MG tablet TAKE 1 TABLET BY MOUTH EVERY DAY AS NEEDED  . Ascorbic Acid (VITAMIN C) 1000 MG tablet Take 1,000 mg by mouth daily.    . benzonatate (TESSALON) 100 MG capsule TAKE TWO CAPSULES BY MOUTH 3 TIMES A DAYAS NEEDED FOR COUGH  . Cholecalciferol (VITAMIN D3) 1000 UNITS CAPS Take 1 capsule by mouth daily.    . Diphenhydramine-PE-APAP (BENADRYL ALLERGY/SINUS HEADACH) 12.5-5-325 MG TABS Take 0.5 tablets by mouth as needed.   . gabapentin (NEURONTIN) 100 MG tablet Take 300 mg by mouth 3 (three) times daily.    Marland Kitchen glucose blood (ONE TOUCH ULTRA TEST) test strip -TEST TWICE DAILY  . Guaifenesin 1200 MG TB12 Take by mouth as needed.   Marland Kitchen L-Theanine 100 MG CAPS Take 2 tablets by mouth as needed.   Marland Kitchen levothyroxine (SYNTHROID, LEVOTHROID) 75 MCG tablet Take 75 mcg by mouth daily before breakfast.   . loratadine (CLARITIN) 10 MG tablet Take 10 mg by mouth daily.  Marland Kitchen MAGNESIUM GLYCINATE PLUS PO Take 1 capsule by mouth daily.   . meloxicam (MOBIC) 15 MG tablet Take 1 tablet (15 mg total) by mouth daily.  . metFORMIN (GLUCOPHAGE-XR) 500 MG 24 hr tablet   . metoprolol succinate (TOPROL-XL) 50 MG 24 hr tablet TAKE 1 TABLET BY MOUTH EVERY DAY (TAKE WITH OR IMMEDIATELY FOLLOWING A MEAL)  . metoprolol succinate (TOPROL-XL) 50 MG 24 hr tablet TAKE 1 TABLET BY MOUTH EACH DAY  . nystatin (MYCOSTATIN) 100000 UNIT/ML suspension Take 5 mLs (500,000 Units total) by mouth 4 (four) times daily. Continue 3 days after thrush appears resolved  . OMEGA-3 1000 MG CAPS Take  1,000 mg by mouth daily.    Glory Rosebush DELICA LANCETS 57Q MISC -TEST TWICE DAILY  . promethazine-dextromethorphan (PROMETHAZINE-DM) 6.25-15 MG/5ML syrup Take 5 mLs by mouth 4 (four) times daily as needed for cough.  . Pyridoxine HCl (VITAMIN B-6 PO) Take 1 tablet by mouth daily.  . rosuvastatin (CRESTOR) 10 MG tablet TAKE 1 TABLET BY MOUTH EVERY DAY FOR CHOLESTEROL  . scopolamine (TRANSDERM-SCOP, 1.5 MG,) 1 MG/3DAYS Place 1 patch (1.5 mg total) onto the skin every 3 (three) days.  . Thiamine HCl (VITAMIN B-1) 250 MG tablet Take 250 mg by mouth daily.    . VENTOLIN HFA 108 (90 Base) MCG/ACT inhaler USE 2 PUFFS BY MOUTH EVERY 6 HOURS AS NEEDED FOR WHEEZING FOR SHORTNESS OF BREATH  . vitamin B-12 (CYANOCOBALAMIN) 1000 MCG tablet Take 1,000 mcg by mouth daily.     No facility-administered encounter medications on file as of 02/19/2019.     Activities of Daily Living In your present state of health, do you have any difficulty performing the following activities: 02/19/2019  Hearing? Y  Vision? N  Difficulty concentrating or making decisions? N  Walking or climbing stairs? Y  Comment a little with back  Dressing or bathing? N  Doing errands, shopping? N  Preparing Food and eating ? N  Using the Toilet? N  In the past six months, have you  accidently leaked urine? Y  Do you have problems with loss of bowel control? N  Managing your Medications? N  Managing your Finances? N  Housekeeping or managing your Housekeeping? N  Some recent data might be hidden    Patient Care Team: Jinny Sanders, MD as PCP - General (Family Medicine) Jacelyn Pi, MD as Attending Physician (Endocrinology) Ernestine Conrad, MD as Referring Physician (Otolaryngology) Sanda Klein, MD as Consulting Physician (Cardiology) Jola Schmidt, MD as Consulting Physician (Ophthalmology) Levin Erp, DDS, PA as Referring Physician (Dentistry)    Assessment:   This is a routine wellness examination for Valerie Vargas.  Exercise Activities and Dietary recommendations Current Exercise Habits: The patient does not participate in regular exercise at present  Goals    . Increase water intake (pt-stated)     Starting 02/08/2017, I will attempt to drink at least 6 glasses of water daily.    . Patient Stated     Starting 02/09/2018, I will continue to walk 6000-10000 steps daily.     . Patient Stated     Wants to get more limber and get down to 130 pounds       Fall Risk Fall Risk  02/19/2019 02/09/2018 02/08/2017 01/14/2016 08/15/2013  Falls in the past year? 0 No No No No  Risk for fall due to : Medication side effect - - - -  Follow up Falls evaluation completed;Falls prevention discussed - - - -   Is the patient's home free of loose throw rugs in walkways, pet beds, electrical cords, etc?   yes      Grab bars in the bathroom? no      Handrails on the stairs?   n/a      Adequate lighting?   yes  Timed Get Up and Go performed: n/a  Depression Screen PHQ 2/9 Scores 02/19/2019 02/09/2018 02/08/2017 01/14/2016  PHQ - 2 Score 0 0 0 0  PHQ- 9 Score 6 0 - -     Cognitive Function MMSE - Mini Mental State Exam 02/19/2019 02/09/2018 02/08/2017 01/14/2016  Orientation to time 5 5 5 5   Orientation to  Place 5 5 5 5   Registration 3 3 3 3   Attention/  Calculation 5 0 0 0  Recall 3 3 2 3   Language- name 2 objects 0 0 0 0  Language- repeat 1 1 1 1   Language- follow 3 step command 0 3 3 3   Language- read & follow direction 0 0 0 0  Write a sentence 0 0 0 0  Copy design 0 0 0 0  Total score 22 20 19 20    Mini Cog  Mini-Cog screen was completed. Maximum score is 22. A value of 0 denotes this part of the MMSE was not completed or the patient failed this part of the Mini-Cog screening.       Immunization History  Administered Date(s) Administered  . Influenza Split 05/15/2013  . Influenza, High Dose Seasonal PF 05/09/2017, 04/25/2018  . Influenza-Unspecified 05/01/2014, 06/03/2015, 04/19/2016  . Pneumococcal Conjugate-13 08/15/2012  . Pneumococcal Polysaccharide-23 11/06/2014  . Tdap 06/03/2015  . Zoster 03/06/2013    Qualifies for Shingles Vaccine? yes  Screening Tests Health Maintenance  Topic Date Due  . HEMOGLOBIN A1C  08/12/2018  . URINE MICROALBUMIN  02/10/2019  . INFLUENZA VACCINE  02/16/2019  . FOOT EXAM  02/24/2019  . MAMMOGRAM  07/26/2019  . OPHTHALMOLOGY EXAM  08/29/2019  . COLONOSCOPY  03/16/2023  . TETANUS/TDAP  06/02/2025  . DEXA SCAN  Completed  . Hepatitis C Screening  Completed  . PNA vac Low Risk Adult  Completed    Cancer Screenings: Lung: Low Dose CT Chest recommended if Age 72-80 years, 30 pack-year currently smoking OR have quit w/in 15years. Patient does not qualify. Breast:  Up to date on Mammogram? Yes   Up to date of Bone Density/Dexa? Yes Colorectal: up to date  Additional Screenings: : Hepatitis C Screening: 12/2015     Plan:   Patient want to become more limber and get down to 130 pounds  I have personally reviewed and noted the following in the patient's chart:   . Medical and social history . Use of alcohol, tobacco or illicit drugs  . Current medications and supplements . Functional ability and status . Nutritional status . Physical activity . Advanced directives . List of  other physicians . Hospitalizations, surgeries, and ER visits in previous 12 months . Vitals . Screenings to include cognitive, depression, and falls . Referrals and appointments  In addition, I have reviewed and discussed with patient certain preventive protocols, quality metrics, and best practice recommendations. A written personalized care plan for preventive services as well as general preventive health recommendations were provided to patient.     Kellie Simmering, LPN  07/23/1094

## 2019-02-19 NOTE — Progress Notes (Signed)
PCP notes:  Health Maintenance:  A1c and micro albumin are due.  Abnormal Screenings:  Depression screen 6 (having issues with sleep not depression)  Patient concerns:  Wants to take about CPAP machine and if you would start prescribing her gabapentin per ENT.  Nurse concerns:  None  Next PCP appt.: 02/22/2019 at 11:20

## 2019-02-19 NOTE — Patient Instructions (Signed)
Valerie Vargas , Thank you for taking time to come for your Medicare Wellness Visit. I appreciate your ongoing commitment to your health goals. Please review the following plan we discussed and let me know if I can assist you in the future.   Screening recommendations/referrals: Colonoscopy: 02/2013 Mammogram: 07/2018 Bone Density: 04/2017 Recommended yearly ophthalmology/optometry visit for glaucoma screening and checkup Recommended yearly dental visit for hygiene and checkup  Vaccinations: Influenza vaccine: 04/2018 Pneumococcal vaccine: 10/2014 Tdap vaccine: 05/2015 Shingles vaccine: discussed    Advanced directives: Please bring a copy of your POA (Power of [name]) and/or Living Will to your next appointment.    Conditions/risks identified: overweight  Next appointment: 02/22/2019 at 11:20   Preventive Care 75 Years and Older, Female Preventive care refers to lifestyle choices and visits with your health care provider that can promote health and wellness. What does preventive care include?  A yearly physical exam. This is also called an annual well check.  Dental exams once or twice a year.  Routine eye exams. Ask your health care provider how often you should have your eyes checked.  Personal lifestyle choices, including:  Daily care of your teeth and gums.  Regular physical activity.  Eating a healthy diet.  Avoiding tobacco and drug use.  Limiting alcohol use.  Practicing safe sex.  Taking low-dose aspirin every day.  Taking vitamin and mineral supplements as recommended by your health care provider. What happens during an annual well check? The services and screenings done by your health care provider during your annual well check will depend on your age, overall health, lifestyle risk factors, and family history of disease. Counseling  Your health care provider may ask you questions about your:  Alcohol use.  Tobacco use.  Drug use.  Emotional  well-being.  Home and relationship well-being.  Sexual activity.  Eating habits.  History of falls.  Memory and ability to understand (cognition).  Work and work Statistician.  Reproductive health. Screening  You may have the following tests or measurements:  Height, weight, and BMI.  Blood pressure.  Lipid and cholesterol levels. These may be checked every 5 years, or more frequently if you are over 64 years old.  Skin check.  Lung cancer screening. You may have this screening every year starting at age 15 if you have a 30-pack-year history of smoking and currently smoke or have quit within the past 15 years.  Fecal occult blood test (FOBT) of the stool. You may have this test every year starting at age 75.  Flexible sigmoidoscopy or colonoscopy. You may have a sigmoidoscopy every 5 years or a colonoscopy every 10 years starting at age 75.  Hepatitis C blood test.  Hepatitis B blood test.  Sexually transmitted disease (STD) testing.  Diabetes screening. This is done by checking your blood sugar (glucose) after you have not eaten for a while (fasting). You may have this done every 1-3 years.  Bone density scan. This is done to screen for osteoporosis. You may have this done starting at age 51.  Mammogram. This may be done every 1-2 years. Talk to your health care provider about how often you should have regular mammograms. Talk with your health care provider about your test results, treatment options, and if necessary, the need for more tests. Vaccines  Your health care provider may recommend certain vaccines, such as:  Influenza vaccine. This is recommended every year.  Tetanus, diphtheria, and acellular pertussis (Tdap, Td) vaccine. You may need a Td booster  every 10 years.  Zoster vaccine. You may need this after age 80.  Pneumococcal 13-valent conjugate (PCV13) vaccine. One dose is recommended after age 88.  Pneumococcal polysaccharide (PPSV23) vaccine. One  dose is recommended after age 62. Talk to your health care provider about which screenings and vaccines you need and how often you need them. This information is not intended to replace advice given to you by your health care provider. Make sure you discuss any questions you have with your health care provider. Document Released: 07/31/2015 Document Revised: 03/23/2016 Document Reviewed: 05/05/2015 Elsevier Interactive Patient Education  2017 [place] Prevention in the Home Falls can cause injuries. They can happen to people of all ages. There are many things you can do to make your home safe and to help prevent falls. What can I do on the outside of my home?  Regularly fix the edges of walkways and driveways and fix any cracks.  Remove anything that might make you trip as you walk through a door, such as a raised step or threshold.  Trim any bushes or trees on the path to your home.  Use bright outdoor lighting.  Clear any walking paths of anything that might make someone trip, such as rocks or tools.  Regularly check to see if handrails are loose or broken. Make sure that both sides of any steps have handrails.  Any raised decks and porches should have guardrails on the edges.  Have any leaves, snow, or ice cleared regularly.  Use sand or salt on walking paths during winter.  Clean up any spills in your garage right away. This includes oil or grease spills. What can I do in the bathroom?  Use night lights.  Install grab bars by the toilet and in the tub and shower. Do not use towel bars as grab bars.  Use non-skid mats or decals in the tub or shower.  If you need to sit down in the shower, use a plastic, non-slip stool.  Keep the floor dry. Clean up any water that spills on the floor as soon as it happens.  Remove soap buildup in the tub or shower regularly.  Attach bath mats securely with double-sided non-slip rug tape.  Do not have throw rugs and other  things on the floor that can make you trip. What can I do in the bedroom?  Use night lights.  Make sure that you have a light by your bed that is easy to reach.  Do not use any sheets or blankets that are too big for your bed. They should not hang down onto the floor.  Have a firm chair that has side arms. You can use this for support while you get dressed.  Do not have throw rugs and other things on the floor that can make you trip. What can I do in the kitchen?  Clean up any spills right away.  Avoid walking on wet floors.  Keep items that you use a lot in easy-to-reach places.  If you need to reach something above you, use a strong step stool that has a grab bar.  Keep electrical cords out of the way.  Do not use floor polish or wax that makes floors slippery. If you must use wax, use non-skid floor wax.  Do not have throw rugs and other things on the floor that can make you trip. What can I do with my stairs?  Do not leave any items on the stairs.  Make  sure that there are handrails on both sides of the stairs and use them. Fix handrails that are broken or loose. Make sure that handrails are as long as the stairways.  Check any carpeting to make sure that it is firmly attached to the stairs. Fix any carpet that is loose or worn.  Avoid having throw rugs at the top or bottom of the stairs. If you do have throw rugs, attach them to the floor with carpet tape.  Make sure that you have a light switch at the top of the stairs and the bottom of the stairs. If you do not have them, ask someone to add them for you. What else can I do to help prevent falls?  Wear shoes that:  Do not have high heels.  Have rubber bottoms.  Are comfortable and fit you well.  Are closed at the toe. Do not wear sandals.  If you use a stepladder:  Make sure that it is fully opened. Do not climb a closed stepladder.  Make sure that both sides of the stepladder are locked into place.  Ask  someone to hold it for you, if possible.  Clearly mark and make sure that you can see:  Any grab bars or handrails.  First and last steps.  Where the edge of each step is.  Use tools that help you move around (mobility aids) if they are needed. These include:  Canes.  Walkers.  Scooters.  Crutches.  Turn on the lights when you go into a dark area. Replace any light bulbs as soon as they burn out.  Set up your furniture so you have a clear path. Avoid moving your furniture around.  If any of your floors are uneven, fix them.  If there are any pets around you, be aware of where they are.  Review your medicines with your doctor. Some medicines can make you feel dizzy. This can increase your chance of falling. Ask your doctor what other things that you can do to help prevent falls. This information is not intended to replace advice given to you by your health care provider. Make sure you discuss any questions you have with your health care provider. Document Released: 04/30/2009 Document Revised: 12/10/2015 Document Reviewed: 08/08/2014 Elsevier Interactive Patient Education  2017 [employer].

## 2019-02-19 NOTE — Progress Notes (Signed)
I reviewed health advisor's note, was available for consultation, and agree with documentation and plan.  

## 2019-02-20 ENCOUNTER — Encounter: Payer: Self-pay | Admitting: Cardiovascular Disease

## 2019-02-20 DIAGNOSIS — E1165 Type 2 diabetes mellitus with hyperglycemia: Secondary | ICD-10-CM | POA: Diagnosis not present

## 2019-02-20 DIAGNOSIS — E7800 Pure hypercholesterolemia, unspecified: Secondary | ICD-10-CM | POA: Diagnosis not present

## 2019-02-20 LAB — HEMOGLOBIN A1C: Hemoglobin A1C: 6.3

## 2019-02-20 LAB — MICROALBUMIN, URINE: Microalb, Ur: <7

## 2019-02-22 ENCOUNTER — Ambulatory Visit (INDEPENDENT_AMBULATORY_CARE_PROVIDER_SITE_OTHER): Payer: PPO | Admitting: Family Medicine

## 2019-02-22 ENCOUNTER — Encounter: Payer: Self-pay | Admitting: Family Medicine

## 2019-02-22 VITALS — Ht 63.0 in

## 2019-02-22 DIAGNOSIS — G4733 Obstructive sleep apnea (adult) (pediatric): Secondary | ICD-10-CM

## 2019-02-22 DIAGNOSIS — E119 Type 2 diabetes mellitus without complications: Secondary | ICD-10-CM

## 2019-02-22 DIAGNOSIS — I10 Essential (primary) hypertension: Secondary | ICD-10-CM | POA: Diagnosis not present

## 2019-02-22 DIAGNOSIS — Z0000 Encounter for general adult medical examination without abnormal findings: Secondary | ICD-10-CM

## 2019-02-22 DIAGNOSIS — E7800 Pure hypercholesterolemia, unspecified: Secondary | ICD-10-CM

## 2019-02-22 NOTE — Progress Notes (Signed)
VIRTUAL VISIT Due to national recommendations of social distancing due to [place] 19, a virtual visit is felt to be most appropriate for this patient at this time.   I connected with the patient on 02/22/19 at 11:20 AM EDT by virtual telehealth platform and verified that I am speaking with the correct person using two identifiers.   I discussed the limitations, risks, security and privacy concerns of performing an evaluation and management service by  virtual telehealth platform and the availability of in person appointments. I also discussed with the patient that there may be a patient responsible charge related to this service. The patient expressed understanding and agreed to proceed.  Patient location: Home Provider Location: [facility] Participants: Eliezer Lofts and Rebekah Chesterfield   Chief Complaint  Patient presents with  . Annual Exam    Part 2    History of Present Illness: The patient presents for  complete physical and review of chronic health problems. He/She also has the following acute concerns today: Cardiologist tested and found sleep apnea.. using CPAP.  The patient saw LPN for medicare wellness. Note reviewed in detail and important notes copied below.  Health Maintenance: A1c and micro albumin are due. Abnormal Screenings: Depression screen 6 (having issues with sleep not depression) Patient concerns: Wants to talk about CPAP machine and if you would start prescribing her gabapentin per ENT.  Today:  Hypertension:  Good control on metoprolol.  BP Readings from Last 3 Encounters:  02/19/19 115/64  02/23/18 124/64  02/09/18 128/74  Using medication without problems or lightheadedness: none Chest pain with exertion:none Edema:none Short of breath:none Average home BPs: Other issues:  Diabetes: Previously controlled... labs at Dr. Almetta Lovely  control on metformin.  microalbumin pending Lab Results  Component Value Date   HGBA1C 6.2 02/09/2018   Using medications without difficulties: Hypoglycemic episodes:none Hyperglycemic episodes:none Feet problems: no uclers Blood Sugars averaging: FBS 133 eye exam within last year: yes  Elevated Cholesterol:At goal on crestor.. will get records from ENDO Using medications without problems: Muscle aches:  Diet compliance:none Exercise:walking off and on.. using fit bit Other complaints:    COVID 19 screen No recent travel or known exposure to [place] The patient denies respiratory symptoms of COVID 19 at this time.  The importance of social distancing was discussed today.   Review of Systems  Constitutional: Negative for chills and fever.  HENT: Negative for congestion and ear pain.   Eyes: Negative for pain and redness.  Respiratory: Negative for cough and shortness of breath.   Cardiovascular: Negative for chest pain, palpitations and leg swelling.  Gastrointestinal: Negative for abdominal pain, blood in stool, constipation, diarrhea, nausea and vomiting.  Genitourinary: Negative for dysuria.  Musculoskeletal: Negative for falls and myalgias.  Skin: Negative for rash.  Neurological: Negative for dizziness.  Psychiatric/Behavioral: Negative for depression. The patient is not nervous/anxious.       Past Medical History:  Diagnosis Date  . Allergy, unspecified not elsewhere classified   . Chronic interstitial cystitis   . Cough   . Esophageal reflux   . H/O syncope 2007   No recurrence;Tilt table 09/23/05-negative  . Hyperlipidemia 08/22/2014  . Hypertension   . Hypertriglyceridemia   . Irritable bowel syndrome   . Nonocclusive coronary atherosclerosis of native coronary artery   . Obstructive sleep apnea (adult) (pediatric)   . Paroxysmal ventricular tachycardia ([place])   . Type II or unspecified type diabetes mellitus without mention of complication, not stated as uncontrolled   .  Unspecified asthma(493.90)   . Unspecified hypothyroidism   . Ventricular tachycardia ([place])      reports that she has never smoked. She has never used smokeless tobacco. She reports that she does not drink alcohol or use drugs.   Current Outpatient Medications:  .  ALPRAZolam (XANAX) 0.5 MG tablet, TAKE 1 TABLET BY MOUTH EVERY DAY AS NEEDED, Disp: 10 tablet, Rfl: 0 .  Ascorbic Acid (VITAMIN C) 1000 MG tablet, Take 1,000 mg by mouth daily.  , Disp: , Rfl:  .  benzonatate (TESSALON) 100 MG capsule, TAKE TWO CAPSULES BY MOUTH 3 TIMES A DAYAS NEEDED FOR COUGH, Disp: 60 capsule, Rfl: 1 .  Cholecalciferol (VITAMIN D3) 1000 UNITS CAPS, Take 1 capsule by mouth daily.  , Disp: , Rfl:  .  Diphenhydramine-PE-APAP (BENADRYL ALLERGY/SINUS HEADACH) 12.5-5-325 MG TABS, Take 0.5 tablets by mouth as needed. , Disp: , Rfl:  .  gabapentin (NEURONTIN) 100 MG tablet, Take 300 mg by mouth 3 (three) times daily.  , Disp: , Rfl:  .  glucose blood (ONE TOUCH ULTRA TEST) test strip, -TEST TWICE DAILY, Disp: 100 each, Rfl: 6 .  Guaifenesin 1200 MG TB12, Take by mouth as needed. , Disp: , Rfl:  .  L-Theanine 100 MG CAPS, Take 2 tablets by mouth as needed. , Disp: , Rfl:  .  levothyroxine (SYNTHROID, LEVOTHROID) 75 MCG tablet, Take 75 mcg by mouth daily before breakfast., Disp: , Rfl:  .  loratadine (CLARITIN) 10 MG tablet, Take 10 mg by mouth daily., Disp: , Rfl:  .  MAGNESIUM GLYCINATE PLUS PO, Take 1 capsule by mouth daily. , Disp: , Rfl:  .  meloxicam (MOBIC) 15 MG tablet, Take 1 tablet (15 mg total) by mouth daily., Disp: 30 tablet, Rfl: 0 .  metFORMIN (GLUCOPHAGE-XR) 500 MG 24 hr tablet, , Disp: , Rfl:  .  metoprolol succinate (TOPROL-XL) 50 MG 24 hr tablet, TAKE 1 TABLET BY MOUTH EVERY DAY (TAKE WITH OR IMMEDIATELY FOLLOWING A MEAL), Disp: 30 tablet, Rfl: 8 .  metoprolol succinate (TOPROL-XL) 50 MG 24 hr tablet, TAKE 1 TABLET BY MOUTH EACH DAY, Disp: 30 tablet, Rfl: 11 .  nystatin (MYCOSTATIN) 100000 UNIT/ML suspension, Take 5 mLs (500,000 Units total) by mouth 4 (four) times daily. Continue 3 days after  thrush appears resolved, Disp: 473 mL, Rfl: 0 .  OMEGA-3 1000 MG CAPS, Take 1,000 mg by mouth daily.  , Disp: , Rfl:  .  ONETOUCH DELICA LANCETS 60F MISC, -TEST TWICE DAILY, Disp: 100 each, Rfl: 5 .  promethazine-dextromethorphan (PROMETHAZINE-DM) 6.25-15 MG/5ML syrup, Take 5 mLs by mouth 4 (four) times daily as needed for cough., Disp: 180 mL, Rfl: 0 .  Pyridoxine HCl (VITAMIN B-6 PO), Take 1 tablet by mouth daily., Disp: , Rfl:  .  rosuvastatin (CRESTOR) 10 MG tablet, TAKE 1 TABLET BY MOUTH EVERY DAY FOR CHOLESTEROL, Disp: 90 tablet, Rfl: 3 .  scopolamine (TRANSDERM-SCOP, 1.5 MG,) 1 MG/3DAYS, Place 1 patch (1.5 mg total) onto the skin every 3 (three) days., Disp: 4 patch, Rfl: 0 .  Thiamine HCl (VITAMIN B-1) 250 MG tablet, Take 250 mg by mouth daily.  , Disp: , Rfl:  .  VENTOLIN HFA 108 (90 Base) MCG/ACT inhaler, USE 2 PUFFS BY MOUTH EVERY 6 HOURS AS NEEDED FOR WHEEZING FOR SHORTNESS OF BREATH, Disp: 18 g, Rfl: 2 .  vitamin B-12 (CYANOCOBALAMIN) 1000 MCG tablet, Take 1,000 mcg by mouth daily.  , Disp: , Rfl:    Observations/Objective: Height 5\' 3"  (1.6  m).  Physical Exam  Physical Exam Constitutional:      General: The patient is not in acute distress. Pulmonary:     Effort: Pulmonary effort is normal. No respiratory distress.  Neurological:     Mental Status: The patient is alert and oriented to person, place, and time.  Psychiatric:        Mood and Affect: Mood normal.        Behavior: Behavior normal.   Assessment and Plan   The patient's preventative maintenance and recommended screening tests for an annual wellness exam were reviewed in full today. Brought up to date unless services declined.  Counselled on the importance of diet, exercise, and its role in overall health and mortality. The patient's FH and SH was reviewed, including their home life, tobacco status, and drug and alcohol status.   Colon: 03/15/2013 nml.. Due for 10 year repeat in 2024. Nonsmoker DEXA: Normal  04/2017, can repeat in 5 years. Mammogram last 07/2018 PAP not indicated, DVE not indicated total hysterecotmy Vaccine: uptodate  Hep C neg  OBSTRUCTIVE SLEEP APNEA Now on CPAP.Marland Kitchen needs titration.  HTN (hypertension) Well controlled. Continue current medication.   Diabetes mellitus with no complication  Per Dr. Chalmers Cater Lab Results  Component Value Date   HGBA1C 6.3 02/20/2019     High cholesterol :At goal on crestor.    I discussed the assessment and treatment plan with the patient. The patient was provided an opportunity to ask questions and all were answered. The patient agreed with the plan and demonstrated an understanding of the instructions.   The patient was advised to call back or seek an in-person evaluation if the symptoms worsen or if the condition fails to improve as anticipated.     Eliezer Lofts, MD

## 2019-02-22 NOTE — Assessment & Plan Note (Signed)
Now on CPAP.Marland Kitchen needs titration.

## 2019-02-26 ENCOUNTER — Other Ambulatory Visit: Payer: Self-pay | Admitting: Family Medicine

## 2019-02-26 NOTE — Telephone Encounter (Signed)
CPE on 02/22/2019 but no labs.  Last Lipid 02/09/2018.  Refill?

## 2019-02-26 NOTE — Telephone Encounter (Signed)
She has labs at ENDO.. stated they should be on the way.

## 2019-02-27 DIAGNOSIS — R7989 Other specified abnormal findings of blood chemistry: Secondary | ICD-10-CM | POA: Diagnosis not present

## 2019-02-27 DIAGNOSIS — E1165 Type 2 diabetes mellitus with hyperglycemia: Secondary | ICD-10-CM | POA: Diagnosis not present

## 2019-02-27 DIAGNOSIS — E039 Hypothyroidism, unspecified: Secondary | ICD-10-CM | POA: Diagnosis not present

## 2019-02-27 DIAGNOSIS — I10 Essential (primary) hypertension: Secondary | ICD-10-CM | POA: Diagnosis not present

## 2019-02-27 DIAGNOSIS — E7800 Pure hypercholesterolemia, unspecified: Secondary | ICD-10-CM | POA: Diagnosis not present

## 2019-03-01 ENCOUNTER — Encounter: Payer: Self-pay | Admitting: Family Medicine

## 2019-03-07 ENCOUNTER — Telehealth: Payer: Self-pay | Admitting: Family Medicine

## 2019-03-07 NOTE — Telephone Encounter (Signed)
Left message asking pt to call office please schedule medicare wellness with nurse and cpx with dr Diona Browner

## 2019-03-07 NOTE — Progress Notes (Signed)
Left message  Asking pt to call office

## 2019-03-22 ENCOUNTER — Encounter: Payer: Self-pay | Admitting: Cardiovascular Disease

## 2019-03-23 NOTE — Assessment & Plan Note (Signed)
Per Dr. Chalmers Cater Lab Results  Component Value Date   HGBA1C 6.3 02/20/2019

## 2019-03-23 NOTE — Assessment & Plan Note (Signed)
At goal on crestor. 

## 2019-03-23 NOTE — Assessment & Plan Note (Signed)
Well controlled. Continue current medication.  

## 2019-03-26 ENCOUNTER — Other Ambulatory Visit: Payer: Self-pay

## 2019-03-26 ENCOUNTER — Encounter: Payer: Self-pay | Admitting: Cardiovascular Disease

## 2019-03-26 ENCOUNTER — Ambulatory Visit: Payer: PPO | Admitting: Cardiovascular Disease

## 2019-03-26 VITALS — BP 162/84 | HR 84 | Temp 97.2°F | Ht 63.0 in | Wt 147.0 lb

## 2019-03-26 DIAGNOSIS — G4733 Obstructive sleep apnea (adult) (pediatric): Secondary | ICD-10-CM | POA: Diagnosis not present

## 2019-03-26 DIAGNOSIS — E782 Mixed hyperlipidemia: Secondary | ICD-10-CM | POA: Diagnosis not present

## 2019-03-26 DIAGNOSIS — E119 Type 2 diabetes mellitus without complications: Secondary | ICD-10-CM | POA: Diagnosis not present

## 2019-03-26 DIAGNOSIS — I10 Essential (primary) hypertension: Secondary | ICD-10-CM | POA: Diagnosis not present

## 2019-03-26 NOTE — Patient Instructions (Addendum)

## 2019-03-26 NOTE — Progress Notes (Signed)
Patient ID: Valerie Vargas, female   DOB: October 23, 1945, 73 y.o.   MRN: [id number]    Cardiology Office Note    Date:  03/31/2019   ID:  Khushbu, Bowlds 1946/04/18, MRN [id number]  PCP:  Jinny Sanders, MD  Cardiologist:   Sanda Klein, MD   Chief Complaint  Patient presents with  . Sleep Apnea  . Hypertension    History of Present Illness:  Valerie Vargas is a 73 y.o. female with OSA, a remote history of syncope in 2007 and negative subsequent workup, minor coronary artery disease by angiography in 2003, hypertension, hyperlipidemia, type 2 diabetes mellitus.  A repeat sleep study confirmed that she still has obstructive sleep apnea.  She is started using CPAP again but finds it very difficult to wear it all night long.  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  She had an endoscopy performed by Dr. Annie Main) [facility]).  On the other hand, she has noticed improvement in her daytime hypersomnolence.  She no longer needs to take daytime naps and has more energy.  She is wearing a nighttime pulse oximeter that grabs her oxygen levels and she has fewer episodes of desaturation.  She has good glycemic control and her most recent LDL cholesterol was 56, slightly elevated triglycerides at 191.  (Labs performed February 25, 2019)  Frequently her blood pressure is elevated in the doctor's office, but at home she reports her blood pressure is 120 over 70s consistently.  She sleeps well and denies daytime hypersomnolence.    The patient specifically denies any chest pain at rest exertion, dyspnea at rest or with exertion, orthopnea, paroxysmal nocturnal dyspnea, syncope, palpitations, focal neurological deficits, intermittent claudication, lower extremity edema, unexplained weight gain, cough, hemoptysis or wheezing.  In the past a tilt table test demonstrated that she had orthostatic hypotension. Normal Vascuscreen (minimal carotid plaque, normal ABI, no AAA) in  May 2018.    Past Medical History:  Diagnosis Date  . Allergy, unspecified not elsewhere classified   . Chronic interstitial cystitis   . Cough   . Esophageal reflux   . H/O syncope 2007   No recurrence;Tilt table 09/23/05-negative  . Hyperlipidemia 08/22/2014  . Hypertension   . Hypertriglyceridemia   . Irritable bowel syndrome   . Nonocclusive coronary atherosclerosis of native coronary artery   . Obstructive sleep apnea (adult) (pediatric)   . Paroxysmal ventricular tachycardia ([place])   . Type II or unspecified type diabetes mellitus without mention of complication, not stated as uncontrolled   . Unspecified asthma(493.90)   . Unspecified hypothyroidism   . Ventricular tachycardia [facility])     Past Surgical History:  Procedure Laterality Date  . ABDOMINAL HYSTERECTOMY  11/1979   for endometriosis  . CHOLECYSTECTOMY  07/2000  . ESOPHAGOGASTRODUODENOSCOPY ENDOSCOPY  2010    Outpatient Medications Prior to Visit  Medication Sig Dispense Refill  . ALPRAZolam (XANAX) 0.5 MG tablet TAKE 1 TABLET BY MOUTH EVERY DAY AS NEEDED 10 tablet 0  . Ascorbic Acid (VITAMIN C) 1000 MG tablet Take 1,000 mg by mouth daily.      . benzonatate (TESSALON) 100 MG capsule TAKE TWO CAPSULES BY MOUTH 3 TIMES A DAYAS NEEDED FOR COUGH 60 capsule 1  . Cholecalciferol (VITAMIN D3) 1000 UNITS CAPS Take 1 capsule by mouth daily.      . Diphenhydramine-PE-APAP (BENADRYL ALLERGY/SINUS HEADACH) 12.5-5-325 MG TABS Take 0.5 tablets by mouth as needed.     . gabapentin (  NEURONTIN) 100 MG tablet Take 300 mg by mouth 3 (three) times daily.      Marland Kitchen glucose blood (ONE TOUCH ULTRA TEST) test strip -TEST TWICE DAILY 100 each 6  . Guaifenesin 1200 MG TB12 Take by mouth as needed.     Marland Kitchen L-Theanine 100 MG CAPS Take 2 tablets by mouth as needed.     Marland Kitchen levothyroxine (SYNTHROID, LEVOTHROID) 75 MCG tablet Take 75 mcg by mouth daily before breakfast.    . loratadine (CLARITIN) 10 MG tablet Take 10 mg by mouth daily.    Marland Kitchen MAGNESIUM  GLYCINATE PLUS PO Take 1 capsule by mouth daily.     . meloxicam (MOBIC) 15 MG tablet Take 1 tablet (15 mg total) by mouth daily. 30 tablet 0  . metFORMIN (GLUCOPHAGE-XR) 500 MG 24 hr tablet     . metoprolol succinate (TOPROL-XL) 50 MG 24 hr tablet TAKE 1 TABLET BY MOUTH EVERY DAY (TAKE WITH OR IMMEDIATELY FOLLOWING A MEAL) 30 tablet 8  . nystatin (MYCOSTATIN) 100000 UNIT/ML suspension Take 5 mLs (500,000 Units total) by mouth 4 (four) times daily. Continue 3 days after thrush appears resolved 473 mL 0  . OMEGA-3 1000 MG CAPS Take 1,000 mg by mouth daily.      Glory Rosebush DELICA LANCETS 99991111 MISC -TEST TWICE DAILY 100 each 5  . promethazine-dextromethorphan (PROMETHAZINE-DM) 6.25-15 MG/5ML syrup Take 5 mLs by mouth 4 (four) times daily as needed for cough. 180 mL 0  . Pyridoxine HCl (VITAMIN B-6 PO) Take 1 tablet by mouth daily.    . rosuvastatin (CRESTOR) 10 MG tablet TAKE 1 TABLET BY MOUTH EVERY DAY FOR CHOLESTEROL 90 tablet 3  . scopolamine (TRANSDERM-SCOP, 1.5 MG,) 1 MG/3DAYS Place 1 patch (1.5 mg total) onto the skin every 3 (three) days. 4 patch 0  . Thiamine HCl (VITAMIN B-1) 250 MG tablet Take 250 mg by mouth daily.      . VENTOLIN HFA 108 (90 Base) MCG/ACT inhaler USE 2 PUFFS BY MOUTH EVERY 6 HOURS AS NEEDED FOR WHEEZING FOR SHORTNESS OF BREATH 18 g 2  . vitamin B-12 (CYANOCOBALAMIN) 1000 MCG tablet Take 1,000 mcg by mouth daily.      . metoprolol succinate (TOPROL-XL) 50 MG 24 hr tablet TAKE 1 TABLET BY MOUTH EACH DAY 30 tablet 11   No facility-administered medications prior to visit.      Allergies:   Hctz [hydrochlorothiazide], Penicillins, and Sulfa antibiotics   Social History   Socioeconomic History  . Marital status: Married    Spouse name: Not on file  . Number of children: Not on file  . Years of education: Not on file  . Highest education level: Not on file  Occupational History  . Occupation: retired  Scientific laboratory technician  . Financial resource strain: Not hard at all  .  Food insecurity    Worry: Never true    Inability: Never true  . Transportation needs    Medical: No    Non-medical: No  Tobacco Use  . Smoking status: Never Smoker  . Smokeless tobacco: Never Used  Substance and Sexual Activity  . Alcohol use: No  . Drug use: No  . Sexual activity: Yes  Lifestyle  . Physical activity    Days per week: 0 days    Minutes per session: 0 min  . Stress: Not at all  Relationships  . Social Herbalist on phone: Not on file    Gets together: Not on file  Attends religious service: Not on file    Active member of club or organization: Not on file    Attends meetings of clubs or organizations: Not on file    Relationship status: Not on file  Other Topics Concern  . Not on file  Social History Narrative  . Not on file     Family History:  The patient's family history includes Arthritis in her mother; Breast cancer in her maternal aunt, mother, and paternal aunt; Diabetes in her father and mother; Heart disease in her father; Leukemia in her brother; Stomach cancer in her father.   ROS:   Please see the history of present illness.    ROS All other systems are reviewed and are negative  PHYSICAL EXAM:   VS:  BP (!) 162/84 (BP Location: Left Arm, Patient Position: Sitting, Cuff Size: Normal)   Pulse 84   Temp (!) 97.2 F (36.2 C)   Ht 5\' 3"  (1.6 m)   Wt 147 lb (66.7 kg)   BMI 26.04 kg/m      General: Alert, oriented x3, no distress, minimally overweight Head: no evidence of trauma, PERRL, EOMI, no exophtalmos or lid lag, no myxedema, no xanthelasma; normal ears, nose and oropharynx Neck: normal jugular venous pulsations and no hepatojugular reflux; brisk carotid pulses without delay and no carotid bruits Chest: clear to auscultation, no signs of consolidation by percussion or palpation, normal fremitus, symmetrical and full respiratory excursions Cardiovascular: normal position and quality of the apical impulse, regular rhythm,  normal first and second heart sounds, no murmurs, rubs or gallops Abdomen: no tenderness or distention, no masses by palpation, no abnormal pulsatility or arterial bruits, normal bowel sounds, no hepatosplenomegaly Extremities: no clubbing, cyanosis or edema; 2+ radial, ulnar and brachial pulses bilaterally; 2+ right femoral, posterior tibial and dorsalis pedis pulses; 2+ left femoral, posterior tibial and dorsalis pedis pulses; no subclavian or femoral bruits Neurological: grossly nonfocal Psych: Normal mood and affect    Wt Readings from Last 3 Encounters:  03/26/19 147 lb (66.7 kg)  02/19/19 143 lb (64.9 kg)  05/01/18 132 lb (59.9 kg)      Studies/Labs Reviewed:   EKG:  EKG is ordered today.  The ekg ordered today demonstrates normal sinus rhythm, tracing, QTC 449 ms  Recent Labs: February 25, 2019 hemoglobin A1c 6.3%, creatinine 0.9, glucose 106  Lipid Panel    Component Value Date/Time   CHOL 146 02/09/2018 1033   TRIG 238.0 (H) 02/09/2018 1033   HDL 50.80 02/09/2018 1033   CHOLHDL 3 02/09/2018 1033   VLDL 47.6 (H) 02/09/2018 1033   LDLCALC 71 10/30/2014 0925   LDLDIRECT 70.0 02/09/2018 1033   February 25, 2019 total cholesterol 140, triglyceride 191, HDL 46, LDL 56  ASSESSMENT:    1. Essential hypertension   2. Mixed hyperlipidemia   3. Diabetes mellitus type 2 in nonobese (HCC)   4. OSA (obstructive sleep apnea)      PLAN:  In order of problems listed above:  1. HTN: She monitors her blood pressure carefully at home with a manometer that has been confirmed to be accurate.  Typical blood pressures in the 120s/70s.  No changes made to her medications 2. HLP: With weight loss she has had a marked improvement in glycemic control as well as all her lipid parameters.  Only the triglycerides remain slightly high. 3. DM: Excellent control with lifestyle changes and metformin in a very low dose. 4. OSA: I offered her an appointment with Dr. Claiborne Billings  in the sleep clinic to see  if there is a way to optimize her CPAP settings.     Medication Adjustments/Labs and Tests Ordered: Current medicines are reviewed at length with the patient today.  Concerns regarding medicines are outlined above.  Medication changes, Labs and Tests ordered today are listed in the Patient Instructions below. Patient Instructions  Medication Instructions:  Your physician recommends that you continue on your current medications as directed. Please refer to the Current Medication list given to you today.  If you need a refill on your cardiac medications before your next appointment, please call your pharmacy.   Lab work: None ordered If you have labs (blood work) drawn today and your tests are completely normal, you will receive your results only by: [place] (if you have MyChart) OR A paper copy in the mail If you have any lab test that is abnormal or we need to change your treatment, we will call you to review the results.  Testing/Procedures: None ordered  Follow-Up: At [facility], you and your health needs are our priority.  As part of our continuing mission to provide you with exceptional heart care, we have created designated Provider Care Teams.  These Care Teams include your primary Cardiologist (physician) and Advanced Practice Providers (APPs -  Physician Assistants and Nurse Practitioners) who all work together to provide you with the care you need, when you need it. You will need a follow up appointment in 12 months.  Please call our office 2 months in advance to schedule this appointment.  You may see Sanda Klein, MD or one of the following Advanced Practice Providers on your designated Care Team: Almyra Deforest, PA-C Fabian Sharp, PA-C            Signed, Sanda Klein, MD  03/31/2019 10:11 AM    [facility] [address], [place], [place]  [address] Phone: [phone]; Fax: [phone]

## 2019-03-27 ENCOUNTER — Other Ambulatory Visit: Payer: Self-pay

## 2019-03-27 DIAGNOSIS — I10 Essential (primary) hypertension: Secondary | ICD-10-CM

## 2019-03-27 DIAGNOSIS — R55 Syncope and collapse: Secondary | ICD-10-CM

## 2019-03-27 DIAGNOSIS — G4733 Obstructive sleep apnea (adult) (pediatric): Secondary | ICD-10-CM

## 2019-03-27 DIAGNOSIS — I4729 Other ventricular tachycardia: Secondary | ICD-10-CM

## 2019-03-27 DIAGNOSIS — I2510 Atherosclerotic heart disease of native coronary artery without angina pectoris: Secondary | ICD-10-CM

## 2019-03-27 DIAGNOSIS — I472 Ventricular tachycardia: Secondary | ICD-10-CM

## 2019-03-29 ENCOUNTER — Other Ambulatory Visit: Payer: Self-pay | Admitting: Cardiovascular Disease

## 2019-03-31 ENCOUNTER — Encounter: Payer: Self-pay | Admitting: Cardiovascular Disease

## 2019-04-16 ENCOUNTER — Other Ambulatory Visit: Payer: Self-pay | Admitting: Family Medicine

## 2019-04-16 NOTE — Telephone Encounter (Signed)
Last office visit 02/22/2019 for CPE.  Last refilled 11/28/2017 for #10 with no refills.  CPE scheduled for 03/17/2020.

## 2019-04-26 ENCOUNTER — Other Ambulatory Visit: Payer: Self-pay | Admitting: Family Medicine

## 2019-04-26 NOTE — Telephone Encounter (Signed)
Last office visit 02/22/2019 for CPE.  Last refilled 12/07/2018 for #60 with 1 refill.  CPE scheduled for 03/07/2020.

## 2019-05-07 ENCOUNTER — Ambulatory Visit (INDEPENDENT_AMBULATORY_CARE_PROVIDER_SITE_OTHER): Payer: PPO

## 2019-05-07 DIAGNOSIS — Z23 Encounter for immunization: Secondary | ICD-10-CM

## 2019-07-15 ENCOUNTER — Other Ambulatory Visit: Payer: Self-pay | Admitting: Family Medicine

## 2019-07-15 DIAGNOSIS — Z1231 Encounter for screening mammogram for malignant neoplasm of breast: Secondary | ICD-10-CM

## 2019-08-28 ENCOUNTER — Ambulatory Visit
Admission: RE | Admit: 2019-08-28 | Discharge: 2019-08-28 | Disposition: A | Discharge status: Home / Self Care | Payer: PPO | Source: Ambulatory Visit | Attending: Family Medicine | Admitting: Family Medicine

## 2019-08-28 ENCOUNTER — Other Ambulatory Visit: Payer: Self-pay

## 2019-08-28 DIAGNOSIS — Z1231 Encounter for screening mammogram for malignant neoplasm of breast: Secondary | ICD-10-CM

## 2019-09-03 DIAGNOSIS — H524 Presbyopia: Secondary | ICD-10-CM | POA: Diagnosis not present

## 2019-09-03 DIAGNOSIS — E1165 Type 2 diabetes mellitus with hyperglycemia: Secondary | ICD-10-CM | POA: Diagnosis not present

## 2019-09-03 DIAGNOSIS — E039 Hypothyroidism, unspecified: Secondary | ICD-10-CM | POA: Diagnosis not present

## 2019-09-03 DIAGNOSIS — E7800 Pure hypercholesterolemia, unspecified: Secondary | ICD-10-CM | POA: Diagnosis not present

## 2019-09-03 DIAGNOSIS — H2511 Age-related nuclear cataract, right eye: Secondary | ICD-10-CM | POA: Diagnosis not present

## 2019-09-03 LAB — HM DIABETES EYE EXAM

## 2019-09-04 ENCOUNTER — Encounter: Payer: Self-pay | Admitting: Family Medicine

## 2019-09-09 DIAGNOSIS — E1165 Type 2 diabetes mellitus with hyperglycemia: Secondary | ICD-10-CM | POA: Diagnosis not present

## 2019-09-09 DIAGNOSIS — E7800 Pure hypercholesterolemia, unspecified: Secondary | ICD-10-CM | POA: Diagnosis not present

## 2019-09-09 DIAGNOSIS — R7989 Other specified abnormal findings of blood chemistry: Secondary | ICD-10-CM | POA: Diagnosis not present

## 2019-09-09 DIAGNOSIS — I10 Essential (primary) hypertension: Secondary | ICD-10-CM | POA: Diagnosis not present

## 2019-09-09 DIAGNOSIS — E039 Hypothyroidism, unspecified: Secondary | ICD-10-CM | POA: Diagnosis not present

## 2019-10-02 DIAGNOSIS — H25811 Combined forms of age-related cataract, right eye: Secondary | ICD-10-CM | POA: Diagnosis not present

## 2019-10-02 DIAGNOSIS — H2511 Age-related nuclear cataract, right eye: Secondary | ICD-10-CM | POA: Diagnosis not present

## 2019-10-02 DIAGNOSIS — H21561 Pupillary abnormality, right eye: Secondary | ICD-10-CM | POA: Diagnosis not present

## 2019-10-28 DIAGNOSIS — H43813 Vitreous degeneration, bilateral: Secondary | ICD-10-CM | POA: Diagnosis not present

## 2019-10-28 DIAGNOSIS — H43822 Vitreomacular adhesion, left eye: Secondary | ICD-10-CM | POA: Diagnosis not present

## 2019-10-28 DIAGNOSIS — H35341 Macular cyst, hole, or pseudohole, right eye: Secondary | ICD-10-CM | POA: Diagnosis not present

## 2019-11-04 DIAGNOSIS — A932 Colorado tick fever: Secondary | ICD-10-CM | POA: Diagnosis not present

## 2019-11-04 DIAGNOSIS — S20462A Insect bite (nonvenomous) of left back wall of thorax, initial encounter: Secondary | ICD-10-CM | POA: Diagnosis not present

## 2019-11-06 DIAGNOSIS — H33321 Round hole, right eye: Secondary | ICD-10-CM | POA: Diagnosis not present

## 2019-11-06 DIAGNOSIS — H35341 Macular cyst, hole, or pseudohole, right eye: Secondary | ICD-10-CM | POA: Diagnosis not present

## 2019-12-11 DIAGNOSIS — H43822 Vitreomacular adhesion, left eye: Secondary | ICD-10-CM | POA: Diagnosis not present

## 2020-01-01 DIAGNOSIS — R490 Dysphonia: Secondary | ICD-10-CM | POA: Diagnosis not present

## 2020-01-01 DIAGNOSIS — J387 Other diseases of larynx: Secondary | ICD-10-CM | POA: Diagnosis not present

## 2020-01-01 DIAGNOSIS — Z79899 Other long term (current) drug therapy: Secondary | ICD-10-CM | POA: Diagnosis not present

## 2020-01-01 DIAGNOSIS — R05 Cough: Secondary | ICD-10-CM | POA: Diagnosis not present

## 2020-01-10 DIAGNOSIS — Z961 Presence of intraocular lens: Secondary | ICD-10-CM | POA: Diagnosis not present

## 2020-01-14 DIAGNOSIS — L578 Other skin changes due to chronic exposure to nonionizing radiation: Secondary | ICD-10-CM | POA: Diagnosis not present

## 2020-01-14 DIAGNOSIS — L820 Inflamed seborrheic keratosis: Secondary | ICD-10-CM | POA: Diagnosis not present

## 2020-02-03 ENCOUNTER — Other Ambulatory Visit: Payer: Self-pay | Admitting: Family Medicine

## 2020-02-10 ENCOUNTER — Other Ambulatory Visit: Payer: Self-pay | Admitting: Family Medicine

## 2020-03-12 ENCOUNTER — Ambulatory Visit: Payer: PPO

## 2020-03-17 ENCOUNTER — Encounter: Payer: PPO | Admitting: Family Medicine

## 2020-03-19 DIAGNOSIS — E7800 Pure hypercholesterolemia, unspecified: Secondary | ICD-10-CM | POA: Diagnosis not present

## 2020-03-19 DIAGNOSIS — E1165 Type 2 diabetes mellitus with hyperglycemia: Secondary | ICD-10-CM | POA: Diagnosis not present

## 2020-03-19 DIAGNOSIS — E039 Hypothyroidism, unspecified: Secondary | ICD-10-CM | POA: Diagnosis not present

## 2020-03-19 LAB — LIPID PANEL
Cholesterol: 131 (ref 0–200)
HDL: 49 (ref 35–70)
LDL Cholesterol: 55
Triglycerides: 164 — AB (ref 40–160)

## 2020-03-19 LAB — COMPREHENSIVE METABOLIC PANEL
Calcium: 9.7 (ref 8.7–10.7)
GFR calc Af Amer: 70
GFR calc non Af Amer: 61

## 2020-03-19 LAB — BASIC METABOLIC PANEL
BUN: 13 (ref 4–21)
CO2: 23 — AB (ref 13–22)
Chloride: 103 (ref 99–108)
Creatinine: 0.9 (ref 0.5–1.1)
Glucose: 109
Potassium: 4.8 (ref 3.4–5.3)
Sodium: 141 (ref 137–147)

## 2020-03-19 LAB — HEMOGLOBIN A1C: Hemoglobin A1C: 6.6

## 2020-03-19 LAB — HEPATIC FUNCTION PANEL
ALT: 27 (ref 7–35)
AST: 33 (ref 13–35)
Alkaline Phosphatase: 56 (ref 25–125)

## 2020-03-20 ENCOUNTER — Other Ambulatory Visit: Payer: Self-pay

## 2020-03-20 ENCOUNTER — Ambulatory Visit (INDEPENDENT_AMBULATORY_CARE_PROVIDER_SITE_OTHER): Payer: PPO

## 2020-03-20 DIAGNOSIS — Z0000 Encounter for general adult medical examination without abnormal findings: Secondary | ICD-10-CM | POA: Diagnosis not present

## 2020-03-20 NOTE — Patient Instructions (Signed)
Valerie Vargas , Thank you for taking time to come for your Medicare Wellness Visit. I appreciate your ongoing commitment to your health goals. Please review the following plan we discussed and let me know if I can assist you in the future.   Screening recommendations/referrals: Colonoscopy: Up to date, completed 03/15/2013, due 02/2023 Mammogram: Up to date, completed 08/28/2019, due 08/2020 Bone Density: Up to date, completed 05/01/2017, due 2-5 years  Recommended yearly ophthalmology/optometry visit for glaucoma screening and checkup Recommended yearly dental visit for hygiene and checkup  Vaccinations: Influenza vaccine: due, will get at upcoming office visit  Pneumococcal vaccine: Completed series Tdap vaccine: Up to date, completed 06/03/2015, due 05/2025 Shingles vaccine: due, check with your insurance regarding coverage   Covid-19:Completed series  Advanced directives: Please bring a copy of your POA (Power of Attorney) and/or Living Will to your next appointment.   Conditions/risks identified: Diabetes  Next appointment: Follow up in one year for your annual wellness visit    Preventive Care 65 Years and Older, Female Preventive care refers to lifestyle choices and visits with your health care provider that can promote health and wellness. What does preventive care include?  A yearly physical exam. This is also called an annual well check.  Dental exams once or twice a year.  Routine eye exams. Ask your health care provider how often you should have your eyes checked.  Personal lifestyle choices, including:  Daily care of your teeth and gums.  Regular physical activity.  Eating a healthy diet.  Avoiding tobacco and drug use.  Limiting alcohol use.  Practicing safe sex.  Taking low-dose aspirin every day.  Taking vitamin and mineral supplements as recommended by your health care provider. What happens during an annual well check? The services and screenings done by  your health care provider during your annual well check will depend on your age, overall health, lifestyle risk factors, and family history of disease. Counseling  Your health care provider may ask you questions about your:  Alcohol use.  Tobacco use.  Drug use.  Emotional well-being.  Home and relationship well-being.  Sexual activity.  Eating habits.  History of falls.  Memory and ability to understand (cognition).  Work and work Statistician.  Reproductive health. Screening  You may have the following tests or measurements:  Height, weight, and BMI.  Blood pressure.  Lipid and cholesterol levels. These may be checked every 5 years, or more frequently if you are over 38 years old.  Skin check.  Lung cancer screening. You may have this screening every year starting at age 26 if you have a 30-pack-year history of smoking and currently smoke or have quit within the past 15 years.  Fecal occult blood test (FOBT) of the stool. You may have this test every year starting at age 77.  Flexible sigmoidoscopy or colonoscopy. You may have a sigmoidoscopy every 5 years or a colonoscopy every 10 years starting at age 11.  Hepatitis C blood test.  Hepatitis B blood test.  Sexually transmitted disease (STD) testing.  Diabetes screening. This is done by checking your blood sugar (glucose) after you have not eaten for a while (fasting). You may have this done every 1-3 years.  Bone density scan. This is done to screen for osteoporosis. You may have this done starting at age 17.  Mammogram. This may be done every 1-2 years. Talk to your health care provider about how often you should have regular mammograms. Talk with your health care provider  about your test results, treatment options, and if necessary, the need for more tests. Vaccines  Your health care provider may recommend certain vaccines, such as:  Influenza vaccine. This is recommended every year.  Tetanus,  diphtheria, and acellular pertussis (Tdap, Td) vaccine. You may need a Td booster every 10 years.  Zoster vaccine. You may need this after age 9.  Pneumococcal 13-valent conjugate (PCV13) vaccine. One dose is recommended after age 104.  Pneumococcal polysaccharide (PPSV23) vaccine. One dose is recommended after age 12. Talk to your health care provider about which screenings and vaccines you need and how often you need them. This information is not intended to replace advice given to you by your health care provider. Make sure you discuss any questions you have with your health care provider. Document Released: 07/31/2015 Document Revised: 03/23/2016 Document Reviewed: 05/05/2015 Elsevier Interactive Patient Education  2017 [place] Prevention in the Home Falls can cause injuries. They can happen to people of all ages. There are many things you can do to make your home safe and to help prevent falls. What can I do on the outside of my home?  Regularly fix the edges of walkways and driveways and fix any cracks.  Remove anything that might make you trip as you walk through a door, such as a raised step or threshold.  Trim any bushes or trees on the path to your home.  Use bright outdoor lighting.  Clear any walking paths of anything that might make someone trip, such as rocks or tools.  Regularly check to see if handrails are loose or broken. Make sure that both sides of any steps have handrails.  Any raised decks and porches should have guardrails on the edges.  Have any leaves, snow, or ice cleared regularly.  Use sand or salt on walking paths during winter.  Clean up any spills in your garage right away. This includes oil or grease spills. What can I do in the bathroom?  Use night lights.  Install grab bars by the toilet and in the tub and shower. Do not use towel bars as grab bars.  Use non-skid mats or decals in the tub or shower.  If you need to sit down in  the shower, use a plastic, non-slip stool.  Keep the floor dry. Clean up any water that spills on the floor as soon as it happens.  Remove soap buildup in the tub or shower regularly.  Attach bath mats securely with double-sided non-slip rug tape.  Do not have throw rugs and other things on the floor that can make you trip. What can I do in the bedroom?  Use night lights.  Make sure that you have a light by your bed that is easy to reach.  Do not use any sheets or blankets that are too big for your bed. They should not hang down onto the floor.  Have a firm chair that has side arms. You can use this for support while you get dressed.  Do not have throw rugs and other things on the floor that can make you trip. What can I do in the kitchen?  Clean up any spills right away.  Avoid walking on wet floors.  Keep items that you use a lot in easy-to-reach places.  If you need to reach something above you, use a strong step stool that has a grab bar.  Keep electrical cords out of the way.  Do not use floor polish or  wax that makes floors slippery. If you must use wax, use non-skid floor wax.  Do not have throw rugs and other things on the floor that can make you trip. What can I do with my stairs?  Do not leave any items on the stairs.  Make sure that there are handrails on both sides of the stairs and use them. Fix handrails that are broken or loose. Make sure that handrails are as long as the stairways.  Check any carpeting to make sure that it is firmly attached to the stairs. Fix any carpet that is loose or worn.  Avoid having throw rugs at the top or bottom of the stairs. If you do have throw rugs, attach them to the floor with carpet tape.  Make sure that you have a light switch at the top of the stairs and the bottom of the stairs. If you do not have them, ask someone to add them for you. What else can I do to help prevent falls?  Wear shoes that:  Do not have high  heels.  Have rubber bottoms.  Are comfortable and fit you well.  Are closed at the toe. Do not wear sandals.  If you use a stepladder:  Make sure that it is fully opened. Do not climb a closed stepladder.  Make sure that both sides of the stepladder are locked into place.  Ask someone to hold it for you, if possible.  Clearly mark and make sure that you can see:  Any grab bars or handrails.  First and last steps.  Where the edge of each step is.  Use tools that help you move around (mobility aids) if they are needed. These include:  Canes.  Walkers.  Scooters.  Crutches.  Turn on the lights when you go into a dark area. Replace any light bulbs as soon as they burn out.  Set up your furniture so you have a clear path. Avoid moving your furniture around.  If any of your floors are uneven, fix them.  If there are any pets around you, be aware of where they are.  Review your medicines with your doctor. Some medicines can make you feel dizzy. This can increase your chance of falling. Ask your doctor what other things that you can do to help prevent falls. This information is not intended to replace advice given to you by your health care provider. Make sure you discuss any questions you have with your health care provider. Document Released: 04/30/2009 Document Revised: 12/10/2015 Document Reviewed: 08/08/2014 Elsevier Interactive Patient Education  2017 [employer].

## 2020-03-20 NOTE — Progress Notes (Signed)
Subjective:   Valerie Vargas is a 74 y.o. female who presents for Medicare Annual (Subsequent) preventive examination.  Review of Systems: N/A      I connected with the patient today by telephone and verified that I am speaking with the correct person using two identifiers. Location patient: home Location nurse: work Persons participating in the telephone visit: patient, nurse.   I discussed the limitations, risks, security and privacy concerns of performing an evaluation and management service by telephone and the availability of in person appointments. I also discussed with the patient that there may be a patient responsible charge related to this service. The patient expressed understanding and verbally consented to this telephonic visit.        Cardiac Risk Factors include: advanced age (>73men, >95 women);diabetes mellitus     Objective:    Today's Vitals   There is no height or weight on file to calculate BMI.  Advanced Directives 03/20/2020 02/19/2019 07/01/2018 05/01/2018 02/09/2018 02/08/2017 01/14/2016  Does Patient Have a Medical Advance Directive? Yes Yes Yes Yes Yes Yes Yes  Type of Paramedic of [place];Living will [place];Living will [place];Living will [place];Living will [place];Living will [place];Living will [place];Living will  Does patient want to make changes to medical advance directive? - - No - Patient declined No - Patient declined - - No - Patient declined  Copy of [place] in Chart? No - copy requested No - copy requested - No - copy requested No - copy requested No - copy requested No - copy requested  Would patient like information on creating a medical advance directive? - - No - Patient declined - - - -    Current Medications (verified) Outpatient Encounter Medications as of 03/20/2020    Medication Sig  . ALPRAZolam (XANAX) 0.5 MG tablet TAKE 1 TABLET BY MOUTH EVERY DAY AS NEEDED  . Ascorbic Acid (VITAMIN C) 1000 MG tablet Take 1,000 mg by mouth daily.    . benzonatate (TESSALON) 100 MG capsule TAKE TWO CAPSULES BY MOUTH 3 TIMES A DAYAS NEEDED FOR COUGH  . Cholecalciferol (VITAMIN D3) 1000 UNITS CAPS Take 1 capsule by mouth daily.    . Diphenhydramine-PE-APAP (BENADRYL ALLERGY/SINUS HEADACH) 12.5-5-325 MG TABS Take 0.5 tablets by mouth as needed.   . gabapentin (NEURONTIN) 100 MG tablet Take 300 mg by mouth 3 (three) times daily.    Marland Kitchen glucose blood (ONE TOUCH ULTRA TEST) test strip -TEST TWICE DAILY  . Guaifenesin 1200 MG TB12 Take by mouth as needed.   Marland Kitchen L-Theanine 100 MG CAPS Take 2 tablets by mouth as needed.   Marland Kitchen levothyroxine (SYNTHROID, LEVOTHROID) 75 MCG tablet Take 75 mcg by mouth daily before breakfast.  . loratadine (CLARITIN) 10 MG tablet Take 10 mg by mouth daily.  Marland Kitchen MAGNESIUM GLYCINATE PLUS PO Take 1 capsule by mouth daily.   . meloxicam (MOBIC) 15 MG tablet Take 1 tablet (15 mg total) by mouth daily.  . metFORMIN (GLUCOPHAGE-XR) 500 MG 24 hr tablet   . metoprolol succinate (TOPROL-XL) 50 MG 24 hr tablet TAKE 1 TABLET BY MOUTH EVERY DAY (TAKE WITH OR IMMEDIATELY FOLLOWING A MEAL)  . metoprolol succinate (TOPROL-XL) 50 MG 24 hr tablet TAKE 1 TABLET BY MOUTH EVERY DAY  . nystatin (MYCOSTATIN) 100000 UNIT/ML suspension Take 5 mLs (500,000 Units total) by mouth 4 (four) times daily. Continue 3 days after thrush  appears resolved  . OMEGA-3 1000 MG CAPS Take 1,000 mg by mouth daily.    Glory Rosebush DELICA LANCETS 25D MISC -TEST TWICE DAILY  . promethazine-dextromethorphan (PROMETHAZINE-DM) 6.25-15 MG/5ML syrup Take 5 mLs by mouth 4 (four) times daily as needed for cough.  . Pyridoxine HCl (VITAMIN B-6 PO) Take 1 tablet by mouth daily.  . rosuvastatin (CRESTOR) 10 MG tablet TAKE 1 TABLET BY MOUTH EVERY DAY FOR CHOLESTEROL  . scopolamine (TRANSDERM-SCOP, 1.5 MG,) 1  MG/3DAYS Place 1 patch (1.5 mg total) onto the skin every 3 (three) days.  . Thiamine HCl (VITAMIN B-1) 250 MG tablet Take 250 mg by mouth daily.    . VENTOLIN HFA 108 (90 Base) MCG/ACT inhaler USE 2 PUFFS BY MOUTH EVERY 6 HOURS AS NEEDED FOR WHEEZING FOR SHORTNESS OF BREATH  . vitamin B-12 (CYANOCOBALAMIN) 1000 MCG tablet Take 1,000 mcg by mouth daily.     No facility-administered encounter medications on file as of 03/20/2020.    Allergies (verified) Hctz [hydrochlorothiazide], Penicillins, and Sulfa antibiotics   History: Past Medical History:  Diagnosis Date  . Allergy, unspecified not elsewhere classified   . Chronic interstitial cystitis   . Cough   . Esophageal reflux   . H/O syncope 2007   No recurrence;Tilt table 09/23/05-negative  . Hyperlipidemia 08/22/2014  . Hypertension   . Hypertriglyceridemia   . Irritable bowel syndrome   . Nonocclusive coronary atherosclerosis of native coronary artery   . Obstructive sleep apnea (adult) (pediatric)   . Paroxysmal ventricular tachycardia ([place])   . Type II or unspecified type diabetes mellitus without mention of complication, not stated as uncontrolled   . Unspecified asthma(493.90)   . Unspecified hypothyroidism   . Ventricular tachycardia [facility])    Past Surgical History:  Procedure Laterality Date  . ABDOMINAL HYSTERECTOMY  11/1979   for endometriosis  . CHOLECYSTECTOMY  07/2000  . ESOPHAGOGASTRODUODENOSCOPY ENDOSCOPY  2010   Family History  Problem Relation Age of Onset  . Diabetes Mother   . Breast cancer Mother   . Arthritis Mother   . Diabetes Father   . Stomach cancer Father   . Heart disease Father   . Leukemia Brother   . Breast cancer Maternal Aunt   . Breast cancer Paternal Aunt    Social History   Socioeconomic History  . Marital status: Married    Spouse name: Not on file  . Number of children: Not on file  . Years of education: Not on file  . Highest education level: Not on file  Occupational History   . Occupation: retired  Tobacco Use  . Smoking status: Never Smoker  . Smokeless tobacco: Never Used  Vaping Use  . Vaping Use: Never used  Substance and Sexual Activity  . Alcohol use: No  . Drug use: No  . Sexual activity: Yes  Other Topics Concern  . Not on file  Social History Narrative  . Not on file   Social Determinants of Health   Financial Resource Strain:   . Difficulty of Paying Living Expenses: Not on file  Food Insecurity:   . Worried About Charity fundraiser in the Last Year: Not on file  . Ran Out of Food in the Last Year: Not on file  Transportation Needs:   . Lack of Transportation (Medical): Not on file  . Lack of Transportation (Non-Medical): Not on file  Physical Activity:   . Days of Exercise per Week: Not on file  . Minutes of Exercise per  Session: Not on file  Stress:   . Feeling of Stress : Not on file  Social Connections:   . Frequency of Communication with Friends and Family: Not on file  . Frequency of Social Gatherings with Friends and Family: Not on file  . Attends Religious Services: Not on file  . Active Member of Clubs or Organizations: Not on file  . Attends Archivist Meetings: Not on file  . Marital Status: Not on file    Tobacco Counseling Counseling given: Not Answered   Clinical Intake:  Pre-visit preparation completed: Yes  Pain : No/denies pain     Nutritional Risks: Nausea/ vomitting/ diarrhea (diarrhea (chronic issue)) Diabetes: Yes CBG done?: No Did pt. bring in CBG monitor from home?: No  How often do you need to have someone help you when you read instructions, pamphlets, or other written materials from your doctor or pharmacy?: 1 - Never What is the last grade level you completed in school?: some college  Diabetic: Yes Nutrition Risk Assessment:  Has the patient had any N/V/D within the last 2 months?  Yes  diarrhea and constipation at times Does the patient have any non-healing wounds?  No  Has  the patient had any unintentional weight loss or weight gain?  No   Diabetes:  Is the patient diabetic?  Yes  If diabetic, was a CBG obtained today?  No  Did the patient bring in their glucometer from home?  No  How often do you monitor your CBG's? When needed.   Financial Strains and Diabetes Management:  Are you having any financial strains with the device, your supplies or your medication? No .  Does the patient want to be seen by Chronic Care Management for management of their diabetes?  No  Would the patient like to be referred to a Nutritionist or for Diabetic Management?  No   Diabetic Exams:  Diabetic Eye Exam: Completed 09/03/2019 Diabetic Foot Exam: Completed 09/09/2019   Interpreter Needed?: No  Information entered by :: CJohnson, LPN   Activities of Daily Living In your present state of health, do you have any difficulty performing the following activities: 03/20/2020  Hearing? Y  Vision? Y  Comment has vision issues, has had surgery for this  Difficulty concentrating or making decisions? N  Walking or climbing stairs? N  Dressing or bathing? N  Doing errands, shopping? N  Preparing Food and eating ? N  Using the Toilet? N  In the past six months, have you accidently leaked urine? N  Do you have problems with loss of bowel control? N  Managing your Medications? N  Managing your Finances? N  Housekeeping or managing your Housekeeping? N  Some recent data might be hidden    Patient Care Team: Jinny Sanders, MD as PCP - General (Family Medicine) Jacelyn Pi, MD as Attending Physician (Endocrinology) Ernestine Conrad, MD as Referring Physician (Otolaryngology) Sanda Klein, MD as Consulting Physician (Cardiology) Jola Schmidt, MD as Consulting Physician (Ophthalmology) Levin Erp, DDS, PA as Referring Physician (Dentistry)  Indicate any recent Medical Services you may have received from other than [facility] providers in the past year (date may  be approximate).     Assessment:   This is a routine wellness examination for Valerie Vargas.  Hearing/Vision screen  Hearing Screening   125Hz  250Hz  500Hz  1000Hz  2000Hz  3000Hz  4000Hz  6000Hz  8000Hz   Right ear:           Left ear:  Vision Screening Comments: Patient gets annual eye exams  Dietary issues and exercise activities discussed: Current Exercise Habits: Home exercise routine, Type of exercise: walking, Time (Minutes): 20, Frequency (Times/Week): 7, Weekly Exercise (Minutes/Week): 140, Intensity: Moderate, Exercise limited by: None identified  Goals    .  Increase water intake (pt-stated)      Starting 02/08/2017, I will attempt to drink at least 6 glasses of water daily.    .  Patient Stated      Starting 02/09/2018, I will continue to walk 6000-10000 steps daily.     .  Patient Stated      Wants to get more limber and get down to 130 pounds    .  Patient Stated      03/20/2020, I will continue to walk 5,000 steps a day.      Depression Screen PHQ 2/9 Scores 03/20/2020 02/19/2019 02/09/2018 02/08/2017 01/14/2016 11/06/2014 08/15/2013  PHQ - 2 Score 0 0 0 0 0 0 0  PHQ- 9 Score 0 6 0 - - - -    Fall Risk Fall Risk  03/20/2020 02/19/2019 02/09/2018 02/08/2017 01/14/2016  Falls in the past year? 0 0 No No No  Number falls in past yr: 0 - - - -  Injury with Fall? 0 - - - -  Risk for fall due to : Medication side effect Medication side effect - - -  Follow up Falls evaluation completed;Falls prevention discussed Falls evaluation completed;Falls prevention discussed - - -    Any stairs in or around the home? Yes  If so, are there any without handrails? No  Home free of loose throw rugs in walkways, pet beds, electrical cords, etc? Yes  Adequate lighting in your home to reduce risk of falls? Yes   ASSISTIVE DEVICES UTILIZED TO PREVENT FALLS:  Life alert? No  Use of a cane, walker or w/c? No  Grab bars in the bathroom? No  Shower chair or bench in shower? No  Elevated toilet seat  or a handicapped toilet? No   TIMED UP AND GO:  Was the test performed? N/A, telephonic visit .   Cognitive Function: MMSE - Mini Mental State Exam 03/20/2020 02/19/2019 02/09/2018 02/08/2017 01/14/2016  Orientation to time 5 5 5 5 5   Orientation to Place 5 5 5 5 5   Registration 3 3 3 3 3   Attention/ Calculation 5 5 0 0 0  Recall 3 3 3 2 3   Language- name 2 objects - 0 0 0 0  Language- repeat 1 1 1 1 1   Language- follow 3 step command - 0 3 3 3   Language- read & follow direction - 0 0 0 0  Write a sentence - 0 0 0 0  Copy design - 0 0 0 0  Total score - 22 20 19 20   Mini Cog  Mini-Cog screen was completed. Maximum score is 22. A value of 0 denotes this part of the MMSE was not completed or the patient failed this part of the Mini-Cog screening.       Immunizations Immunization History  Administered Date(s) Administered  . Fluad Quad(high Dose 65+) 05/07/2019  . Influenza Split 05/15/2013  . Influenza, High Dose Seasonal PF 05/09/2017, 04/25/2018  . Influenza-Unspecified 05/01/2014, 06/03/2015, 04/19/2016  . PFIZER SARS-COV-2 Vaccination 07/30/2019, 08/20/2019  . Pneumococcal Conjugate-13 08/15/2012  . Pneumococcal Polysaccharide-23 11/06/2014  . Tdap 06/03/2015  . Zoster 03/06/2013    TDAP status: Up to date Flu Vaccine status: due, will get at upcoming  office visit  Pneumococcal vaccine status: Up to date Covid-19 vaccine status: Completed vaccines  Qualifies for Shingles Vaccine? Yes   Zostavax completed Yes   Shingrix Completed?: No.    Education has been provided regarding the importance of this vaccine. Patient has been advised to call insurance company to determine out of pocket expense if they have not yet received this vaccine. Advised may also receive vaccine at local pharmacy or Health Dept. Verbalized acceptance and understanding.  Screening Tests Health Maintenance  Topic Date Due  . HEMOGLOBIN A1C  08/23/2019  . INFLUENZA VACCINE  02/16/2020  . URINE  MICROALBUMIN  02/20/2020  . MAMMOGRAM  08/27/2020  . OPHTHALMOLOGY EXAM  09/02/2020  . FOOT EXAM  09/08/2020  . COLONOSCOPY  03/16/2023  . TETANUS/TDAP  06/02/2025  . DEXA SCAN  Completed  . COVID-19 Vaccine  Completed  . Hepatitis C Screening  Completed  . PNA vac Low Risk Adult  Completed    Health Maintenance  Health Maintenance Due  Topic Date Due  . HEMOGLOBIN A1C  08/23/2019  . INFLUENZA VACCINE  02/16/2020  . URINE MICROALBUMIN  02/20/2020    Colorectal cancer screening: Completed 03/15/2013. Repeat every 10 years Mammogram status: Completed 08/28/2019. Repeat every year Bone Density status: Completed 05/01/2017. Results reflect: Bone density results: NORMAL. Repeat every 2-5 years.  Lung Cancer Screening: (Low Dose CT Chest recommended if Age 67-80 years, 30 pack-year currently smoking OR have quit w/in 15 years.) does not qualify.    Additional Screening:  Hepatitis C Screening: does qualify; Completed 01/14/2016  Vision Screening: Recommended annual ophthalmology exams for early detection of glaucoma and other disorders of the eye. Is the patient up to date with their annual eye exam?  Yes  Who is the provider or what is the name of the office in which the patient attends annual eye exams? Dr. Jola Schmidt If pt is not established with a provider, would they like to be referred to a provider to establish care? No .   Dental Screening: Recommended annual dental exams for proper oral hygiene  Community Resource Referral / Chronic Care Management: CRR required this visit?  No   CCM required this visit?  No      Plan:     I have personally reviewed and noted the following in the patient's chart:   . Medical and social history . Use of alcohol, tobacco or illicit drugs  . Current medications and supplements . Functional ability and status . Nutritional status . Physical activity . Advanced directives . List of other physicians . Hospitalizations,  surgeries, and ER visits in previous 12 months . Vitals . Screenings to include cognitive, depression, and falls . Referrals and appointments  In addition, I have reviewed and discussed with patient certain preventive protocols, quality metrics, and best practice recommendations. A written personalized care plan for preventive services as well as general preventive health recommendations were provided to patient.   Due to this being a telephonic visit, the after visit summary with patients personalized plan was offered to patient via mail or my-chart. Patient preferred to pick up at office at next visit.   Andrez Grime, LPN   07/21/863

## 2020-03-20 NOTE — Progress Notes (Signed)
PCP notes:  Health Maintenance: Flu- due    Abnormal Screenings: none   Patient concerns: none   Nurse concerns: none   Next PCP appt: 03/27/2020 @ 11:20 am

## 2020-03-25 ENCOUNTER — Encounter: Payer: Self-pay | Admitting: Cardiovascular Disease

## 2020-03-25 ENCOUNTER — Ambulatory Visit: Payer: PPO | Admitting: Cardiovascular Disease

## 2020-03-25 ENCOUNTER — Other Ambulatory Visit: Payer: Self-pay

## 2020-03-25 VITALS — BP 141/61 | HR 63 | Ht 63.0 in | Wt 146.8 lb

## 2020-03-25 DIAGNOSIS — I10 Essential (primary) hypertension: Secondary | ICD-10-CM | POA: Diagnosis not present

## 2020-03-25 DIAGNOSIS — E119 Type 2 diabetes mellitus without complications: Secondary | ICD-10-CM

## 2020-03-25 DIAGNOSIS — E782 Mixed hyperlipidemia: Secondary | ICD-10-CM

## 2020-03-25 DIAGNOSIS — G4733 Obstructive sleep apnea (adult) (pediatric): Secondary | ICD-10-CM | POA: Diagnosis not present

## 2020-03-25 NOTE — Progress Notes (Signed)
Patient ID: Valerie Vargas, female   DOB: [birth year], 74 y.o.   MRN: [id number]    Cardiology Office Note    Date:  03/25/2020   ID:  Valerie Vargas [birth year], MRN [id number]  PCP:  Jinny Sanders, MD  Cardiologist:   Sanda Klein, MD   Chief Complaint  Patient presents with  . Coronary Artery Disease    History of Present Illness:  Valerie Vargas is a 74 y.o. female with OSA, a remote history of syncope in 2007 and negative subsequent workup, minor coronary artery disease by angiography in 2003, hypertension, hyperlipidemia, type 2 diabetes mellitus.   The patient specifically denies any chest pain at rest exertion, dyspnea at rest or with exertion, orthopnea, paroxysmal nocturnal dyspnea, syncope, palpitations, focal neurological deficits, intermittent claudication, lower extremity edema, unexplained weight gain, cough, hemoptysis or wheezing.  Her biggest problems have been related to a macular hole in her dominant right eye (her left eye has problems due to a childhood injury).  Even after cataract surgery she still only has 20/80 vision and she has been avoiding driving.  She has seen a retina specialist in hopes to see some improvement with  A repeat sleep study confirmed that she still has obstructive sleep apnea.  She is doing her best to use the CPAP, but continues have difficulty wearing it all night long.  Has a continuous pulse oximeter she wears all night long, with a vibratory overt that wakes her extreme desaturations.   She denies daytime hypersomnolence.  Continues have excellent glycemic control with hemoglobin A1c of 6.6%, fasting glucose 109 and generally good lipid profile with the exception of mild residual hypertriglyceridemia (even this is improved at 164).  HDL 49, LDL 55, total cholesterol 138.  Creatinine 0.92.  Borderline elevated blood pressure today, but at home this morning it was 125/59 mmHg.    In the past a tilt table test demonstrated that she  had orthostatic hypotension. Normal Vascuscreen (minimal carotid plaque, normal ABI, no AAA) in May 2018.    Past Medical History:  Diagnosis Date  . Allergy, unspecified not elsewhere classified   . Chronic interstitial cystitis   . Cough   . Esophageal reflux   . H/O syncope 2007   No recurrence;Tilt table 09/23/05-negative  . Hyperlipidemia 08/22/2014  . Hypertension   . Hypertriglyceridemia   . Irritable bowel syndrome   . Nonocclusive coronary atherosclerosis of native coronary artery   . Obstructive sleep apnea (adult) (pediatric)   . Paroxysmal ventricular tachycardia ([place])   . Type II or unspecified type diabetes mellitus without mention of complication, not stated as uncontrolled   . Unspecified asthma(493.90)   . Unspecified hypothyroidism   . Ventricular tachycardia [facility])     Past Surgical History:  Procedure Laterality Date  . ABDOMINAL HYSTERECTOMY  11/1979   for endometriosis  . CHOLECYSTECTOMY  07/2000  . ESOPHAGOGASTRODUODENOSCOPY ENDOSCOPY  2010    Outpatient Medications Prior to Visit  Medication Sig Dispense Refill  . ALPRAZolam (XANAX) 0.5 MG tablet TAKE 1 TABLET BY MOUTH EVERY DAY AS NEEDED 10 tablet 0  . Ascorbic Acid (VITAMIN C) 1000 MG tablet Take 1,000 mg by mouth daily.      . benzonatate (TESSALON) 100 MG capsule TAKE TWO CAPSULES BY MOUTH 3 TIMES A DAYAS NEEDED FOR COUGH 60 capsule 1  . Cholecalciferol (VITAMIN D3) 1000 UNITS CAPS Take 1 capsule by mouth daily.      . Diphenhydramine-PE-APAP (BENADRYL ALLERGY/SINUS HEADACH) 12.5-5-325 MG  TABS Take 0.5 tablets by mouth as needed.     . gabapentin (NEURONTIN) 100 MG tablet Take 300 mg by mouth 3 (three) times daily.      Marland Kitchen glucose blood (ONE TOUCH ULTRA TEST) test strip -TEST TWICE DAILY 100 each 6  . Guaifenesin 1200 MG TB12 Take by mouth as needed.     Marland Kitchen L-Theanine 100 MG CAPS Take 2 tablets by mouth as needed.     Marland Kitchen levothyroxine (SYNTHROID, LEVOTHROID) 75 MCG tablet Take 75 mcg by mouth daily before  breakfast.    . loratadine (CLARITIN) 10 MG tablet Take 10 mg by mouth daily.    Marland Kitchen MAGNESIUM GLYCINATE PLUS PO Take 1 capsule by mouth daily.     . meloxicam (MOBIC) 15 MG tablet Take 1 tablet (15 mg total) by mouth daily. 30 tablet 0  . metFORMIN (GLUCOPHAGE-XR) 500 MG 24 hr tablet     . metoprolol succinate (TOPROL-XL) 50 MG 24 hr tablet TAKE 1 TABLET BY MOUTH EVERY DAY (TAKE WITH OR IMMEDIATELY FOLLOWING A MEAL) 30 tablet 8  . metoprolol succinate (TOPROL-XL) 50 MG 24 hr tablet TAKE 1 TABLET BY MOUTH EVERY DAY 30 tablet 11  . nystatin (MYCOSTATIN) 100000 UNIT/ML suspension Take 5 mLs (500,000 Units total) by mouth 4 (four) times daily. Continue 3 days after thrush appears resolved 473 mL 0  . OMEGA-3 1000 MG CAPS Take 1,000 mg by mouth daily.      Glory Rosebush DELICA LANCETS 74J MISC -TEST TWICE DAILY 100 each 5  . promethazine-dextromethorphan (PROMETHAZINE-DM) 6.25-15 MG/5ML syrup Take 5 mLs by mouth 4 (four) times daily as needed for cough. 180 mL 0  . Pyridoxine HCl (VITAMIN B-6 PO) Take 1 tablet by mouth daily.    . rosuvastatin (CRESTOR) 10 MG tablet TAKE 1 TABLET BY MOUTH EVERY DAY FOR CHOLESTEROL 90 tablet 0  . scopolamine (TRANSDERM-SCOP, 1.5 MG,) 1 MG/3DAYS Place 1 patch (1.5 mg total) onto the skin every 3 (three) days. 4 patch 0  . Thiamine HCl (VITAMIN B-1) 250 MG tablet Take 250 mg by mouth daily.      . VENTOLIN HFA 108 (90 Base) MCG/ACT inhaler USE 2 PUFFS BY MOUTH EVERY 6 HOURS AS NEEDED FOR WHEEZING FOR SHORTNESS OF BREATH 18 g 2  . vitamin B-12 (CYANOCOBALAMIN) 1000 MCG tablet Take 1,000 mcg by mouth daily.       No facility-administered medications prior to visit.     Allergies:   Hctz [hydrochlorothiazide], Penicillins, and Sulfa antibiotics   Social History   Socioeconomic History  . Marital status: Married    Spouse name: Not on file  . Number of children: Not on file  . Years of education: Not on file  . Highest education level: Not on file  Occupational  History  . Occupation: retired  Tobacco Use  . Smoking status: Never Smoker  . Smokeless tobacco: Never Used  Vaping Use  . Vaping Use: Never used  Substance and Sexual Activity  . Alcohol use: No  . Drug use: No  . Sexual activity: Yes  Other Topics Concern  . Not on file  Social History Narrative  . Not on file   Social Determinants of Health   Financial Resource Strain:   . Difficulty of Paying Living Expenses: Not on file  Food Insecurity:   . Worried About Charity fundraiser in the Last Year: Not on file  . Ran Out of Food in the Last Year: Not on file  Transportation  Needs:   . Lack of Transportation (Medical): Not on file  . Lack of Transportation (Non-Medical): Not on file  Physical Activity:   . Days of Exercise per Week: Not on file  . Minutes of Exercise per Session: Not on file  Stress:   . Feeling of Stress : Not on file  Social Connections:   . Frequency of Communication with Friends and Family: Not on file  . Frequency of Social Gatherings with Friends and Family: Not on file  . Attends Religious Services: Not on file  . Active Member of Clubs or Organizations: Not on file  . Attends Archivist Meetings: Not on file  . Marital Status: Not on file     Family History:  The patient's family history includes Arthritis in her mother; Breast cancer in her maternal aunt, mother, and paternal aunt; Diabetes in her father and mother; Heart disease in her father; Leukemia in her brother; Stomach cancer in her father.   ROS:   Please see the history of present illness.    ROS  All other systems are reviewed and are negative  PHYSICAL EXAM:   VS:  BP (!) 141/61   Pulse 63   Ht 5\' 3"  (1.6 m)   Wt 146 lb 12.8 oz (66.6 kg)   SpO2 97%   BMI 26.00 kg/m      General: Alert, oriented x3, no distress, mildly overweight Head: no evidence of trauma, PERRL, EOMI, no exophtalmos or lid lag, no myxedema, no xanthelasma; normal ears, nose and  oropharynx Neck: normal jugular venous pulsations and no hepatojugular reflux; brisk carotid pulses without delay and no carotid bruits Chest: clear to auscultation, no signs of consolidation by percussion or palpation, normal fremitus, symmetrical and full respiratory excursions Cardiovascular: normal position and quality of the apical impulse, regular rhythm, normal first and second heart sounds, no murmurs, rubs or gallops Abdomen: no tenderness or distention, no masses by palpation, no abnormal pulsatility or arterial bruits, normal bowel sounds, no hepatosplenomegaly Extremities: no clubbing, cyanosis or edema; 2+ radial, ulnar and brachial pulses bilaterally; 2+ right femoral, posterior tibial and dorsalis pedis pulses; 2+ left femoral, posterior tibial and dorsalis pedis pulses; no subclavian or femoral bruits Neurological: grossly nonfocal Psych: Normal mood and affect  Wt Readings from Last 3 Encounters:  03/25/20 146 lb 12.8 oz (66.6 kg)  03/26/19 147 lb (66.7 kg)  02/19/19 143 lb (64.9 kg)     Studies/Labs Reviewed:   EKG:  EKG is ordered today.  It shows NSR, normal tracing  Recent Labs:  03/19/2020 (Dr. Chalmers Cater) hemoglobin A1c of 6.6%, fasting glucose 109  mild residual hypertriglyceridemia (even this is improved at 164).  HDL 49, LDL 55, total cholesterol 138.  Lipid Panel    Component Value Date/Time   CHOL 146 02/09/2018 1033   TRIG 238.0 (H) 02/09/2018 1033   HDL 50.80 02/09/2018 1033   CHOLHDL 3 02/09/2018 1033   VLDL 47.6 (H) 02/09/2018 1033   LDLCALC 71 10/30/2014 0925   LDLDIRECT 70.0 02/09/2018 1033   February 25, 2019 total cholesterol 140, triglyceride 191, HDL 46, LDL 56  ASSESSMENT:    1. Essential hypertension   2. Mixed hyperlipidemia   3. Diabetes mellitus type 2 in nonobese (HCC)   4. OSA (obstructive sleep apnea)      PLAN:  In order of problems listed above:  1. HTN: Well-controlled, often mild whitecoat hypertension. 2. HLP: All lipid  parameters remarkably improved with weight loss, even though mild  hypertriglyceridemia steadily getting better. 3. DM: Well-controlled mostly with lifestyle changes, low-dose Metformin 4. OSA: I again offered her a sleep clinic appointment Dr. Claiborne Billings to discuss improvement in CPAP compliance.  She elected to try mineral on for a while.     Medication Adjustments/Labs and Tests Ordered: Current medicines are reviewed at length with the patient today.  Concerns regarding medicines are outlined above.  Medication changes, Labs and Tests ordered today are listed in the Patient Instructions below. Patient Instructions  Medication Instructions:  No changes *If you need a refill on your cardiac medications before your next appointment, please call your pharmacy*   Lab Work: None ordered If you have labs (blood work) drawn today and your tests are completely normal, you will receive your results only by: Marland Kitchen MyChart Message (if you have MyChart) OR . A paper copy in the mail If you have any lab test that is abnormal or we need to change your treatment, we will call you to review the results.   Testing/Procedures: None ordered   Follow-Up: At [facility], you and your health needs are our priority.  As part of our continuing mission to provide you with exceptional heart care, we have created designated Provider Care Teams.  These Care Teams include your primary Cardiologist (physician) and Advanced Practice Providers (APPs -  Physician Assistants and Nurse Practitioners) who all work together to provide you with the care you need, when you need it.  We recommend signing up for the patient portal called "MyChart".  Sign up information is provided on this After Visit Summary.  MyChart is used to connect with patients for Virtual Visits (Telemedicine).  Patients are able to view lab/test results, encounter notes, upcoming appointments, etc.  Non-urgent messages can be sent to your provider as well.    To learn more about what you can do with MyChart, go to [email or web address].    Your next appointment:   12 month(s)  The format for your next appointment:   In Person  Provider:   You may see Sanda Klein, MD or one of the following Advanced Practice Providers on your designated Care Team:    Almyra Deforest, PA-C  Fabian Sharp, [place] or   Roby Lofts, PA-C         Signed, Sanda Klein, MD  03/25/2020 12:17 PM    [address], [place], [place]  [address] Phone: [phone]; Fax: [phone]

## 2020-03-25 NOTE — Patient Instructions (Signed)

## 2020-03-27 ENCOUNTER — Other Ambulatory Visit: Payer: Self-pay

## 2020-03-27 ENCOUNTER — Ambulatory Visit (INDEPENDENT_AMBULATORY_CARE_PROVIDER_SITE_OTHER): Payer: PPO | Admitting: Family Medicine

## 2020-03-27 ENCOUNTER — Encounter: Payer: Self-pay | Admitting: Family Medicine

## 2020-03-27 VITALS — BP 140/70 | HR 70 | Temp 97.8°F | Ht 62.5 in | Wt 144.0 lb

## 2020-03-27 DIAGNOSIS — E038 Other specified hypothyroidism: Secondary | ICD-10-CM | POA: Diagnosis not present

## 2020-03-27 DIAGNOSIS — I10 Essential (primary) hypertension: Secondary | ICD-10-CM

## 2020-03-27 DIAGNOSIS — Z23 Encounter for immunization: Secondary | ICD-10-CM

## 2020-03-27 DIAGNOSIS — E7800 Pure hypercholesterolemia, unspecified: Secondary | ICD-10-CM

## 2020-03-27 DIAGNOSIS — Z0000 Encounter for general adult medical examination without abnormal findings: Secondary | ICD-10-CM

## 2020-03-27 DIAGNOSIS — E119 Type 2 diabetes mellitus without complications: Secondary | ICD-10-CM | POA: Diagnosis not present

## 2020-03-27 LAB — HM DIABETES FOOT EXAM

## 2020-03-27 MED ORDER — BLOOD GLUCOSE MONITOR KIT: PACK | 0 refills | Status: AC

## 2020-03-27 NOTE — Assessment & Plan Note (Signed)
Stable, followed by Dr. Chalmers Cater ENDO.

## 2020-03-27 NOTE — Assessment & Plan Note (Signed)
:   Followed by Dr. Chalmers Cater.  Good control on metformin.

## 2020-03-27 NOTE — Progress Notes (Signed)
Chief Complaint  Patient presents with  . Annual Exam    Part 2    History of Present Illness: HPI   The patient presents for complete physical and review of chronic health problems. He/She also has the following acute concerns today: She has been seeing Dr. Iona Hansen at [facility] retinae for macular hole in right eye.  The patient saw a LPN or RN for medicare wellness visit.  Prevention and wellness was reviewed in detail. Note reviewed and important notes copied below. Health Maintenance: Flu- due Abnormal Screenings: none  03/27/20  Hypertension:  Recent elevations on metoprolol BP Readings from Last 3 Encounters:  03/27/20 140/70  03/25/20 (!) 141/61  03/26/19 (!) 162/84  Using medication without problems or lightheadedness: none Chest pain with exertion:none Edema:none Short of breath: none Average home BPs: 125/56 at home. Other issues:  DM: Followed by Dr. Chalmers Cater.  Good control on metformin BID. FBS 109  A1C 6.6 Lab Results  Component Value Date   HGBA1C 6.3 02/20/2019   Elevated Cholesterol: LDL at goal on crestor previously.   HDL 49, LDL 55 Using medications without problems:none Muscle aches: none Diet compliance: good Exercise:limited some with eye issues.Marland Kitchen getting back to walking Other complaints:    This visit occurred during the SARS-CoV-2 public health emergency.  Safety protocols were in place, including screening questions prior to the visit, additional usage of staff PPE, and extensive cleaning of exam room while observing appropriate contact time as indicated for disinfecting solutions.   COVID 19 screen:  No recent travel or known exposure to COVID19 The patient denies respiratory symptoms of COVID 19 at this time. The importance of social distancing was discussed today.     Review of Systems  Constitutional: Negative for chills and fever.  HENT: Negative for congestion and ear pain.   Eyes: Negative for pain and redness.   Respiratory: Negative for cough and shortness of breath.   Cardiovascular: Negative for chest pain, palpitations and leg swelling.  Gastrointestinal: Negative for abdominal pain, blood in stool, constipation, diarrhea, nausea and vomiting.  Genitourinary: Negative for dysuria.  Musculoskeletal: Negative for falls and myalgias.  Skin: Negative for rash.  Neurological: Negative for dizziness.  Psychiatric/Behavioral: Negative for depression. The patient is not nervous/anxious.       Past Medical History:  Diagnosis Date  . Allergy, unspecified not elsewhere classified   . Chronic interstitial cystitis   . Cough   . Esophageal reflux   . H/O syncope 2007   No recurrence;Tilt table 09/23/05-negative  . Hyperlipidemia 08/22/2014  . Hypertension   . Hypertriglyceridemia   . Irritable bowel syndrome   . Nonocclusive coronary atherosclerosis of native coronary artery   . Obstructive sleep apnea (adult) (pediatric)   . Paroxysmal ventricular tachycardia ([place])   . Type II or unspecified type diabetes mellitus without mention of complication, not stated as uncontrolled   . Unspecified asthma(493.90)   . Unspecified hypothyroidism   . Ventricular tachycardia ([place])     reports that she has never smoked. She has never used smokeless tobacco. She reports that she does not drink alcohol and does not use drugs.   Current Outpatient Medications:  .  ALPRAZolam (XANAX) 0.5 MG tablet, TAKE 1 TABLET BY MOUTH EVERY DAY AS NEEDED, Disp: 10 tablet, Rfl: 0 .  Ascorbic Acid (VITAMIN C) 1000 MG tablet, Take 1,000 mg by mouth daily.  , Disp: , Rfl:  .  benzonatate (TESSALON) 100 MG capsule, TAKE TWO CAPSULES BY MOUTH 3 TIMES A  DAYAS NEEDED FOR COUGH, Disp: 60 capsule, Rfl: 1 .  Cholecalciferol (VITAMIN D3) 1000 UNITS CAPS, Take 1 capsule by mouth daily.  , Disp: , Rfl:  .  Diphenhydramine-PE-APAP (BENADRYL ALLERGY/SINUS HEADACH) 12.5-5-325 MG TABS, Take 0.5 tablets by mouth as needed. , Disp: , Rfl:  .   gabapentin (NEURONTIN) 100 MG tablet, Take 300 mg by mouth 3 (three) times daily.  , Disp: , Rfl:  .  glucose blood (ONE TOUCH ULTRA TEST) test strip, -TEST TWICE DAILY, Disp: 100 each, Rfl: 6 .  Guaifenesin 1200 MG TB12, Take by mouth as needed. , Disp: , Rfl:  .  L-Theanine 100 MG CAPS, Take 2 tablets by mouth as needed. , Disp: , Rfl:  .  levothyroxine (SYNTHROID, LEVOTHROID) 75 MCG tablet, Take 75 mcg by mouth daily before breakfast., Disp: , Rfl:  .  loratadine (CLARITIN) 10 MG tablet, Take 10 mg by mouth daily., Disp: , Rfl:  .  MAGNESIUM GLYCINATE PLUS PO, Take 1 capsule by mouth daily. , Disp: , Rfl:  .  metFORMIN (GLUCOPHAGE-XR) 500 MG 24 hr tablet, , Disp: , Rfl:  .  metoprolol succinate (TOPROL-XL) 50 MG 24 hr tablet, TAKE 1 TABLET BY MOUTH EVERY DAY, Disp: 30 tablet, Rfl: 11 .  nystatin (MYCOSTATIN) 100000 UNIT/ML suspension, Take 5 mLs (500,000 Units total) by mouth 4 (four) times daily. Continue 3 days after thrush appears resolved, Disp: 473 mL, Rfl: 0 .  OMEGA-3 1000 MG CAPS, Take 1,000 mg by mouth daily.  , Disp: , Rfl:  .  ONETOUCH DELICA LANCETS 57Q MISC, -TEST TWICE DAILY, Disp: 100 each, Rfl: 5 .  promethazine-dextromethorphan (PROMETHAZINE-DM) 6.25-15 MG/5ML syrup, Take 5 mLs by mouth 4 (four) times daily as needed for cough., Disp: 180 mL, Rfl: 0 .  Pyridoxine HCl (VITAMIN B-6 PO), Take 1 tablet by mouth daily., Disp: , Rfl:  .  rosuvastatin (CRESTOR) 10 MG tablet, TAKE 1 TABLET BY MOUTH EVERY DAY FOR CHOLESTEROL, Disp: 90 tablet, Rfl: 0 .  scopolamine (TRANSDERM-SCOP, 1.5 MG,) 1 MG/3DAYS, Place 1 patch (1.5 mg total) onto the skin every 3 (three) days., Disp: 4 patch, Rfl: 0 .  Thiamine HCl (VITAMIN B-1) 250 MG tablet, Take 250 mg by mouth daily.  , Disp: , Rfl:  .  VENTOLIN HFA 108 (90 Base) MCG/ACT inhaler, USE 2 PUFFS BY MOUTH EVERY 6 HOURS AS NEEDED FOR WHEEZING FOR SHORTNESS OF BREATH, Disp: 18 g, Rfl: 2 .  vitamin B-12 (CYANOCOBALAMIN) 1000 MCG tablet, Take 1,000 mcg  by mouth daily.  , Disp: , Rfl:    Observations/Objective: Blood pressure 140/70, pulse 70, temperature 97.8 F (36.6 C), temperature source Temporal, height 5' 2.5" (1.588 m), weight 144 lb (65.3 kg), SpO2 97 %.  Physical Exam   Diabetic foot exam: Normal inspection No skin breakdown No calluses  Normal DP pulses Normal sensation to light touch and monofilament Nails normal  Assessment and Plan   The patient's preventative maintenance and recommended screening tests for an annual wellness exam were reviewed in full today. Brought up to date unless services declined.  Counselled on the importance of diet, exercise, and its role in overall health and mortality. The patient's FH and SH was reviewed, including their home life, tobacco status, and drug and alcohol status.   Colon: 03/15/2013 nml.. Due for 10 year repeat in 2024. Nonsmoker DEXA: Normal 04/2017, can repeat in 5 years. Mammogram last 08/2019 PAP not indicated, DVE not indicated total hysterecotmy Vaccine: uptodate , COVID19 series, S/  P flu shot. Hep C neg  Diabetes mellitus with no complication : Followed by Dr. Chalmers Cater.  Good control on metformin.  HTN (hypertension) Borderline high on current regimen but better control at home.  Hypothyroidism Stable, followed by Dr. Chalmers Cater ENDO.  High cholesterol LDL at goal on crestor.   Eliezer Lofts, MD

## 2020-03-27 NOTE — Assessment & Plan Note (Addendum)
Borderline high on current regimen but better control at home.

## 2020-03-27 NOTE — Assessment & Plan Note (Signed)
LDL at goal on crestor. 

## 2020-03-31 ENCOUNTER — Other Ambulatory Visit: Payer: Self-pay | Admitting: Family Medicine

## 2020-03-31 ENCOUNTER — Other Ambulatory Visit: Payer: Self-pay | Admitting: Cardiovascular Disease

## 2020-03-31 DIAGNOSIS — R7989 Other specified abnormal findings of blood chemistry: Secondary | ICD-10-CM | POA: Diagnosis not present

## 2020-03-31 DIAGNOSIS — I10 Essential (primary) hypertension: Secondary | ICD-10-CM | POA: Diagnosis not present

## 2020-03-31 DIAGNOSIS — I951 Orthostatic hypotension: Secondary | ICD-10-CM | POA: Diagnosis not present

## 2020-03-31 DIAGNOSIS — E7800 Pure hypercholesterolemia, unspecified: Secondary | ICD-10-CM | POA: Diagnosis not present

## 2020-03-31 DIAGNOSIS — E1165 Type 2 diabetes mellitus with hyperglycemia: Secondary | ICD-10-CM | POA: Diagnosis not present

## 2020-03-31 DIAGNOSIS — E039 Hypothyroidism, unspecified: Secondary | ICD-10-CM | POA: Diagnosis not present

## 2020-03-31 DIAGNOSIS — R5383 Other fatigue: Secondary | ICD-10-CM | POA: Diagnosis not present

## 2020-03-31 DIAGNOSIS — D649 Anemia, unspecified: Secondary | ICD-10-CM | POA: Diagnosis not present

## 2020-07-02 DIAGNOSIS — H35372 Puckering of macula, left eye: Secondary | ICD-10-CM | POA: Diagnosis not present

## 2020-07-02 DIAGNOSIS — H43822 Vitreomacular adhesion, left eye: Secondary | ICD-10-CM | POA: Diagnosis not present

## 2020-07-04 DIAGNOSIS — J209 Acute bronchitis, unspecified: Secondary | ICD-10-CM | POA: Diagnosis not present

## 2020-07-07 DIAGNOSIS — Z961 Presence of intraocular lens: Secondary | ICD-10-CM | POA: Diagnosis not present

## 2020-08-17 ENCOUNTER — Other Ambulatory Visit: Payer: Self-pay | Admitting: Family Medicine

## 2020-08-17 DIAGNOSIS — Z1231 Encounter for screening mammogram for malignant neoplasm of breast: Secondary | ICD-10-CM

## 2020-09-02 DIAGNOSIS — J383 Other diseases of vocal cords: Secondary | ICD-10-CM | POA: Diagnosis not present

## 2020-09-02 DIAGNOSIS — J387 Other diseases of larynx: Secondary | ICD-10-CM | POA: Diagnosis not present

## 2020-09-02 DIAGNOSIS — R053 Chronic cough: Secondary | ICD-10-CM | POA: Diagnosis not present

## 2020-09-03 ENCOUNTER — Ambulatory Visit (INDEPENDENT_AMBULATORY_CARE_PROVIDER_SITE_OTHER): Payer: PPO | Admitting: Family Medicine

## 2020-09-03 ENCOUNTER — Other Ambulatory Visit: Payer: Self-pay

## 2020-09-03 VITALS — BP 142/90 | HR 84 | Temp 98.0°F | Ht 62.5 in | Wt 146.5 lb

## 2020-09-03 DIAGNOSIS — R197 Diarrhea, unspecified: Secondary | ICD-10-CM | POA: Insufficient documentation

## 2020-09-03 DIAGNOSIS — M549 Dorsalgia, unspecified: Secondary | ICD-10-CM | POA: Insufficient documentation

## 2020-09-03 DIAGNOSIS — R140 Abdominal distension (gaseous): Secondary | ICD-10-CM | POA: Diagnosis not present

## 2020-09-03 DIAGNOSIS — R1031 Right lower quadrant pain: Secondary | ICD-10-CM | POA: Insufficient documentation

## 2020-09-03 LAB — COMPREHENSIVE METABOLIC PANEL
ALT: 24 U/L (ref 0–35)
AST: 32 U/L (ref 0–37)
Albumin: 4.6 g/dL (ref 3.5–5.2)
Alkaline Phosphatase: 43 U/L (ref 39–117)
BUN: 9 mg/dL (ref 6–23)
CO2: 28 mEq/L (ref 19–32)
Calcium: 9.7 mg/dL (ref 8.4–10.5)
Chloride: 104 mEq/L (ref 96–112)
Creatinine, Ser: 0.82 mg/dL (ref 0.40–1.20)
GFR: 69.58 mL/min (ref >60.00)
Glucose, Bld: 103 mg/dL — ABNORMAL HIGH (ref 70–99)
Potassium: 4.8 mEq/L (ref 3.5–5.1)
Sodium: 138 mEq/L (ref 135–145)
Total Bilirubin: 0.5 mg/dL (ref 0.2–1.2)
Total Protein: 7.4 g/dL (ref 6.0–8.3)

## 2020-09-03 LAB — CBC WITH DIFFERENTIAL/PLATELET
Basophils Absolute: 0.1 10*3/uL (ref 0.0–0.1)
Basophils Relative: 0.7 % (ref 0.0–3.0)
Eosinophils Absolute: 0.1 10*3/uL (ref 0.0–0.7)
Eosinophils Relative: 1.4 % (ref 0.0–5.0)
HCT: 37.4 % (ref 36.0–46.0)
Hemoglobin: 12.7 g/dL (ref 12.0–15.0)
Lymphocytes Relative: 29 % (ref 12.0–46.0)
Lymphs Abs: 2.1 10*3/uL (ref 0.7–4.0)
MCHC: 34 g/dL (ref 30.0–36.0)
MCV: 87.8 fl (ref 78.0–100.0)
Monocytes Absolute: 0.4 10*3/uL (ref 0.1–1.0)
Monocytes Relative: 5.2 % (ref 3.0–12.0)
Neutro Abs: 4.6 10*3/uL (ref 1.4–7.7)
Neutrophils Relative %: 63.7 % (ref 43.0–77.0)
Platelets: 206 10*3/uL (ref 150.0–400.0)
RBC: 4.26 Mil/uL (ref 3.87–5.11)
RDW: 12.6 % (ref 11.5–15.5)
WBC: 7.2 10*3/uL (ref 4.0–10.5)

## 2020-09-03 LAB — LIPASE: Lipase: 47 U/L (ref 11.0–59.0)

## 2020-09-03 MED ORDER — METRONIDAZOLE 500 MG PO TABS: 500.0000 mg | ORAL_TABLET | Freq: Two times a day (BID) | ORAL | 0 refills | Status: DC

## 2020-09-03 NOTE — Patient Instructions (Signed)
Please stop at the lab to have labs drawn.  If lab eval negative.. will proceed with metronidazole treatment for bacterial overgrowth syndrome.

## 2020-09-03 NOTE — Progress Notes (Addendum)
Patient ID: Valerie Vargas, female    DOB: [birth year], 75 y.o.   MRN: [id number]  This visit was conducted in person.  BP (!) 142/90   Pulse 84   Temp 98 F (36.7 C) (Temporal)   Ht 5' 2.5" (1.588 m)   Wt 146 lb 8 oz (66.5 kg)   SpO2 98%   BMI 26.37 kg/m    CC:  Chief Complaint  Patient presents with  . Diarrhea    Last episode 2/14. No appetite. Only eating bland foods. HX of over active bacteria in intestine.   . Abdominal Pain    Lower right- off and on x 2-3 months. Constant x last month.     Subjective:   HPI: Valerie Vargas is a 75 y.o. female presenting on 09/03/2020 for Diarrhea (Last episode 2/14. No appetite. Only eating bland foods. HX of over active bacteria in intestine. ) and Abdominal Pain (Lower right- off and on x 2-3 months. Constant x last month. )  RLQ pain off and on x 2-3 months,  ach pain Diarrhea vs constipation episodes  off and on for  Almost 1 year.  Not better despite metamucil, probiotic.  Last diarrhea episode in  2/14.Marland Kitchen decreased appetite  Only able to eat rice potatoes.  having gas and burping.. pain left upper back .. relieved with burp.    Cannot see  [place] MD until 10/2020  reviewed last OV 02/2013 when treated for recurrence of bacterial overgrowth.  No new meds.  No sick contacts.  No well water No blood in stool.   ENDO checked vitamins levels.. normal, A1C 6.3   Father with stomach cancer.  Hx of overactive bacteria in stool. 2010... treated with antibiotics.Marland Kitchen resolved.  Dx with methane test.  In past had similar symptoms.. RLQ pain  Wt Readings from Last 3 Encounters:  09/03/20 146 lb 8 oz (66.5 kg)  03/27/20 144 lb (65.3 kg)  03/25/20 146 lb 12.8 oz (66.6 kg)       Relevant past medical, surgical, family and social history reviewed and updated as indicated. Interim medical history since our last visit reviewed. Allergies and medications reviewed and updated. Outpatient Medications Prior to Visit   Medication Sig Dispense Refill  . ALPRAZolam (XANAX) 0.5 MG tablet TAKE 1 TABLET BY MOUTH EVERY DAY AS NEEDED 10 tablet 0  . Ascorbic Acid (VITAMIN C) 1000 MG tablet Take 1,000 mg by mouth daily.    . benzonatate (TESSALON) 100 MG capsule TAKE TWO CAPSULES BY MOUTH 3 TIMES A DAYAS NEEDED FOR COUGH 60 capsule 1  . blood glucose meter kit and supplies KIT Dispense based on patient and insurance preference. Use up to four times daily as directed. (FOR ICD-9 250.00, 250.01). 1 each 0  . Cholecalciferol (VITAMIN D3) 1000 UNITS CAPS Take 1 capsule by mouth daily.    . diphenhydrAMINE-PE-APAP 12.5-5-325 MG TABS Take 0.5 tablets by mouth as needed.    . gabapentin (NEURONTIN) 100 MG tablet Take 300 mg by mouth 3 (three) times daily.    Marland Kitchen glucose blood (ONE TOUCH ULTRA TEST) test strip -TEST TWICE DAILY 100 each 6  . Guaifenesin 1200 MG TB12 Take by mouth as needed.    Marland Kitchen L-Theanine 100 MG CAPS Take 2 tablets by mouth as needed.    Marland Kitchen levothyroxine (SYNTHROID, LEVOTHROID) 75 MCG tablet Take 75 mcg by mouth daily before breakfast.    . loratadine (CLARITIN) 10 MG tablet Take 10 mg by mouth daily.    Marland Kitchen  MAGNESIUM GLYCINATE PLUS PO Take 1 capsule by mouth daily.    . metFORMIN (GLUCOPHAGE-XR) 500 MG 24 hr tablet     . metoprolol succinate (TOPROL-XL) 50 MG 24 hr tablet TAKE 1 TABLET BY MOUTH EVERY DAY 30 tablet 11  . nystatin (MYCOSTATIN) 100000 UNIT/ML suspension Take 5 mLs (500,000 Units total) by mouth 4 (four) times daily. Continue 3 days after thrush appears resolved 473 mL 0  . OMEGA-3 1000 MG CAPS Take 1,000 mg by mouth daily.    Glory Rosebush DELICA LANCETS 76E MISC -TEST TWICE DAILY 100 each 5  . promethazine-dextromethorphan (PROMETHAZINE-DM) 6.25-15 MG/5ML syrup Take 5 mLs by mouth 4 (four) times daily as needed for cough. 180 mL 0  . Pyridoxine HCl (VITAMIN B-6 PO) Take 1 tablet by mouth daily.    . rosuvastatin (CRESTOR) 10 MG tablet TAKE 1 TABLET BY MOUTH EVERY DAY FOR CHOLESTEROL 90 tablet 3  .  scopolamine (TRANSDERM-SCOP, 1.5 MG,) 1 MG/3DAYS Place 1 patch (1.5 mg total) onto the skin every 3 (three) days. 4 patch 0  . Thiamine HCl (VITAMIN B-1) 250 MG tablet Take 250 mg by mouth daily.    . VENTOLIN HFA 108 (90 Base) MCG/ACT inhaler USE 2 PUFFS BY MOUTH EVERY 6 HOURS AS NEEDED FOR WHEEZING FOR SHORTNESS OF BREATH 18 g 2  . vitamin B-12 (CYANOCOBALAMIN) 1000 MCG tablet Take 1,000 mcg by mouth daily.     No facility-administered medications prior to visit.     Per HPI unless specifically indicated in ROS section below Review of Systems  Constitutional: Negative for fatigue and fever.  HENT: Negative for congestion.   Eyes: Negative for pain.  Respiratory: Negative for cough and shortness of breath.   Cardiovascular: Negative for chest pain, palpitations and leg swelling.  Gastrointestinal: Positive for abdominal distention, abdominal pain, constipation and diarrhea. Negative for nausea.  Genitourinary: Negative for dysuria and vaginal bleeding.  Musculoskeletal: Negative for back pain.  Neurological: Negative for syncope, light-headedness and headaches.  Psychiatric/Behavioral: Negative for dysphoric mood.   Objective:  BP (!) 142/90   Pulse 84   Temp 98 F (36.7 C) (Temporal)   Ht 5' 2.5" (1.588 m)   Wt 146 lb 8 oz (66.5 kg)   SpO2 98%   BMI 26.37 kg/m   Wt Readings from Last 3 Encounters:  09/03/20 146 lb 8 oz (66.5 kg)  03/27/20 144 lb (65.3 kg)  03/25/20 146 lb 12.8 oz (66.6 kg)      Physical Exam Constitutional:      General: She is not in acute distress.Vital signs are normal.     Appearance: Normal appearance. She is well-developed and well-nourished. She is not ill-appearing or toxic-appearing.  HENT:     Head: Normocephalic.     Right Ear: Hearing, tympanic membrane, ear canal and external ear normal. Tympanic membrane is not erythematous, retracted or bulging.     Left Ear: Hearing, tympanic membrane, ear canal and external ear normal. Tympanic membrane  is not erythematous, retracted or bulging.     Nose: No mucosal edema or rhinorrhea.     Right Sinus: No maxillary sinus tenderness or frontal sinus tenderness.     Left Sinus: No maxillary sinus tenderness or frontal sinus tenderness.     Mouth/Throat:     Mouth: Oropharynx is clear and moist and mucous membranes are normal.     Pharynx: Uvula midline.  Eyes:     General: Lids are normal. Lids are everted, no foreign bodies appreciated.  Extraocular Movements: EOM normal.     Conjunctiva/sclera: Conjunctivae normal.     Pupils: Pupils are equal, round, and reactive to light.  Neck:     Thyroid: No thyroid mass or thyromegaly.     Vascular: No carotid bruit.     Trachea: Trachea normal.  Cardiovascular:     Rate and Rhythm: Normal rate and regular rhythm.     Pulses: Normal pulses and intact distal pulses.     Heart sounds: Normal heart sounds, S1 normal and S2 normal. No murmur heard. No friction rub. No gallop.   Pulmonary:     Effort: Pulmonary effort is normal. No tachypnea or respiratory distress.     Breath sounds: Normal breath sounds. No decreased breath sounds, wheezing, rhonchi or rales.  Abdominal:     General: Abdomen is protuberant. Bowel sounds are increased. There is distension.     Palpations: Abdomen is soft. There is no fluid wave.     Tenderness: There is abdominal tenderness in the right lower quadrant and epigastric area.  Musculoskeletal:     Cervical back: Normal range of motion and neck supple.  Skin:    General: Skin is warm, dry and intact.     Findings: No rash.  Neurological:     Mental Status: She is alert.  Psychiatric:        Mood and Affect: Mood is not anxious or depressed.        Speech: Speech normal.        Behavior: Behavior normal. Behavior is cooperative.        Thought Content: Thought content normal.        Cognition and Memory: Cognition and memory normal.        Judgment: Judgment normal.       Results for orders placed or  performed in visit on 19/37/90  Basic metabolic panel  Result Value Ref Range   Glucose 109    BUN 13 4 - 21   CO2 23 (A) 13 - 22   Creatinine 0.9 0.5 - 1.1   Potassium 4.8 3.4 - 5.3   Sodium 141 137 - 147   Chloride 103 99 - 108  Comprehensive metabolic panel  Result Value Ref Range   GFR calc Af Amer 70    GFR calc non Af Amer 61    Calcium 9.7 8.7 - 10.7  Lipid panel  Result Value Ref Range   Triglycerides 164 (A) 40 - 160   Cholesterol 131 0 - 200   HDL 49 35 - 70   LDL Cholesterol 55   Hepatic function panel  Result Value Ref Range   Alkaline Phosphatase 56 25 - 125   ALT 27 7 - 35   AST 33 13 - 35  Hemoglobin A1c  Result Value Ref Range   Hemoglobin A1C 6.6   HM DIABETES FOOT EXAM  Result Value Ref Range   HM Diabetic Foot Exam done     This visit occurred during the SARS-CoV-2 public health emergency.  Safety protocols were in place, including screening questions prior to the visit, additional usage of staff PPE, and extensive cleaning of exam room while observing appropriate contact time as indicated for disinfecting solutions.   COVID 19 screen:  No recent travel or known exposure to COVID19 The patient denies respiratory symptoms of COVID 19 at this time. The importance of social distancing was discussed today.   Assessment and Plan Bloating , diarrhea and RLQ abdominal pain, with intermittent  right sided pain in back:  DDx: bacterial overgrowth,  Infectious diarrhea Less likely pancreatitis, S/P TAH ( no ovaries), diverticulitis  Eval with labs to rule out Cdiff, other GI infecitons, including cbc, CMET lipase, GI panel and Cdfif test.   If negative eval will treat with metronidazole as she has been treat in past in 2014 per GI records for similar symptoms. If not improving consider abd CT . Encouraged her to keep appt for GI as planned in 10/2020.  Meds ordered this encounter  Medications  . metroNIDAZOLE (FLAGYL) 500 MG tablet    Sig: Take 1 tablet  (500 mg total) by mouth 2 (two) times daily. Will pick up    Dispense:  20 tablet    Refill:  0    Orders Placed This Encounter  Procedures  . Comprehensive metabolic panel  . CBC with Differential/Platelet  . Lipase  . C. difficile GDH and Toxin A/B    Standing Status:   Future    Standing Expiration Date:   03/03/2021  . Gastrointestinal Pathogen Panel PCR    Standing Status:   Future    Standing Expiration Date:   03/03/2021       Eliezer Lofts, MD

## 2020-09-08 ENCOUNTER — Other Ambulatory Visit (INDEPENDENT_AMBULATORY_CARE_PROVIDER_SITE_OTHER): Payer: PPO

## 2020-09-08 DIAGNOSIS — R140 Abdominal distension (gaseous): Secondary | ICD-10-CM

## 2020-09-08 DIAGNOSIS — R1031 Right lower quadrant pain: Secondary | ICD-10-CM | POA: Diagnosis not present

## 2020-09-08 DIAGNOSIS — R197 Diarrhea, unspecified: Secondary | ICD-10-CM

## 2020-09-08 DIAGNOSIS — M549 Dorsalgia, unspecified: Secondary | ICD-10-CM | POA: Diagnosis not present

## 2020-09-11 LAB — GASTROINTESTINAL PATHOGEN PANEL PCR
C. difficile Tox A/B, PCR: NOT DETECTED
Campylobacter, PCR: NOT DETECTED
Cryptosporidium, PCR: NOT DETECTED
E coli (ETEC) LT/ST PCR: NOT DETECTED
E coli (STEC) stx1/stx2, PCR: NOT DETECTED
E coli 0157, PCR: NOT DETECTED
Giardia lamblia, PCR: NOT DETECTED
Norovirus, PCR: NOT DETECTED
Rotavirus A, PCR: NOT DETECTED
Salmonella, PCR: NOT DETECTED
Shigella, PCR: NOT DETECTED

## 2020-09-11 LAB — C. DIFFICILE GDH AND TOXIN A/B
GDH ANTIGEN: NOT DETECTED
MICRO NUMBER:: 11564229
SPECIMEN QUALITY:: ADEQUATE
TOXIN A AND B: NOT DETECTED

## 2020-09-30 ENCOUNTER — Ambulatory Visit: Payer: PPO

## 2020-10-09 ENCOUNTER — Other Ambulatory Visit: Payer: Self-pay | Admitting: *Deleted

## 2020-10-30 DIAGNOSIS — R490 Dysphonia: Secondary | ICD-10-CM | POA: Diagnosis not present

## 2020-10-30 DIAGNOSIS — J383 Other diseases of vocal cords: Secondary | ICD-10-CM | POA: Diagnosis not present

## 2020-10-30 DIAGNOSIS — R053 Chronic cough: Secondary | ICD-10-CM | POA: Diagnosis not present

## 2020-11-09 ENCOUNTER — Other Ambulatory Visit: Payer: Self-pay | Admitting: Cardiovascular Disease

## 2020-11-09 DIAGNOSIS — Z961 Presence of intraocular lens: Secondary | ICD-10-CM | POA: Diagnosis not present

## 2020-11-09 DIAGNOSIS — H43812 Vitreous degeneration, left eye: Secondary | ICD-10-CM | POA: Diagnosis not present

## 2020-11-09 DIAGNOSIS — H04123 Dry eye syndrome of bilateral lacrimal glands: Secondary | ICD-10-CM | POA: Diagnosis not present

## 2020-11-09 DIAGNOSIS — H5310 Unspecified subjective visual disturbances: Secondary | ICD-10-CM | POA: Diagnosis not present

## 2020-11-10 DIAGNOSIS — R140 Abdominal distension (gaseous): Secondary | ICD-10-CM | POA: Diagnosis not present

## 2020-11-10 DIAGNOSIS — R110 Nausea: Secondary | ICD-10-CM | POA: Diagnosis not present

## 2020-11-10 DIAGNOSIS — Z800 Family history of malignant neoplasm of digestive organs: Secondary | ICD-10-CM | POA: Diagnosis not present

## 2020-11-10 DIAGNOSIS — R6881 Early satiety: Secondary | ICD-10-CM | POA: Diagnosis not present

## 2020-11-10 DIAGNOSIS — Z8719 Personal history of other diseases of the digestive system: Secondary | ICD-10-CM | POA: Diagnosis not present

## 2020-11-10 DIAGNOSIS — R194 Change in bowel habit: Secondary | ICD-10-CM | POA: Diagnosis not present

## 2020-11-10 DIAGNOSIS — R1011 Right upper quadrant pain: Secondary | ICD-10-CM | POA: Diagnosis not present

## 2020-11-10 DIAGNOSIS — R159 Full incontinence of feces: Secondary | ICD-10-CM | POA: Diagnosis not present

## 2020-11-13 DIAGNOSIS — J383 Other diseases of vocal cords: Secondary | ICD-10-CM | POA: Diagnosis not present

## 2020-11-13 DIAGNOSIS — R053 Chronic cough: Secondary | ICD-10-CM | POA: Diagnosis not present

## 2020-11-13 DIAGNOSIS — J387 Other diseases of larynx: Secondary | ICD-10-CM | POA: Diagnosis not present

## 2020-11-13 DIAGNOSIS — Z800 Family history of malignant neoplasm of digestive organs: Secondary | ICD-10-CM | POA: Diagnosis not present

## 2020-11-13 DIAGNOSIS — R490 Dysphonia: Secondary | ICD-10-CM | POA: Diagnosis not present

## 2020-11-13 DIAGNOSIS — R1011 Right upper quadrant pain: Secondary | ICD-10-CM | POA: Diagnosis not present

## 2020-11-23 ENCOUNTER — Ambulatory Visit
Admission: RE | Admit: 2020-11-23 | Discharge: 2020-11-23 | Disposition: A | Discharge status: Home / Self Care | Payer: PPO | Source: Ambulatory Visit | Attending: Family Medicine | Admitting: Family Medicine

## 2020-11-23 ENCOUNTER — Other Ambulatory Visit: Payer: Self-pay

## 2020-11-23 DIAGNOSIS — Z1231 Encounter for screening mammogram for malignant neoplasm of breast: Secondary | ICD-10-CM | POA: Diagnosis not present

## 2020-12-07 DIAGNOSIS — H43812 Vitreous degeneration, left eye: Secondary | ICD-10-CM | POA: Diagnosis not present

## 2020-12-07 LAB — HM DIABETES EYE EXAM

## 2020-12-24 ENCOUNTER — Telehealth: Payer: Self-pay | Admitting: Family Medicine

## 2020-12-24 NOTE — Chronic Care Management (AMB) (Signed)
  Chronic Care Management   Outreach Note  12/24/2020 Name: FELICIANA NARAYAN MRN: [id number] DOB: 07-25-1945  Referred by: Jinny Sanders, MD Reason for referral : No chief complaint on file.   An unsuccessful telephone outreach was attempted today. The patient was referred to the pharmacist for assistance with care management and care coordination.   Follow Up Plan:   Tatjana Dellinger Upstream Scheduler

## 2020-12-28 ENCOUNTER — Telehealth: Payer: Self-pay | Admitting: Family Medicine

## 2020-12-28 NOTE — Progress Notes (Signed)
ERROR

## 2020-12-28 NOTE — Chronic Care Management (AMB) (Signed)
  Chronic Care Management   Note  12/28/2020 Name: JIMMA ORTMAN MRN: [id number] DOB: January 24, 1946  AKEMI OVERHOLSER is a 75 y.o. year old female who is a primary care patient of Bedsole, Amy E, MD. I reached out to Rebekah Chesterfield by phone today in response to a referral sent by Ms. Ellard Artis PCP, Jinny Sanders, MD.   Ms. Wanless was given information about Chronic Care Management services today including:  CCM service includes personalized support from designated clinical staff supervised by her physician, including individualized plan of care and coordination with other care providers 24/7 contact phone numbers for assistance for urgent and routine care needs. Service will only be billed when office clinical staff spend 20 minutes or more in a month to coordinate care. Only one practitioner may furnish and bill the service in a calendar month. The patient may stop CCM services at any time (effective at the end of the month) by phone call to the office staff.   Patient agreed to services and verbal consent obtained.   Follow up plan:   Tatjana Secretary/administrator

## 2020-12-31 DIAGNOSIS — H35033 Hypertensive retinopathy, bilateral: Secondary | ICD-10-CM | POA: Diagnosis not present

## 2020-12-31 DIAGNOSIS — H43812 Vitreous degeneration, left eye: Secondary | ICD-10-CM | POA: Diagnosis not present

## 2020-12-31 DIAGNOSIS — H35372 Puckering of macula, left eye: Secondary | ICD-10-CM | POA: Diagnosis not present

## 2020-12-31 DIAGNOSIS — H43822 Vitreomacular adhesion, left eye: Secondary | ICD-10-CM | POA: Diagnosis not present

## 2021-01-06 DIAGNOSIS — R1084 Generalized abdominal pain: Secondary | ICD-10-CM | POA: Diagnosis not present

## 2021-01-06 DIAGNOSIS — K5900 Constipation, unspecified: Secondary | ICD-10-CM | POA: Diagnosis not present

## 2021-01-06 DIAGNOSIS — K5901 Slow transit constipation: Secondary | ICD-10-CM | POA: Diagnosis not present

## 2021-01-06 DIAGNOSIS — K6289 Other specified diseases of anus and rectum: Secondary | ICD-10-CM | POA: Diagnosis not present

## 2021-01-06 DIAGNOSIS — R140 Abdominal distension (gaseous): Secondary | ICD-10-CM | POA: Diagnosis not present

## 2021-01-06 DIAGNOSIS — R142 Eructation: Secondary | ICD-10-CM | POA: Diagnosis not present

## 2021-01-06 DIAGNOSIS — R110 Nausea: Secondary | ICD-10-CM | POA: Diagnosis not present

## 2021-01-06 DIAGNOSIS — R109 Unspecified abdominal pain: Secondary | ICD-10-CM | POA: Diagnosis not present

## 2021-01-06 DIAGNOSIS — R197 Diarrhea, unspecified: Secondary | ICD-10-CM | POA: Diagnosis not present

## 2021-02-09 ENCOUNTER — Other Ambulatory Visit: Payer: PPO

## 2021-02-09 ENCOUNTER — Other Ambulatory Visit: Payer: Self-pay | Admitting: Family Medicine

## 2021-02-15 DIAGNOSIS — J45909 Unspecified asthma, uncomplicated: Secondary | ICD-10-CM | POA: Diagnosis not present

## 2021-02-15 DIAGNOSIS — K2950 Unspecified chronic gastritis without bleeding: Secondary | ICD-10-CM | POA: Diagnosis not present

## 2021-02-15 DIAGNOSIS — R569 Unspecified convulsions: Secondary | ICD-10-CM | POA: Diagnosis not present

## 2021-02-15 DIAGNOSIS — R1011 Right upper quadrant pain: Secondary | ICD-10-CM | POA: Diagnosis not present

## 2021-02-15 DIAGNOSIS — R059 Cough, unspecified: Secondary | ICD-10-CM | POA: Diagnosis not present

## 2021-02-15 DIAGNOSIS — R140 Abdominal distension (gaseous): Secondary | ICD-10-CM | POA: Diagnosis not present

## 2021-02-15 DIAGNOSIS — G4730 Sleep apnea, unspecified: Secondary | ICD-10-CM | POA: Diagnosis not present

## 2021-02-15 DIAGNOSIS — Z800 Family history of malignant neoplasm of digestive organs: Secondary | ICD-10-CM | POA: Diagnosis not present

## 2021-02-16 ENCOUNTER — Telehealth: Payer: Self-pay | Admitting: Pharmacist

## 2021-02-16 DIAGNOSIS — R197 Diarrhea, unspecified: Secondary | ICD-10-CM | POA: Diagnosis not present

## 2021-02-16 DIAGNOSIS — Z8619 Personal history of other infectious and parasitic diseases: Secondary | ICD-10-CM | POA: Diagnosis not present

## 2021-02-16 DIAGNOSIS — Z800 Family history of malignant neoplasm of digestive organs: Secondary | ICD-10-CM | POA: Diagnosis not present

## 2021-02-16 DIAGNOSIS — R194 Change in bowel habit: Secondary | ICD-10-CM | POA: Diagnosis not present

## 2021-02-16 DIAGNOSIS — R634 Abnormal weight loss: Secondary | ICD-10-CM | POA: Diagnosis not present

## 2021-02-16 DIAGNOSIS — R140 Abdominal distension (gaseous): Secondary | ICD-10-CM | POA: Diagnosis not present

## 2021-02-16 NOTE — Progress Notes (Signed)
Chronic Care Management Pharmacy Assistant   Name: Valerie Vargas  MRN: [id number] DOB: [birth year]  Valerie Vargas is an 75 y.o. year old female who presents for his initial CCM visit with the clinical pharmacist.  Reason for Encounter: Initial Questions for CPP    Recent office visits:  09/03/20 Dr. Eliezer Lofts (PCP) Bloating Medications changes: Metronidazole 500 mg  Orders: CMP, C, difficile GDH and toxin A/B, CBC, gastrointestinal pathogen panel and lipase  Recent consult visits:  None ID  Hospital visits:  None in previous 6 months  Medications: Outpatient Encounter Medications as of 02/16/2021  Medication Sig   ALPRAZolam (XANAX) 0.5 MG tablet TAKE 1 TABLET BY MOUTH EVERY DAY AS NEEDED   Ascorbic Acid (VITAMIN C) 1000 MG tablet Take 1,000 mg by mouth daily.   benzonatate (TESSALON) 100 MG capsule TAKE TWO CAPSULES BY MOUTH 3 TIMES A DAYAS NEEDED FOR COUGH   blood glucose meter kit and supplies KIT Dispense based on patient and insurance preference. Use up to four times daily as directed. (FOR ICD-9 250.00, 250.01).   Cholecalciferol (VITAMIN D3) 1000 UNITS CAPS Take 1 capsule by mouth daily.   diphenhydrAMINE-PE-APAP 12.5-5-325 MG TABS Take 0.5 tablets by mouth as needed.   gabapentin (NEURONTIN) 100 MG tablet Take 300 mg by mouth 3 (three) times daily.   glucose blood (ONE TOUCH ULTRA TEST) test strip -TEST TWICE DAILY   Guaifenesin 1200 MG TB12 Take by mouth as needed.   L-Theanine 100 MG CAPS Take 2 tablets by mouth as needed.   levothyroxine (SYNTHROID, LEVOTHROID) 75 MCG tablet Take 75 mcg by mouth daily before breakfast.   loratadine (CLARITIN) 10 MG tablet Take 10 mg by mouth daily.   MAGNESIUM GLYCINATE PLUS PO Take 1 capsule by mouth daily.   metFORMIN (GLUCOPHAGE-XR) 500 MG 24 hr tablet    metoprolol succinate (TOPROL-XL) 50 MG 24 hr tablet TAKE 1 TABLET BY MOUTH EVERY DAY   metroNIDAZOLE (FLAGYL) 500 MG tablet Take 1 tablet (500 mg total) by mouth 2 (two)  times daily. Will pick up   nystatin (MYCOSTATIN) 100000 UNIT/ML suspension Take 5 mLs (500,000 Units total) by mouth 4 (four) times daily. Continue 3 days after thrush appears resolved   OMEGA-3 1000 MG CAPS Take 1,000 mg by mouth daily.   ONETOUCH DELICA LANCETS 64W MISC -TEST TWICE DAILY   promethazine-dextromethorphan (PROMETHAZINE-DM) 6.25-15 MG/5ML syrup Take 5 mLs by mouth 4 (four) times daily as needed for cough.   Pyridoxine HCl (VITAMIN B-6 PO) Take 1 tablet by mouth daily.   rosuvastatin (CRESTOR) 10 MG tablet TAKE 1 TABLET BY MOUTH EVERY DAY FOR CHOLESTEROL   scopolamine (TRANSDERM-SCOP, 1.5 MG,) 1 MG/3DAYS Place 1 patch (1.5 mg total) onto the skin every 3 (three) days.   Thiamine HCl (VITAMIN B-1) 250 MG tablet Take 250 mg by mouth daily.   VENTOLIN HFA 108 (90 Base) MCG/ACT inhaler USE 2 PUFFS BY MOUTH EVERY 6 HOURS AS NEEDED FOR WHEEZING FOR SHORTNESS OF BREATH   vitamin B-12 (CYANOCOBALAMIN) 1000 MCG tablet Take 1,000 mcg by mouth daily.   No facility-administered encounter medications on file as of 02/16/2021.    Pharmacist Review  Have you seen any other providers since your last visit? Patient states that she has seen a provider for gastro, eye doctor, and speech therapist  Any changes in your medications or health? No, she states that all of her medications are the same an changes, but because if a bacterial infection she does  take a medication for that but temporary  Any side effects from any medications? No, patient states that she is not having any side effects from medications  Do you have an symptoms or problems not managed by your medications? No, patient states that she is not having any symptoms or problems not managed by medications  Any concerns about your health right now? No, patient states that with this bacterial issues that they do not know what it's going to do, and she has been dealing with it for a long time   Has your provider asked that you check  blood pressure, blood sugar, or follow special diet at home? Yes, patient states that she only checks blood sugar if she feels some kind of way, but usually it is within range   Do you get any type of exercise on a regular basis? Yes, patient states she does some walking  Can you think of a goal you would like to reach for your health? Patient states that she would like to stay as healthy as she can  Do you have any problems getting your medications? No, patient states that she does not have any problems with getting medication or the cost of medications  Is there anything that you would like to discuss during the appointment? Patient states not at this time  Please bring medications and supplements to appointment   Star Rating Drugs: Rosuvastatin 10 mg 02/09/21 90 ds Metformin 02/09/21 30 ds  Ethelene Hal Clinical Pharmacist Assistant [phone]   Time spent:40

## 2021-02-17 ENCOUNTER — Other Ambulatory Visit: Payer: Self-pay

## 2021-02-18 ENCOUNTER — Encounter: Payer: Self-pay | Admitting: Nurse Practitioner

## 2021-02-18 ENCOUNTER — Encounter: Payer: PPO | Admitting: Family Medicine

## 2021-02-18 ENCOUNTER — Other Ambulatory Visit: Payer: Self-pay | Admitting: Nurse Practitioner

## 2021-02-18 ENCOUNTER — Ambulatory Visit
Admission: RE | Admit: 2021-02-18 | Discharge: 2021-02-18 | Disposition: A | Discharge status: Home / Self Care | Payer: PPO | Source: Ambulatory Visit | Attending: Nurse Practitioner | Admitting: Nurse Practitioner

## 2021-02-18 ENCOUNTER — Ambulatory Visit (INDEPENDENT_AMBULATORY_CARE_PROVIDER_SITE_OTHER): Payer: PPO | Admitting: Nurse Practitioner

## 2021-02-18 VITALS — BP 130/78 | HR 83 | Temp 97.2°F | Resp 16 | Ht 62.5 in | Wt 140.5 lb

## 2021-02-18 DIAGNOSIS — M898X5 Other specified disorders of bone, thigh: Secondary | ICD-10-CM

## 2021-02-18 DIAGNOSIS — M25551 Pain in right hip: Secondary | ICD-10-CM | POA: Insufficient documentation

## 2021-02-18 DIAGNOSIS — Y92009 Unspecified place in unspecified non-institutional (private) residence as the place of occurrence of the external cause: Secondary | ICD-10-CM

## 2021-02-18 DIAGNOSIS — W19XXXS Unspecified fall, sequela: Secondary | ICD-10-CM | POA: Diagnosis not present

## 2021-02-18 DIAGNOSIS — M79651 Pain in right thigh: Secondary | ICD-10-CM | POA: Diagnosis not present

## 2021-02-18 DIAGNOSIS — M1611 Unilateral primary osteoarthritis, right hip: Secondary | ICD-10-CM | POA: Diagnosis not present

## 2021-02-18 DIAGNOSIS — W19XXXA Unspecified fall, initial encounter: Secondary | ICD-10-CM | POA: Insufficient documentation

## 2021-02-18 MED ORDER — MELOXICAM 7.5 MG PO TABS: 7.5000 mg | ORAL_TABLET | Freq: Every day | ORAL | 0 refills | Status: DC

## 2021-02-18 NOTE — Assessment & Plan Note (Addendum)
States slipped in shower may 2022.  Did not get evaluated immediately.  Has been seeing her chiropractor weekly.  His treatments have been improving her discomfort.  Has been trying topical over-the-counter such as Biofreeze and Voltaren with minimal to mild relief.  States she also takes Tylenol that seems beneficial.  States she has not tried ibuprofen that she can remember or other NSAID. Order imaging pending results.  Start short low dose course of oral NSAID as needed. If x-rays negative and persistent discomfort we did discuss going to physical therapy for rehab.

## 2021-02-18 NOTE — Patient Instructions (Signed)
We will get images and then be in contact It is ok to take Tylenol OTC. I will send in a small script of an anti-inflammatory medication if you need it Let me know if you continue to not improve.

## 2021-02-18 NOTE — Progress Notes (Signed)
Acute Office Visit  Subjective:    Patient ID: BENIGNA DELISI, female    DOB: 07-01-1946, 75 y.o.   MRN: [id number]  Chief Complaint  Patient presents with   [place] in the shower in May 2022, slipped and fell on her right side, hit right hip pretty bad. Went to chiropractor ans was told she pulled a ligament. Hurts to get up and down. Hurts in the right upper leg and now hurting left leg also.     Patient is in today for an acute care visit. Had a fall in May 2022 while in the shower. Was evaluated by her chiropractor. Has been seeing him weekly. States she was told that she pulled ligament. Having difficulty getting out of the chair, getting into the car, bed, and bending over. Has tried biofreeze with some relief, along with voltaren gel, Tylenol seems most helpful Describes the discomfort as soreness. Has some stiffness of the morning and if she is sitting or resting for greater than 30 minutes. Concerned because she has a trip planned to [facility] on 03/06/2021.  Past Medical History:  Diagnosis Date   Allergy, unspecified not elsewhere classified    Chronic interstitial cystitis    Cough    Esophageal reflux    H/O syncope 2007   No recurrence;Tilt table 09/23/05-negative   Hyperlipidemia 08/22/2014   Hypertension    Hypertriglyceridemia    Irritable bowel syndrome    Nonocclusive coronary atherosclerosis of native coronary artery    Obstructive sleep apnea (adult) (pediatric)    Paroxysmal ventricular tachycardia (HCC)    Type II or unspecified type diabetes mellitus without mention of complication, not stated as uncontrolled    Unspecified asthma(493.90)    Unspecified hypothyroidism    Ventricular tachycardia (HCC)     Past Surgical History:  Procedure Laterality Date   ABDOMINAL HYSTERECTOMY  11/1979   for endometriosis   CHOLECYSTECTOMY  07/2000   ESOPHAGOGASTRODUODENOSCOPY ENDOSCOPY  2010    Family History  Problem Relation Age of Onset   Diabetes  Mother    Breast cancer Mother    Arthritis Mother    Diabetes Father    Stomach cancer Father    Heart disease Father    Leukemia Brother    Breast cancer Maternal Aunt    Breast cancer Paternal Aunt     Social History   Socioeconomic History   Marital status: Married    Spouse name: Not on file   Number of children: Not on file   Years of education: Not on file   Highest education level: Not on file  Occupational History   Occupation: retired  Tobacco Use   Smoking status: Never   Smokeless tobacco: Never  Vaping Use   Vaping Use: Never used  Substance and Sexual Activity   Alcohol use: No   Drug use: No   Sexual activity: Yes  Other Topics Concern   Not on file  Social History Narrative   Not on file   Social Determinants of Health   Financial Resource Strain: Not on file  Food Insecurity: Not on file  Transportation Needs: Not on file  Physical Activity: Not on file  Stress: Not on file  Social Connections: Not on file  Intimate Partner Violence: Not on file    Outpatient Medications Prior to Visit  Medication Sig Dispense Refill   ALPRAZolam (XANAX) 0.5 MG tablet TAKE 1 TABLET BY MOUTH EVERY DAY AS NEEDED 10 tablet 0  Ascorbic Acid (VITAMIN C) 1000 MG tablet Take 1,000 mg by mouth daily.     blood glucose meter kit and supplies KIT Dispense based on patient and insurance preference. Use up to four times daily as directed. (FOR ICD-9 250.00, 250.01). 1 each 0   Cholecalciferol (VITAMIN D3) 1000 UNITS CAPS Take 1 capsule by mouth daily.     diphenhydrAMINE-PE-APAP 12.5-5-325 MG TABS Take 0.5 tablets by mouth as needed.     gabapentin (NEURONTIN) 100 MG tablet Take 300 mg by mouth 3 (three) times daily.     glucose blood (ONE TOUCH ULTRA TEST) test strip -TEST TWICE DAILY 100 each 6   Guaifenesin 1200 MG TB12 Take by mouth as needed.     L-Theanine 100 MG CAPS Take 2 tablets by mouth as needed.     levothyroxine (SYNTHROID, LEVOTHROID) 75 MCG tablet Take 75  mcg by mouth daily before breakfast.     loratadine (CLARITIN) 10 MG tablet Take 10 mg by mouth daily.     MAGNESIUM GLYCINATE PLUS PO Take 1 capsule by mouth daily.     metFORMIN (GLUCOPHAGE-XR) 500 MG 24 hr tablet      metoprolol succinate (TOPROL-XL) 50 MG 24 hr tablet TAKE 1 TABLET BY MOUTH EVERY DAY 30 tablet 11   OMEGA-3 1000 MG CAPS Take 1,000 mg by mouth daily.     ONETOUCH DELICA LANCETS 88Q MISC -TEST TWICE DAILY 100 each 5   Pyridoxine HCl (VITAMIN B-6 PO) Take 1 tablet by mouth daily.     rosuvastatin (CRESTOR) 10 MG tablet TAKE 1 TABLET BY MOUTH EVERY DAY FOR CHOLESTEROL 90 tablet 0   Thiamine HCl (VITAMIN B-1) 250 MG tablet Take 250 mg by mouth daily.     VENTOLIN HFA 108 (90 Base) MCG/ACT inhaler USE 2 PUFFS BY MOUTH EVERY 6 HOURS AS NEEDED FOR WHEEZING FOR SHORTNESS OF BREATH 18 g 2   vitamin B-12 (CYANOCOBALAMIN) 1000 MCG tablet Take 1,000 mcg by mouth daily.     benzonatate (TESSALON) 100 MG capsule TAKE TWO CAPSULES BY MOUTH 3 TIMES A DAYAS NEEDED FOR COUGH (Patient not taking: Reported on 02/18/2021) 60 capsule 1   promethazine-dextromethorphan (PROMETHAZINE-DM) 6.25-15 MG/5ML syrup Take 5 mLs by mouth 4 (four) times daily as needed for cough. (Patient not taking: Reported on 02/18/2021) 180 mL 0   metroNIDAZOLE (FLAGYL) 500 MG tablet Take 1 tablet (500 mg total) by mouth 2 (two) times daily. Will pick up 20 tablet 0   nystatin (MYCOSTATIN) 100000 UNIT/ML suspension Take 5 mLs (500,000 Units total) by mouth 4 (four) times daily. Continue 3 days after thrush appears resolved 473 mL 0   scopolamine (TRANSDERM-SCOP, 1.5 MG,) 1 MG/3DAYS Place 1 patch (1.5 mg total) onto the skin every 3 (three) days. 4 patch 0   No facility-administered medications prior to visit.    Allergies  Allergen Reactions   Hctz [Hydrochlorothiazide] Other (See Comments)    Unknown reaction   Penicillins Rash   Sulfa Antibiotics Rash    Review of Systems  Constitutional:  Negative for chills and  fever.  Respiratory:  Negative for shortness of breath.   Cardiovascular:  Negative for chest pain.  Musculoskeletal:  Positive for arthralgias and myalgias.  Neurological:  Positive for weakness. Negative for numbness.       Termed soreness not weakness of leg      Objective:    Physical Exam Vitals and nursing note reviewed.  Constitutional:      Appearance: Normal appearance.  Cardiovascular:  Rate and Rhythm: Normal rate and regular rhythm.  Pulmonary:     Effort: Pulmonary effort is normal.     Breath sounds: Normal breath sounds.  Abdominal:     General: Bowel sounds are normal.  Musculoskeletal:        General: Tenderness and signs of injury present. No swelling or deformity.     Right lower leg: Tenderness and bony tenderness present. No deformity. No edema.     Left lower leg: No deformity. No edema.       Legs:     Comments: AROM intact but produces discomfort PROM intact with above abduction some discomfort Strength 5/5 bilateral lower extremities  Neurological:     General: No focal deficit present.     Mental Status: She is alert.  Psychiatric:        Mood and Affect: Mood normal.        Behavior: Behavior normal.        Thought Content: Thought content normal.        Judgment: Judgment normal.    BP 130/78   Pulse 83   Temp (!) 97.2 F (36.2 C)   Resp 16   Ht 5' 2.5" (1.588 m)   Wt 140 lb 8 oz (63.7 kg)   SpO2 97%   BMI 25.29 kg/m  Wt Readings from Last 3 Encounters:  02/18/21 140 lb 8 oz (63.7 kg)  09/03/20 146 lb 8 oz (66.5 kg)  03/27/20 144 lb (65.3 kg)    Health Maintenance Due  Topic Date Due   COVID-19 Vaccine (4 - Booster for Pfizer series) 08/08/2020   OPHTHALMOLOGY EXAM  09/02/2020   HEMOGLOBIN A1C  09/17/2020   INFLUENZA VACCINE  02/15/2021   URINE MICROALBUMIN  03/20/2021    There are no preventive care reminders to display for this patient.   Lab Results  Component Value Date   TSH 0.54 02/09/2018   Lab Results   Component Value Date   WBC 7.2 09/03/2020   HGB 12.7 09/03/2020   HCT 37.4 09/03/2020   MCV 87.8 09/03/2020   PLT 206.0 09/03/2020   Lab Results  Component Value Date   NA 138 09/03/2020   K 4.8 09/03/2020   CO2 28 09/03/2020   GLUCOSE 103 (H) 09/03/2020   BUN 9 09/03/2020   CREATININE 0.82 09/03/2020   BILITOT 0.5 09/03/2020   ALKPHOS 43 09/03/2020   AST 32 09/03/2020   ALT 24 09/03/2020   PROT 7.4 09/03/2020   ALBUMIN 4.6 09/03/2020   CALCIUM 9.7 09/03/2020   GFR 69.58 09/03/2020   Lab Results  Component Value Date   CHOL 131 03/19/2020   Lab Results  Component Value Date   HDL 49 03/19/2020   Lab Results  Component Value Date   LDLCALC 55 03/19/2020   Lab Results  Component Value Date   TRIG 164 (A) 03/19/2020   Lab Results  Component Value Date   CHOLHDL 3 02/09/2018   Lab Results  Component Value Date   HGBA1C 6.6 03/19/2020       Assessment & Plan:   Problem List Items Addressed This Visit       Other   Fall at home   Right hip pain - Primary    States slipped in shower may 2022.  Did not get evaluated immediately.  Has been seeing her chiropractor weekly.  His treatments have been improving her discomfort.  Has been trying topical over-the-counter such as Biofreeze and Voltaren with minimal to  mild relief.  States she also takes Tylenol that seems beneficial.  States she has not tried ibuprofen that she can remember or other NSAID. Order imaging pending results.  Start short low dose course of oral NSAID as needed. If x-rays are negative we did discuss using PT as next form of rehab.       Relevant Medications   meloxicam (MOBIC) 7.5 MG tablet   Other Relevant Orders   DG HIP UNILAT WITH PELVIS 2-3 VIEWS RIGHT   Pain in right femur    States slipped in shower may 2022.  Did not get evaluated immediately.  Has been seeing her chiropractor weekly.  His treatments have been improving her discomfort.  Has been trying topical over-the-counter  such as Biofreeze and Voltaren with minimal to mild relief.  States she also takes Tylenol that seems beneficial.  States she has not tried ibuprofen that she can remember or other NSAID. Order imaging pending results.  Start short low dose course of oral NSAID as needed. If x-rays negative and persistent discomfort we did discuss going to physical therapy for rehab.       Relevant Orders   DG FEMUR MIN 2 VIEWS LEFT     No orders of the defined types were placed in this encounter.    Romilda Garret, NP

## 2021-02-18 NOTE — Assessment & Plan Note (Addendum)
States slipped in shower may 2022.  Did not get evaluated immediately.  Has been seeing her chiropractor weekly.  His treatments have been improving her discomfort.  Has been trying topical over-the-counter such as Biofreeze and Voltaren with minimal to mild relief.  States she also takes Tylenol that seems beneficial.  States she has not tried ibuprofen that she can remember or other NSAID. Order imaging pending results.  Start short low dose course of oral NSAID as needed. If x-rays are negative we did discuss using PT as next form of rehab.

## 2021-02-22 DIAGNOSIS — R140 Abdominal distension (gaseous): Secondary | ICD-10-CM | POA: Diagnosis not present

## 2021-02-22 DIAGNOSIS — Z800 Family history of malignant neoplasm of digestive organs: Secondary | ICD-10-CM | POA: Diagnosis not present

## 2021-02-22 DIAGNOSIS — A048 Other specified bacterial intestinal infections: Secondary | ICD-10-CM | POA: Diagnosis not present

## 2021-02-22 DIAGNOSIS — R197 Diarrhea, unspecified: Secondary | ICD-10-CM | POA: Diagnosis not present

## 2021-02-23 ENCOUNTER — Telehealth: Payer: PPO

## 2021-02-24 ENCOUNTER — Ambulatory Visit (INDEPENDENT_AMBULATORY_CARE_PROVIDER_SITE_OTHER): Payer: PPO | Admitting: Pharmacist

## 2021-02-24 ENCOUNTER — Other Ambulatory Visit: Payer: Self-pay

## 2021-02-24 DIAGNOSIS — I2510 Atherosclerotic heart disease of native coronary artery without angina pectoris: Secondary | ICD-10-CM

## 2021-02-24 DIAGNOSIS — I10 Essential (primary) hypertension: Secondary | ICD-10-CM

## 2021-02-24 DIAGNOSIS — E7800 Pure hypercholesterolemia, unspecified: Secondary | ICD-10-CM | POA: Diagnosis not present

## 2021-02-24 DIAGNOSIS — E119 Type 2 diabetes mellitus without complications: Secondary | ICD-10-CM

## 2021-02-24 DIAGNOSIS — E038 Other specified hypothyroidism: Secondary | ICD-10-CM

## 2021-02-24 DIAGNOSIS — N3010 Interstitial cystitis (chronic) without hematuria: Secondary | ICD-10-CM

## 2021-02-24 DIAGNOSIS — K219 Gastro-esophageal reflux disease without esophagitis: Secondary | ICD-10-CM

## 2021-02-24 NOTE — Progress Notes (Signed)
Chronic Care Management Pharmacy Note  02/26/2021 Name:  Valerie Vargas MRN:  [id number] DOB:  12/19/1945  Summary: -Pt reports urinary incontinence w/ accidents, frequent urination, trouble emptying bladder completely; she does have history of interstitial cystitis and did not tolerate Elmiron, but she has not tried Overactive Bladder medications before  Recommendations/Changes made from today's visit: -Recommend trial of tolterodine ER 4 mg (preferred formulary agent)   Subjective: Valerie Vargas is an 75 y.o. year old female who is a primary patient of Bedsole, Amy E, MD.  The CCM team was consulted for assistance with disease management and care coordination needs.    Engaged with patient by telephone for initial visit in response to provider referral for pharmacy case management and/or care coordination services.   Consent to Services:  The patient was given the following information about Chronic Care Management services today, agreed to services, and gave verbal consent: 1. CCM service includes personalized support from designated clinical staff supervised by the primary care provider, including individualized plan of care and coordination with other care providers 2. 24/7 contact phone numbers for assistance for urgent and routine care needs. 3. Service will only be billed when office clinical staff spend 20 minutes or more in a month to coordinate care. 4. Only one practitioner may furnish and bill the service in a calendar month. 5.The patient may stop CCM services at any time (effective at the end of the month) by phone call to the office staff. 6. The patient will be responsible for cost sharing (co-pay) of up to 20% of the service fee (after annual deductible is met). Patient agreed to services and consent obtained.  Patient Care Team: Jinny Sanders, MD as PCP - General (Family Medicine) Croitoru, Dani Gobble, MD as PCP - Cardiology (Cardiology) Jacelyn Pi, MD as Attending  Physician (Endocrinology) Ernestine Conrad, MD as Referring Physician (Otolaryngology) Jola Schmidt, MD as Consulting Physician (Ophthalmology) Levin Erp, DDS, PA as Referring Physician (Dentistry) Debbora Dus, [facility] as Pharmacist (Pharmacist)  Patient retired from [employer] in 2014. She lives with husband Valerie Vargas, this is her 2nd marriage, they are both widowed. She reports her family has a history of high triglycerides and stomach issues.  Recent office visits: 09/03/20 Dr. Eliezer Lofts (PCP) Bloating Medications changes: Metronidazole 500 mg Orders: CMP, C, difficile GDH and toxin A/B, CBC, gastrointestinal pathogen panel and lipase  Recent consult visits: 02/18/21 NP Karl Ito (pain mgmt): s/p fall May 2022, hip pain. Rx'd meloxicam. D/c metronidazole, nystatin, scopolamine (completed course). Pt is seeing chiropractor, using Tylenol, Voltaren gel, Biofreeze.  02/15/21 Dr Oren Beckmann (GI) - endoscopy  01/06/21 Dr Oren Beckmann (GI): f/u bloating, gastroparesis  12/31/20 Dr Iona Hansen (Retina): f/u vitreous degeneration  Hospital visits: None in previous 6 months   Objective:  Lab Results  Component Value Date   CREATININE 0.82 09/03/2020   BUN 9 09/03/2020   GFR 69.58 09/03/2020   GFRNONAA 61 03/19/2020   GFRAA 70 03/19/2020   NA 138 09/03/2020   K 4.8 09/03/2020   CALCIUM 9.7 09/03/2020   CO2 28 09/03/2020   GLUCOSE 103 (H) 09/03/2020    Lab Results  Component Value Date/Time   HGBA1C 6.6 03/19/2020 12:00 AM   HGBA1C 6.3 02/20/2019 12:00 AM   GFR 69.58 09/03/2020 01:32 PM   GFR 65.11 02/09/2018 10:33 AM   MICROALBUR <7 02/20/2019 12:00 AM   MICROALBUR <0.7 02/09/2018 10:33 AM    Last diabetic Eye exam:  Lab Results  Component Value Date/Time  HMDIABEYEEXA No Retinopathy 09/03/2019 12:00 AM    Last diabetic Foot exam:  Lab Results  Component Value Date/Time   HMDIABFOOTEX done 03/27/2020 12:00 AM     Lab Results  Component Value Date   CHOL 131 03/19/2020    HDL 49 03/19/2020   LDLCALC 55 03/19/2020   LDLDIRECT 70.0 02/09/2018   TRIG 164 (A) 03/19/2020   CHOLHDL 3 02/09/2018    Hepatic Function Latest Ref Rng & Units 09/03/2020 03/19/2020 02/09/2018  Total Protein 6.0 - 8.3 g/dL 7.4 - 7.8  Albumin 3.5 - 5.2 g/dL 4.6 - 4.8  AST 0 - 37 U/L 32 33 21  ALT 0 - 35 U/L '24 27 16  ' Alk Phosphatase 39 - 117 U/L 43 56 45  Total Bilirubin 0.2 - 1.2 mg/dL 0.5 - 0.5    Lab Results  Component Value Date/Time   TSH 0.54 02/09/2018 10:33 AM   TSH 1.50 02/08/2017 11:18 AM   FREET4 0.89 02/09/2018 10:33 AM   FREET4 0.78 02/08/2017 11:18 AM    CBC Latest Ref Rng & Units 09/03/2020 02/09/2018 02/08/2017  WBC 4.0 - 10.5 K/uL 7.2 7.4 6.4  Hemoglobin 12.0 - 15.0 g/dL 12.7 13.1 13.0  Hematocrit 36.0 - 46.0 % 37.4 38.7 39.6  Platelets 150.0 - 400.0 K/uL 206.0 215.0 226.0    No results found for: VD25OH  Clinical ASCVD: Yes  The 10-year ASCVD risk score Mikey Bussing DC Jr., et al., 2013) is: 40.2%*   Values used to calculate the score:     Age: 75 years     Sex: Female     Is Non-Hispanic African American: No     Diabetic: Yes     Tobacco smoker: No     Systolic Blood Pressure: 256 mmHg     Is BP treated: Yes     HDL Cholesterol: 49 mg/dL*     Total Cholesterol: 131 mg/dL*     * - Cholesterol units were assumed for this score calculation    Depression screen [facility] 2/9 03/20/2020 02/19/2019 02/09/2018  Decreased Interest 0 0 0  Down, Depressed, Hopeless 0 0 0  PHQ - 2 Score 0 0 0  Altered sleeping 0 3 0  Tired, decreased energy 0 3 0  Change in appetite 0 0 0  Feeling bad or failure about yourself  0 0 0  Trouble concentrating 0 0 0  Moving slowly or fidgety/restless 0 0 0  Suicidal thoughts 0 0 0  PHQ-9 Score 0 6 0  Difficult doing work/chores Not difficult at all Not difficult at all Not difficult at all      Social History   Tobacco Use  Smoking Status Never  Smokeless Tobacco Never   BP Readings from Last 3 Encounters:  02/18/21 130/78  09/03/20  (!) 142/90  03/27/20 140/70   Pulse Readings from Last 3 Encounters:  02/18/21 83  09/03/20 84  03/27/20 70   Wt Readings from Last 3 Encounters:  02/18/21 140 lb 8 oz (63.7 kg)  09/03/20 146 lb 8 oz (66.5 kg)  03/27/20 144 lb (65.3 kg)   BMI Readings from Last 3 Encounters:  02/18/21 25.29 kg/m  09/03/20 26.37 kg/m  03/27/20 25.92 kg/m    Assessment/Interventions: Review of patient past medical history, allergies, medications, health status, including review of consultants reports, laboratory and other test data, was performed as part of comprehensive evaluation and provision of chronic care management services.   SDOH:  (Social Determinants of Health) assessments and interventions performed: Yes  SDOH Interventions    Flowsheet Row Most Recent Value  SDOH Interventions   Financial Strain Interventions Intervention Not Indicated      SDOH Screenings   Alcohol Screen: Not on file  Depression (PHQ2-9): Low Risk    PHQ-2 Score: 0  Financial Resource Strain: Low Risk    Difficulty of Paying Living Expenses: Not hard at all  Food Insecurity: Not on file  Housing: Not on file  Physical Activity: Not on file  Social Connections: Not on file  Stress: Not on file  Tobacco Use: Low Risk    Smoking Tobacco Use: Never   Smokeless Tobacco Use: Never  Transportation Needs: Not on file    [place]  Allergies  Allergen Reactions   Elmiron [Pentosan Polysulfate Sodium] Anxiety    "I felt like I wanted to kill myself"   Hctz [Hydrochlorothiazide] Other (See Comments)    Unknown reaction   Penicillins Rash   Sulfa Antibiotics Rash    Medications Reviewed Today     Reviewed by Charlton Haws, [facility] (Pharmacist) on 02/24/21 at [place] List Status: <None>   Medication Order Taking? Sig Documenting Provider Last Dose Status Informant  ALPRAZolam (XANAX) 0.5 MG tablet [id number] Yes TAKE 1 TABLET BY MOUTH EVERY DAY AS NEEDED Bedsole, Amy E, MD Taking Active    Ascorbic Acid (VITAMIN C) 1000 MG tablet [id number] Yes Take 1,000 mg by mouth daily. [provider] Taking Active   benzonatate (TESSALON) 100 MG capsule [id number] Yes TAKE TWO CAPSULES BY MOUTH 3 TIMES A DAYAS NEEDED FOR COUGH Bedsole, Amy E, MD Taking Active   blood glucose meter kit and supplies KIT [id number] Yes Dispense based on patient and insurance preference. Use up to four times daily as directed. (FOR ICD-9 250.00, 250.01). Jinny Sanders, MD Taking Active   Cholecalciferol (VITAMIN D3) 1000 UNITS CAPS [id number] Yes Take 1 capsule by mouth daily. [provider] Taking Active   Cranberry 200 MG CAPS [id number] Yes Take by mouth. [provider] Taking Active   diphenhydrAMINE-PE-APAP 12.5-5-325 MG TABS [id number] Yes Take 0.5 tablets by mouth as needed. [provider] Taking Active   gabapentin (NEURONTIN) 100 MG tablet [id number] Yes Take 300 mg by mouth 3 (three) times daily. [provider] Taking Active   glucose blood (ONE TOUCH ULTRA TEST) test strip [id number] Yes -TEST TWICE DAILY Bedsole, Amy E, MD Taking Active   Guaifenesin 1200 MG TB12 [id number] Yes Take by mouth as needed. [provider] Taking Active   L-Theanine 100 MG CAPS [id number] Yes Take 2 tablets by mouth as needed. [provider] Taking Active   levothyroxine (SYNTHROID, LEVOTHROID) 75 MCG tablet [id number] Yes Take 75 mcg by mouth daily before breakfast. Brand Synthroid [provider] Taking Active Self  loratadine (CLARITIN) 10 MG tablet [id number] Yes Take 10 mg by mouth daily. [provider] Taking Active   MAGNESIUM GLYCINATE PLUS PO [id number] No Take 1 capsule by mouth daily.  Patient not taking: Reported on 02/24/2021   [provider] Not Taking Active   meloxicam (MOBIC) 7.5 MG tablet [id number] Yes Take 1 tablet (7.5 mg total) by mouth daily. Take with food to avoid upset stomach Cable, Alyson Locket, NP Taking Active   metFORMIN  (GLUCOPHAGE-XR) 500 MG 24 hr tablet [id number] Yes  [provider] Taking Active   metoprolol succinate (TOPROL-XL) 50 MG 24 hr tablet [id number] Yes TAKE 1 TABLET BY MOUTH EVERY DAY Croitoru, Mihai, MD Taking Active  OMEGA-3 1000 MG CAPS [id number] Yes Take 1,000 mg by mouth daily. [provider] Taking Active   [facility] LANCETS 17O MISC [id number] Yes -TEST TWICE DAILY Jinny Sanders, MD Taking Active   OVER THE COUNTER MEDICATION [id number] Yes BLADDER EASE [provider] Taking Active   Pyridoxine HCl (VITAMIN B-6 PO) [id number] Yes Take 1 tablet by mouth daily. [provider] Taking Active   rosuvastatin (CRESTOR) 10 MG tablet [id number] Yes TAKE 1 TABLET BY MOUTH EVERY DAY FOR CHOLESTEROL Bedsole, Amy E, MD Taking Active   Thiamine HCl (VITAMIN B-1) 250 MG tablet [id number] Yes Take 250 mg by mouth daily. [provider] Taking Active   vitamin B-12 (CYANOCOBALAMIN) 1000 MCG tablet [id number] Yes Take 1,000 mcg by mouth daily. [provider] Taking Active             Patient Active Problem List   Diagnosis Date Noted   Fall at home 02/18/2021   Right hip pain 02/18/2021   Pain in right femur 02/18/2021   Mid back pain on left side 09/03/2020   RLQ abdominal pain 09/03/2020   Diarrhea 09/03/2020   Bloating 09/03/2020   Osteoarthritis, hand 01/22/2016   Left medial knee pain 01/22/2016   Low back pain 01/22/2016   Counseling regarding end of life decision making 11/06/2014   De Quervain's tenosynovitis, left 03/13/2014   Anemia, iron deficiency 08/15/2013   Hearing loss 08/15/2013   HTN (hypertension) 12/10/2012   CAD (coronary artery disease) 12/10/2012   Low back pain radiating to both legs 04/06/2012   Tinnitus of both ears 01/31/2012   Syncope 07/14/2011   Carpal tunnel syndrome 03/17/2011   High cholesterol 11/17/2010   Spasm larynx and swelling episodes 11/08/2010   Vocal cord dysfunction 11/08/2010   OTHER  DISEASES OF VOCAL CORDS 11/08/2007   COUGH, chronic  07/02/2007   Hypothyroidism 06/05/2007   Diabetes mellitus with no complication ([place]) 85/27/7824   OBSTRUCTIVE SLEEP APNEA 06/05/2007   HX of VENTRICULAR TACHYCARDIA 06/05/2007   ASTHMA 06/05/2007   GASTROESOPHAGEAL REFLUX DISEASE 06/05/2007   IRRITABLE BOWEL SYNDROME 06/05/2007   INTERSTITIAL CYSTITIS 06/05/2007   ALLERGY 06/05/2007    Immunization History  Administered Date(s) Administered   Fluad Quad(high Dose 65+) 05/07/2019, 03/27/2020   Influenza Split 05/15/2013   Influenza, High Dose Seasonal PF 04/19/2016, 05/09/2017, 04/25/2018   Influenza-Unspecified 05/01/2014, 06/03/2015, 04/19/2016   PFIZER(Purple Top)SARS-COV-2 Vaccination 07/30/2019, 08/20/2019, 05/08/2020   Pneumococcal Conjugate-13 08/15/2012   Pneumococcal Polysaccharide-23 11/06/2014   Tdap 06/03/2015   Zoster Recombinat (Shingrix) 03/31/2020, 07/01/2020   Zoster, Live 03/06/2013    Conditions to be addressed/monitored:  Hypertension, Hyperlipidemia, Diabetes, Coronary Artery Disease, GERD, and Hypothyroidism  Care Plan : CCM Pharmacy Care Plan  Updates made by Charlton Haws, [place] since 02/26/2021 12:00 AM     Problem: Hypertension, Hyperlipidemia, Diabetes, Coronary Artery Disease, GERD, and Hypothyroidism   Priority: High     Long-Range Goal: Disease management   Start Date: 02/26/2021  Expected End Date: 02/26/2022  This Visit's Progress: On track  Priority: High  Note:   Current Barriers:  Unable to independently monitor therapeutic efficacy Unable to achieve control of frequent urination/incontinence   Pharmacist Clinical Goal(s):  Patient will achieve adherence to monitoring guidelines and medication adherence to achieve therapeutic efficacy achieve control of frequent urination as evidenced by patient report through collaboration with PharmD and provider.   Interventions: 1:1 collaboration with Jinny Sanders, MD regarding  development and update of comprehensive plan of care  as evidenced by provider attestation and co-signature Inter-disciplinary care team collaboration (see longitudinal plan of care) Comprehensive medication review performed; medication list updated in electronic medical record  Hypertension / Hx tachycardia (BP goal <130/80) -Controlled - BP is at goal in office and at home; pt reports occasionally BP is in 140s/80s; she reports hx of low BP when she was taking losartan too -hx of "passing out" at 4am; -Current treatment: Metoprolol succinate 50 mg daily HS -Medications previously tried: losartan  -Current home readings: 125/65 - 140/80 -Denies hypotensive/hypertensive symptoms -Educated on BP goals and benefits of medications for prevention of heart attack, stroke and kidney damage; Importance of home blood pressure monitoring; -Counseled to monitor BP at home periodically, document, and provide log at future appointments -Recommended to continue current medication  Hyperlipidemia / CAD (LDL goal < 70) -Controlled - LDL is at goal; pt endorses compliance with statin and denies issues -mild CAD on cardiac cath 2003, normal perfusion study 2007 -Current treatment: Rosuvastatin 10 mg daily PM Omega-3 fish oil 1000 mg daily -Educated on Cholesterol goals; Benefits of statin for ASCVD risk reduction; -Recommended to continue current medication  Diabetes (A1c goal <7%) -Controlled - A1c is at goal; pt endorses compliance with metformin -Follows with Dr Chalmers Cater -Current medications: Metformin XR 500 mg BID Testing supplies -Educated on A1c and blood sugar goals; Complications of diabetes including kidney damage, retinal damage, and cardiovascular disease; -Recommended to continue current medication  Gastroparesis / bloating (Goal: manage symptoms) -Not ideally controlled - pt has not started dicyclomine yet, she was concerned about how it works/side effects -pt is working on increasing  fluid intake -Current treatment  Dicyclomine 10 mg TID prn (Dr Oren Beckmann) - not started Benefiber Dulcolax PRN -Counseled on dicyclomine benefits and possible side effects -Recommended to continue current medication  Hypothyroidism (Goal: maintain TSH in goal range) -Controlled - pt endorses compliance with Synthroid, takes on empty stomach first thing in AM -pt gets Synthroid brand discounted with mail pharmacy ($25 per month) -Current treatment  Synthroid 75 mcg daily -Recommended to continue current medication  Pain (Goal: manage pain) -Controlled - -Pt has been seeing chiropractor as well -Hx Carpal tunnel, osteoarthritis -Current treatment  Meloxicam 7.5 mg daily Tylenol 500 mg PRN -Advised to limit Tylenol to < 2000 mg/day given hx of elevated LFTs due to tylenol  Chronic cough (Goal: manage symptoms) -Controlled - pt reports symptoms are much better now than they used to be -Current treatment  Gabapentin 100 mg - 3 tab TID PRN (Dr Carol Ada) Benzonatate 100 mg TID PRN Alprazolam 0.5 mg PRN (#10 per year) -Recommended to continue current medication  Allergic rhinitis / sinus (Goal: manage symptoms) -Controlled -Current treatment  Diphenhydramine-PE-APAP Mucinex Loratadine 10 mg Promethazine-DM Ventolin HFA -Recommended to continue current medication  Overactive bladder? (Goal: manage symptoms) -Uncontrolled -Pt endorses frequent urination, incomplete emptying, incontinence (with accidents); she has only been on Elmiron for interstitial cystitis but could not tolerate behavioral side effects, she does not remember trying OAB medications before -Medications previously tried: Elmiron  -Discussed anticholinergic or beta-3 agonist medication may help with her symptoms -Preferred options per [place] formulary:   Tier 1 - oxybutynin HCL 5 mg  Tier 2 - darifenacin, oxybutynin XL, tolterodine, trospium  Tier 3 - Myrbetriq  Tier 4 - solifenacin (nonpreferred) -Recommend  trial of tolterodine ER 4 mg daily  Health Maintenance -Vaccine gaps: Covid booster -Current therapy:  Vitamin B12 1000 mcg Vitamin C 1000 mg Vitamin D 1000 IU L-theanine 100  mg - not taking Magnesium glycinate - not taking Vitamin B6 Thiamine 250 mg -Patient is satisfied with current therapy and denies issues -Recommended to continue current medication  Patient Goals/Self-Care Activities Patient will:  - take medications as prescribed focus on medication adherence by pill box check blood pressure 2-3x weekly, document, and provide at future appointments      Medication Assistance: None required.  Patient affirms current coverage meets needs.  Compliance/Adherence/Medication fill history: Care Gaps: Covid booster (due 08/08/20) Eye exam (due 09/02/20) A1c (due 09/17/20)  Star-Rating Drugs: Rosuvastatin - LF 02/09/21 x 90 ds Metformin - LF 02/09/21 x 90 ds  Patient's preferred pharmacy is:  [place], [place] - [address] [place] [address] Phone: [phone] Fax: [phone]  [place], [place] - [address] [place] [address] Phone: [phone] Fax: [phone]  Uses pill box? Yes Pt endorses 100% compliance  We discussed: Current pharmacy is preferred with insurance plan and patient is satisfied with pharmacy services Patient decided to: Continue current medication management strategy  Care Plan and Follow Up Patient Decision:  Patient agrees to Care Plan and Follow-up.  Plan: Telephone follow up appointment with care management team member scheduled for:  3 months  Charlene Brooke, PharmD, [place], CPP Clinical Pharmacist [facility] [phone]

## 2021-02-26 ENCOUNTER — Other Ambulatory Visit: Payer: Self-pay | Admitting: Family Medicine

## 2021-02-26 MED ORDER — TOLTERODINE TARTRATE ER 4 MG PO CP24: 4.0000 mg | ORAL_CAPSULE | Freq: Every day | ORAL | 11 refills | Status: DC

## 2021-02-26 NOTE — Patient Instructions (Addendum)
Visit Information  Phone number for Pharmacist: [phone]  Thank you for meeting with me to discuss your medications! I look forward to working with you to achieve your health care goals. Below is a summary of what we talked about during the visit:   Goals Addressed             This Visit's Progress    Manage My Medicine       Timeframe:  Long-Range Goal Priority:  Medium Start Date:           02/26/21                  Expected End Date:  02/26/22                     Follow Up Date Nov 2022   - call for medicine refill 2 or 3 days before it runs out - call if I am sick and can't take my medicine - keep a list of all the medicines I take; vitamins and herbals too - use a pillbox to sort medicine    Why is this important?   These steps will help you keep on track with your medicines.   Notes:         Care Plan : [place]  Updates made by Charlton Haws, RPH since 02/26/2021 12:00 AM     Problem: Hypertension, Hyperlipidemia, Diabetes, Coronary Artery Disease, GERD, and Hypothyroidism   Priority: High     Long-Range Goal: Disease management   Start Date: 02/26/2021  Expected End Date: 02/26/2022  This Visit's Progress: On track  Priority: High  Note:   Current Barriers:  Unable to independently monitor therapeutic efficacy Unable to achieve control of frequent urination/incontinence   Pharmacist Clinical Goal(s):  Patient will achieve adherence to monitoring guidelines and medication adherence to achieve therapeutic efficacy achieve control of frequent urination as evidenced by patient report through collaboration with PharmD and provider.   Interventions: 1:1 collaboration with Jinny Sanders, MD regarding development and update of comprehensive plan of care as evidenced by provider attestation and co-signature Inter-disciplinary care team collaboration (see longitudinal plan of care) Comprehensive medication review performed; medication  list updated in electronic medical record  Hypertension / Hx tachycardia (BP goal <130/80) -Controlled - BP is at goal in office and at home; pt reports occasionally BP is in 140s/80s; she reports hx of low BP when she was taking losartan too -hx of "passing out" at 4am; -Current treatment: Metoprolol succinate 50 mg daily HS -Medications previously tried: losartan  -Current home readings: 125/65 - 140/80 -Denies hypotensive/hypertensive symptoms -Educated on BP goals and benefits of medications for prevention of heart attack, stroke and kidney damage; Importance of home blood pressure monitoring; -Counseled to monitor BP at home periodically, document, and provide log at future appointments -Recommended to continue current medication  Hyperlipidemia / CAD (LDL goal < 70) -Controlled - LDL is at goal; pt endorses compliance with statin and denies issues -mild CAD on cardiac cath 2003, normal perfusion study 2007 -Current treatment: Rosuvastatin 10 mg daily PM Omega-3 fish oil 1000 mg daily -Educated on Cholesterol goals; Benefits of statin for ASCVD risk reduction; -Recommended to continue current medication  Diabetes (A1c goal <7%) -Controlled - A1c is at goal; pt endorses compliance with metformin -Follows with Dr Chalmers Cater -Current medications: Metformin XR 500 mg BID Testing supplies -Educated on A1c and blood sugar goals; Complications of diabetes including kidney damage, retinal damage,  and cardiovascular disease; -Recommended to continue current medication  Gastroparesis / bloating (Goal: manage symptoms) -Not ideally controlled - pt has not started dicyclomine yet, she was concerned about how it works/side effects -pt is working on increasing fluid intake -Current treatment  Dicyclomine 10 mg TID prn (Dr Oren Beckmann) - not started Benefiber Dulcolax PRN -Counseled on dicyclomine benefits and possible side effects -Recommended to continue current medication  Hypothyroidism  (Goal: maintain TSH in goal range) -Controlled - pt endorses compliance with Synthroid, takes on empty stomach first thing in AM -pt gets Synthroid brand discounted with mail pharmacy ($25 per month) -Current treatment  Synthroid 75 mcg daily -Recommended to continue current medication  Pain (Goal: manage pain) -Controlled - -Pt has been seeing chiropractor as well -Hx Carpal tunnel, osteoarthritis -Current treatment  Meloxicam 7.5 mg daily Tylenol 500 mg PRN -Advised to limit Tylenol to < 2000 mg/day given hx of elevated LFTs due to tylenol  Chronic cough (Goal: manage symptoms) -Controlled - pt reports symptoms are much better now than they used to be -Current treatment  Gabapentin 100 mg - 3 tab TID PRN (Dr Carol Ada) Benzonatate 100 mg TID PRN Alprazolam 0.5 mg PRN (#10 per year) -Recommended to continue current medication  Allergic rhinitis / sinus (Goal: manage symptoms) -Controlled -Current treatment  Diphenhydramine-PE-APAP Mucinex Loratadine 10 mg Promethazine-DM Ventolin HFA -Recommended to continue current medication  Overactive bladder? (Goal: manage symptoms) -Uncontrolled -Pt endorses frequent urination, incomplete emptying, incontinence (with accidents); she has only been on Elmiron for interstitial cystitis but could not tolerate behavioral side effects, she does not remember trying OAB medications before -Medications previously tried: Elmiron  -Discussed anticholinergic or beta-3 agonist medication may help with her symptoms -Preferred options per [place] formulary:   Tier 1 - oxybutynin HCL 5 mg  Tier 2 - darifenacin, oxybutynin XL, tolterodine, trospium  Tier 3 - Myrbetriq  Tier 4 - solifenacin (nonpreferred) -Recommend trial of tolterodine ER 4 mg daily  Health Maintenance -Vaccine gaps: Covid booster -Current therapy:  Vitamin B12 1000 mcg Vitamin C 1000 mg Vitamin D 1000 IU L-theanine 100 mg - not taking Magnesium glycinate - not  taking Vitamin B6 Thiamine 250 mg -Patient is satisfied with current therapy and denies issues -Recommended to continue current medication  Patient Goals/Self-Care Activities Patient will:  - take medications as prescribed focus on medication adherence by pill box check blood pressure 2-3x weekly, document, and provide at future appointments      Ms. Hiltunen was given information about Chronic Care Management services today including:  CCM service includes personalized support from designated clinical staff supervised by her physician, including individualized plan of care and coordination with other care providers 24/7 contact phone numbers for assistance for urgent and routine care needs. Standard insurance, coinsurance, copays and deductibles apply for chronic care management only during months in which we provide at least 20 minutes of these services. Most insurances cover these services at 100%, however patients may be responsible for any copay, coinsurance and/or deductible if applicable. This service may help you avoid the need for more expensive face-to-face services. Only one practitioner may furnish and bill the service in a calendar month. The patient may stop CCM services at any time (effective at the end of the month) by phone call to the office staff.  Patient agreed to services and verbal consent obtained.   Patient verbalizes understanding of instructions provided today and agrees to view in [place].  Telephone follow up appointment with pharmacy team member scheduled  for: 3 months  Charlene Brooke, PharmD, [place], CPP Clinical Pharmacist [facility] [phone]

## 2021-03-19 ENCOUNTER — Telehealth: Payer: Self-pay | Admitting: Family Medicine

## 2021-03-19 DIAGNOSIS — D508 Other iron deficiency anemias: Secondary | ICD-10-CM

## 2021-03-19 DIAGNOSIS — E119 Type 2 diabetes mellitus without complications: Secondary | ICD-10-CM

## 2021-03-19 DIAGNOSIS — E038 Other specified hypothyroidism: Secondary | ICD-10-CM

## 2021-03-19 NOTE — Telephone Encounter (Signed)
-----   Message from Ellamae Sia sent at 03/08/2021 11:01 AM EDT ----- Regarding: Lab orders for Wednesday, 9.8.22 Patient is scheduled for CPX labs, please order future labs, Thanks , Karna Christmas

## 2021-03-22 ENCOUNTER — Ambulatory Visit: Payer: PPO

## 2021-03-22 LAB — HM DIABETES FOOT EXAM

## 2021-03-24 ENCOUNTER — Other Ambulatory Visit: Payer: PPO

## 2021-03-24 ENCOUNTER — Other Ambulatory Visit (INDEPENDENT_AMBULATORY_CARE_PROVIDER_SITE_OTHER): Payer: PPO

## 2021-03-24 ENCOUNTER — Other Ambulatory Visit: Payer: Self-pay

## 2021-03-24 DIAGNOSIS — D508 Other iron deficiency anemias: Secondary | ICD-10-CM | POA: Diagnosis not present

## 2021-03-24 DIAGNOSIS — E119 Type 2 diabetes mellitus without complications: Secondary | ICD-10-CM

## 2021-03-24 DIAGNOSIS — E038 Other specified hypothyroidism: Secondary | ICD-10-CM

## 2021-03-24 LAB — CBC WITH DIFFERENTIAL/PLATELET
Basophils Absolute: 0 10*3/uL (ref 0.0–0.1)
Basophils Relative: 0.5 % (ref 0.0–3.0)
Eosinophils Absolute: 0.1 10*3/uL (ref 0.0–0.7)
Eosinophils Relative: 1.2 % (ref 0.0–5.0)
HCT: 37.3 % (ref 36.0–46.0)
Hemoglobin: 12.2 g/dL (ref 12.0–15.0)
Lymphocytes Relative: 22.1 % (ref 12.0–46.0)
Lymphs Abs: 2.1 10*3/uL (ref 0.7–4.0)
MCHC: 32.7 g/dL (ref 30.0–36.0)
MCV: 89.2 fl (ref 78.0–100.0)
Monocytes Absolute: 0.5 10*3/uL (ref 0.1–1.0)
Monocytes Relative: 4.9 % (ref 3.0–12.0)
Neutro Abs: 6.7 10*3/uL (ref 1.4–7.7)
Neutrophils Relative %: 71.3 % (ref 43.0–77.0)
Platelets: 292 10*3/uL (ref 150.0–400.0)
RBC: 4.18 Mil/uL (ref 3.87–5.11)
RDW: 12.5 % (ref 11.5–15.5)
WBC: 9.4 10*3/uL (ref 4.0–10.5)

## 2021-03-24 LAB — COMPREHENSIVE METABOLIC PANEL
ALT: 10 U/L (ref 0–35)
AST: 16 U/L (ref 0–37)
Albumin: 4.2 g/dL (ref 3.5–5.2)
Alkaline Phosphatase: 48 U/L (ref 39–117)
BUN: 12 mg/dL (ref 6–23)
CO2: 25 mEq/L (ref 19–32)
Calcium: 10 mg/dL (ref 8.4–10.5)
Chloride: 103 mEq/L (ref 96–112)
Creatinine, Ser: 0.8 mg/dL (ref 0.40–1.20)
GFR: 71.39 mL/min (ref >60.00)
Glucose, Bld: 110 mg/dL — ABNORMAL HIGH (ref 70–99)
Potassium: 4.7 mEq/L (ref 3.5–5.1)
Sodium: 140 mEq/L (ref 135–145)
Total Bilirubin: 0.4 mg/dL (ref 0.2–1.2)
Total Protein: 7.1 g/dL (ref 6.0–8.3)

## 2021-03-24 LAB — T3, FREE: T3, Free: 2.9 pg/mL (ref 2.3–4.2)

## 2021-03-24 LAB — LIPID PANEL
Cholesterol: 139 mg/dL (ref 0–200)
HDL: 48.4 mg/dL (ref >39.00)
LDL Cholesterol: 51 mg/dL (ref 0–99)
NonHDL: 90.75
Total CHOL/HDL Ratio: 3
Triglycerides: 198 mg/dL — ABNORMAL HIGH (ref 0.0–149.0)
VLDL: 39.6 mg/dL (ref 0.0–40.0)

## 2021-03-24 LAB — IBC + FERRITIN
Ferritin: 35.2 ng/mL (ref 10.0–291.0)
Iron: 84 ug/dL (ref 42–145)
Saturation Ratios: 21.4 % (ref 20.0–50.0)
TIBC: 392 ug/dL (ref 250.0–450.0)
Transferrin: 280 mg/dL (ref 212.0–360.0)

## 2021-03-24 LAB — TSH: TSH: 2.31 u[IU]/mL (ref 0.35–5.50)

## 2021-03-24 LAB — T4, FREE: Free T4: 0.82 ng/dL (ref 0.60–1.60)

## 2021-03-24 LAB — HEMOGLOBIN A1C: Hgb A1c MFr Bld: 6.6 % — ABNORMAL HIGH (ref 4.6–6.5)

## 2021-03-25 NOTE — Progress Notes (Signed)
No critical labs need to be addressed urgently. We will discuss labs in detail at upcoming office visit.   

## 2021-04-01 ENCOUNTER — Encounter: Payer: Self-pay | Admitting: Family Medicine

## 2021-04-01 ENCOUNTER — Ambulatory Visit (INDEPENDENT_AMBULATORY_CARE_PROVIDER_SITE_OTHER): Payer: PPO | Admitting: Family Medicine

## 2021-04-01 ENCOUNTER — Other Ambulatory Visit: Payer: Self-pay

## 2021-04-01 VITALS — BP 130/80 | HR 73 | Temp 97.0°F | Ht 63.5 in | Wt 138.4 lb

## 2021-04-01 DIAGNOSIS — I152 Hypertension secondary to endocrine disorders: Secondary | ICD-10-CM | POA: Diagnosis not present

## 2021-04-01 DIAGNOSIS — Z23 Encounter for immunization: Secondary | ICD-10-CM

## 2021-04-01 DIAGNOSIS — E038 Other specified hypothyroidism: Secondary | ICD-10-CM

## 2021-04-01 DIAGNOSIS — E785 Hyperlipidemia, unspecified: Secondary | ICD-10-CM

## 2021-04-01 DIAGNOSIS — E1169 Type 2 diabetes mellitus with other specified complication: Secondary | ICD-10-CM

## 2021-04-01 DIAGNOSIS — E1159 Type 2 diabetes mellitus with other circulatory complications: Secondary | ICD-10-CM | POA: Diagnosis not present

## 2021-04-01 DIAGNOSIS — Z0000 Encounter for general adult medical examination without abnormal findings: Secondary | ICD-10-CM

## 2021-04-01 NOTE — Assessment & Plan Note (Signed)
Stable, chronic.  Continue current medication.   Good control on metformin BID. Followed by Dr. Chalmers Cater.

## 2021-04-01 NOTE — Assessment & Plan Note (Signed)
Stable, chronic.  Continue current medication.   At goal on metoprolol 50 mg XL daily

## 2021-04-01 NOTE — Assessment & Plan Note (Signed)
Stable, chronic.  Continue current medication.    Followed by ENDO  Lab Results  Component Value Date   TSH 2.31 03/24/2021

## 2021-04-01 NOTE — Progress Notes (Signed)
Patient ID: Valerie Vargas, female    DOB: [birth year], 75 y.o.   MRN: [id number]  This visit was conducted in person.  BP 130/80   Pulse 73   Temp (!) 97 F (36.1 C) (Temporal)   Ht 5' 3.5" (1.613 m)   Wt 138 lb 6 oz (62.8 kg)   SpO2 98%   BMI 24.13 kg/m    CC: Chief Complaint  Patient presents with   Medicare Wellness    Subjective:   HPI: Valerie Vargas is a 75 y.o. female presenting on 04/01/2021 for Medicare Wellness   The patient presents for annual medicare wellness, complete physical and review of chronic health problems. He/She also has the following acute concerns today: none  I have personally reviewed the Medicare Annual Wellness questionnaire and have noted 1. The patient's medical and social history 2. Their use of alcohol, tobacco or illicit drugs 3. Their current medications and supplements 4. The patient's functional ability including ADL's, fall risks, home safety risks and hearing or visual             impairment. 5. Diet and physical activities 6. Evidence for depression or mood disorders 7.         Updated provider list Cognitive evaluation was performed and recorded on pt medicare questionnaire form. The patients weight, height, BMI and visual acuity have been recorded in the chart   I have made referrals, counseling and provided education to the patient based review of the above and I have provided the pt with a written personalized care plan for preventive services.   Documentation of this information was scanned into the electronic record under the media tab.   Advance directives and end of life planning reviewed in detail with patient and documented in EMR. Patient given handout on advance care directives if needed. HCPOA and living will updated if needed.  Hearing Screening  Method: Audiometry   '500Hz'  '1000Hz'  '2000Hz'  '4000Hz'   Right ear 0 0 20 20  Left ear 0 0 20 20  Vision Screening - Comments:: Wear Glasses-Eye Exam with Jola Schmidt  12/2020  Flowsheet Row Office Visit from 04/01/2021 in [place] at [place]  PHQ-2 Total Score 0       Fall Risk  04/01/2021 03/20/2020 02/19/2019 02/09/2018 02/08/2017  Falls in the past year? 1 0 0 No No  Number falls in past yr: 0 0 - - -  Injury with Fall? 1 0 - - -  Risk for fall due to : - Medication side effect Medication side effect - -  Follow up - Falls evaluation completed;Falls prevention discussed Falls evaluation completed;Falls prevention discussed - -   Slipped  in shower.. resulted in fall and injury to hip.   Treated for Hylpori, s/p triple therapy.Marland Kitchen abdominal bloating now resolved.  Has upcoming colonoscopy in 05/2021 for irregular BMs.  Has lost 10 lbs. Wt Readings from Last 3 Encounters:  04/01/21 138 lb 6 oz (62.8 kg)  02/18/21 140 lb 8 oz (63.7 kg)  09/03/20 146 lb 8 oz (66.5 kg)     Hypertension:   At goal on metoprolol 50 mg XL daily BP Readings from Last 3 Encounters:  04/01/21 130/80  02/18/21 130/78  09/03/20 (!) 142/90  Using medication without problems or lightheadedness:  none Chest pain with exertion: none Edema:none Short of breath:none Average home BPs: Other issues:  Diabetes:  Followed by Dr. Chalmers Cater. Good control on metformin BID. Lab Results  Component Value Date  HGBA1C 6.6 (H) 03/24/2021  Using medications without difficulties: Hypoglycemic episodes: Hyperglycemic episodes: Feet problems: no ulcers Blood Sugars averaging: eye exam within last year: yes Dr. Valetta Close  Elevated Cholesterol:LDL at goal on Crestor 10 mg daily Lab Results  Component Value Date   CHOL 139 03/24/2021   HDL 48.40 03/24/2021   LDLCALC 51 03/24/2021   LDLDIRECT 70.0 02/09/2018   TRIG 198.0 (H) 03/24/2021   CHOLHDL 3 03/24/2021  Using medications without problems: Muscle aches:  Diet compliance:moderate Exercise: off and on since fall. Other complaints:      Relevant past medical, surgical, family and social history reviewed and updated  as indicated. Interim medical history since our last visit reviewed. Allergies and medications reviewed and updated. Outpatient Medications Prior to Visit  Medication Sig Dispense Refill   ALPRAZolam (XANAX) 0.5 MG tablet TAKE 1 TABLET BY MOUTH EVERY DAY AS NEEDED 10 tablet 0   Ascorbic Acid (VITAMIN C) 1000 MG tablet Take 1,000 mg by mouth daily.     benzonatate (TESSALON) 100 MG capsule TAKE TWO CAPSULES BY MOUTH 3 TIMES A DAYAS NEEDED FOR COUGH 60 capsule 1   blood glucose meter kit and supplies KIT Dispense based on patient and insurance preference. Use up to four times daily as directed. (FOR ICD-9 250.00, 250.01). 1 each 0   Cholecalciferol (VITAMIN D3) 1000 UNITS CAPS Take 1 capsule by mouth daily.     Cranberry 200 MG CAPS Take by mouth.     diphenhydrAMINE-PE-APAP 12.5-5-325 MG TABS Take 0.5 tablets by mouth as needed.     gabapentin (NEURONTIN) 100 MG tablet Take 300 mg by mouth 3 (three) times daily.     glucose blood (ONE TOUCH ULTRA TEST) test strip -TEST TWICE DAILY 100 each 6   Guaifenesin 1200 MG TB12 Take by mouth as needed.     L-Theanine 100 MG CAPS Take 2 tablets by mouth as needed.     levothyroxine (SYNTHROID, LEVOTHROID) 75 MCG tablet Take 75 mcg by mouth daily before breakfast. Brand Synthroid     loratadine (CLARITIN) 10 MG tablet Take 10 mg by mouth daily.     meloxicam (MOBIC) 7.5 MG tablet Take 1 tablet (7.5 mg total) by mouth daily. Take with food to avoid upset stomach 10 tablet 0   metFORMIN (GLUCOPHAGE-XR) 500 MG 24 hr tablet      metoprolol succinate (TOPROL-XL) 50 MG 24 hr tablet TAKE 1 TABLET BY MOUTH EVERY DAY 30 tablet 11   OMEGA-3 1000 MG CAPS Take 1,000 mg by mouth daily.     ONETOUCH DELICA LANCETS 11S MISC -TEST TWICE DAILY 100 each 5   OVER THE COUNTER MEDICATION BLADDER EASE     Pyridoxine HCl (VITAMIN B-6 PO) Take 1 tablet by mouth daily.     rosuvastatin (CRESTOR) 10 MG tablet TAKE 1 TABLET BY MOUTH EVERY DAY FOR CHOLESTEROL 90 tablet 0   Thiamine  HCl (VITAMIN B-1) 250 MG tablet Take 250 mg by mouth daily.     tolterodine (DETROL LA) 4 MG 24 hr capsule Take 1 capsule (4 mg total) by mouth daily. 30 capsule 11   vitamin B-12 (CYANOCOBALAMIN) 1000 MCG tablet Take 1,000 mcg by mouth daily.     MAGNESIUM GLYCINATE PLUS PO Take 1 capsule by mouth daily. (Patient not taking: No sig reported)     No facility-administered medications prior to visit.     Per HPI unless specifically indicated in ROS section below Review of Systems  Constitutional:  Negative for fatigue and  fever.  HENT:  Negative for congestion.   Eyes:  Negative for pain.  Respiratory:  Negative for cough and shortness of breath.   Cardiovascular:  Negative for chest pain, palpitations and leg swelling.  Gastrointestinal:  Negative for abdominal pain.  Genitourinary:  Negative for dysuria and vaginal bleeding.  Musculoskeletal:  Negative for back pain.  Neurological:  Negative for syncope, light-headedness and headaches.  Psychiatric/Behavioral:  Negative for dysphoric mood.   Objective:  BP 130/80   Pulse 73   Temp (!) 97 F (36.1 C) (Temporal)   Ht 5' 3.5" (1.613 m)   Wt 138 lb 6 oz (62.8 kg)   SpO2 98%   BMI 24.13 kg/m   Wt Readings from Last 3 Encounters:  04/01/21 138 lb 6 oz (62.8 kg)  02/18/21 140 lb 8 oz (63.7 kg)  09/03/20 146 lb 8 oz (66.5 kg)      Physical Exam Constitutional:      General: She is not in acute distress.    Appearance: Normal appearance. She is well-developed. She is not ill-appearing or toxic-appearing.  HENT:     Head: Normocephalic.     Right Ear: Hearing, tympanic membrane, ear canal and external ear normal. Tympanic membrane is not erythematous, retracted or bulging.     Left Ear: Hearing, tympanic membrane, ear canal and external ear normal. Tympanic membrane is not erythematous, retracted or bulging.     Nose: No mucosal edema or rhinorrhea.     Right Sinus: No maxillary sinus tenderness or frontal sinus tenderness.      Left Sinus: No maxillary sinus tenderness or frontal sinus tenderness.     Mouth/Throat:     Pharynx: Uvula midline.  Eyes:     General: Lids are normal. Lids are everted, no foreign bodies appreciated.     Conjunctiva/sclera: Conjunctivae normal.     Pupils: Pupils are equal, round, and reactive to light.  Neck:     Thyroid: No thyroid mass or thyromegaly.     Vascular: No carotid bruit.     Trachea: Trachea normal.  Cardiovascular:     Rate and Rhythm: Normal rate and regular rhythm.     Pulses: Normal pulses.     Heart sounds: Normal heart sounds, S1 normal and S2 normal. No murmur heard.   No friction rub. No gallop.  Pulmonary:     Effort: Pulmonary effort is normal. No tachypnea or respiratory distress.     Breath sounds: Normal breath sounds. No decreased breath sounds, wheezing, rhonchi or rales.  Abdominal:     General: Bowel sounds are normal.     Palpations: Abdomen is soft.     Tenderness: There is no abdominal tenderness.  Musculoskeletal:     Cervical back: Normal range of motion and neck supple.  Skin:    General: Skin is warm and dry.     Findings: No rash.  Neurological:     Mental Status: She is alert.  Psychiatric:        Mood and Affect: Mood is not anxious or depressed.        Speech: Speech normal.        Behavior: Behavior normal. Behavior is cooperative.        Thought Content: Thought content normal.        Judgment: Judgment normal.      Diabetic foot exam: Normal inspection No skin breakdown No calluses  Normal DP pulses Normal sensation to light touch and monofilament Nails normal  Results for  orders placed or performed in visit on 03/24/21  T3, free  Result Value Ref Range   T3, Free 2.9 2.3 - 4.2 pg/mL  T4, free  Result Value Ref Range   Free T4 0.82 0.60 - 1.60 ng/dL  TSH  Result Value Ref Range   TSH 2.31 0.35 - 5.50 uIU/mL  Comprehensive metabolic panel  Result Value Ref Range   Sodium 140 135 - 145 mEq/L   Potassium 4.7 3.5  - 5.1 mEq/L   Chloride 103 96 - 112 mEq/L   CO2 25 19 - 32 mEq/L   Glucose, Bld 110 (H) 70 - 99 mg/dL   BUN 12 6 - 23 mg/dL   Creatinine, Ser 0.80 0.40 - 1.20 mg/dL   Total Bilirubin 0.4 0.2 - 1.2 mg/dL   Alkaline Phosphatase 48 39 - 117 U/L   AST 16 0 - 37 U/L   ALT 10 0 - 35 U/L   Total Protein 7.1 6.0 - 8.3 g/dL   Albumin 4.2 3.5 - 5.2 g/dL   GFR 71.39 >60.00 mL/min   Calcium 10.0 8.4 - 10.5 mg/dL  Lipid panel  Result Value Ref Range   Cholesterol 139 0 - 200 mg/dL   Triglycerides 198.0 (H) 0.0 - 149.0 mg/dL   HDL 48.40 >39.00 mg/dL   VLDL 39.6 0.0 - 40.0 mg/dL   LDL Cholesterol 51 0 - 99 mg/dL   Total CHOL/HDL Ratio 3    NonHDL 90.75   Hemoglobin A1c  Result Value Ref Range   Hgb A1c MFr Bld 6.6 (H) 4.6 - 6.5 %  IBC + Ferritin  Result Value Ref Range   Iron 84 42 - 145 ug/dL   Transferrin 280.0 212.0 - 360.0 mg/dL   Saturation Ratios 21.4 20.0 - 50.0 %   Ferritin 35.2 10.0 - 291.0 ng/mL   TIBC 392.0 250.0 - 450.0 mcg/dL  CBC with Differential/Platelet  Result Value Ref Range   WBC 9.4 4.0 - 10.5 K/uL   RBC 4.18 3.87 - 5.11 Mil/uL   Hemoglobin 12.2 12.0 - 15.0 g/dL   HCT 37.3 36.0 - 46.0 %   MCV 89.2 78.0 - 100.0 fl   MCHC 32.7 30.0 - 36.0 g/dL   RDW 12.5 11.5 - 15.5 %   Platelets 292.0 150.0 - 400.0 K/uL   Neutrophils Relative % 71.3 43.0 - 77.0 %   Lymphocytes Relative 22.1 12.0 - 46.0 %   Monocytes Relative 4.9 3.0 - 12.0 %   Eosinophils Relative 1.2 0.0 - 5.0 %   Basophils Relative 0.5 0.0 - 3.0 %   Neutro Abs 6.7 1.4 - 7.7 K/uL   Lymphs Abs 2.1 0.7 - 4.0 K/uL   Monocytes Absolute 0.5 0.1 - 1.0 K/uL   Eosinophils Absolute 0.1 0.0 - 0.7 K/uL   Basophils Absolute 0.0 0.0 - 0.1 K/uL    This visit occurred during the SARS-CoV-2 public health emergency.  Safety protocols were in place, including screening questions prior to the visit, additional usage of staff PPE, and extensive cleaning of exam room while observing appropriate contact time as indicated for  disinfecting solutions.   COVID 19 screen:  No recent travel or known exposure to COVID19 The patient denies respiratory symptoms of COVID 19 at this time. The importance of social distancing was discussed today.   Assessment and Plan   The patient's preventative maintenance and recommended screening tests for an annual wellness exam were reviewed in full today. Brought up to date unless services declined.  Counselled on  the importance of diet, exercise, and its role in overall health and mortality. The patient's FH and SH was reviewed, including their home life, tobacco status, and drug and alcohol status.   Colon: 03/15/2013 nml.. Due for 10 year repeat in 2024. Nonsmoker DEXA:  Normal 04/2017, can repeat in 5 years.   Mammogram last 11/2020 PAP not indicated, DVE not indicated total hysterecotmy Vaccine: uptodate , COVID19  series x3 , given high dose flu shot today. Hep C neg  Problem List Items Addressed This Visit     Hyperlipidemia associated with type 2 diabetes mellitus ([place])    Stable, chronic.  Continue current medication.    Crestor 10 mg daily      Hypertension associated with diabetes (HCC)    Stable, chronic.  Continue current medication.   At goal on metoprolol 50 mg XL daily      Hypothyroidism    Stable, chronic.  Continue current medication.    Followed by ENDO  Lab Results  Component Value Date   TSH 2.31 03/24/2021        Type 2 diabetes mellitus with other circulatory complications HTN (HCC)    Stable, chronic.  Continue current medication.   Good control on metformin BID. Followed by Dr. Chalmers Cater.      Other Visit Diagnoses     Medicare annual wellness visit, subsequent    -  Primary   Need for influenza vaccination       Relevant Orders   Flu Vaccine QUAD High Dose(Fluad) (Completed)      Microalbumin will be obtained at  ENDO.  Eliezer Lofts, MD

## 2021-04-01 NOTE — Assessment & Plan Note (Signed)
Stable, chronic.  Continue current medication.   Crestor 10 mg daily 

## 2021-04-01 NOTE — Patient Instructions (Addendum)
Have urine microalbumin done at ENDO... send to [place] here  Look into getting new Bivalent  COVID vaccine.

## 2021-04-02 DIAGNOSIS — E039 Hypothyroidism, unspecified: Secondary | ICD-10-CM | POA: Diagnosis not present

## 2021-04-02 DIAGNOSIS — I10 Essential (primary) hypertension: Secondary | ICD-10-CM | POA: Diagnosis not present

## 2021-04-02 DIAGNOSIS — I951 Orthostatic hypotension: Secondary | ICD-10-CM | POA: Diagnosis not present

## 2021-04-02 DIAGNOSIS — R7989 Other specified abnormal findings of blood chemistry: Secondary | ICD-10-CM | POA: Diagnosis not present

## 2021-04-02 DIAGNOSIS — E7800 Pure hypercholesterolemia, unspecified: Secondary | ICD-10-CM | POA: Diagnosis not present

## 2021-04-02 DIAGNOSIS — E1165 Type 2 diabetes mellitus with hyperglycemia: Secondary | ICD-10-CM | POA: Diagnosis not present

## 2021-04-05 ENCOUNTER — Encounter: Payer: Self-pay | Admitting: Family Medicine

## 2021-04-12 ENCOUNTER — Other Ambulatory Visit: Payer: Self-pay | Admitting: Cardiovascular Disease

## 2021-04-19 ENCOUNTER — Telehealth: Payer: Self-pay

## 2021-04-19 NOTE — Progress Notes (Signed)
Chronic Care Management Pharmacy Assistant   Name: SHAUGHNESSY GETHERS  MRN: [id number] DOB: 11/24/1945  Reason for Encounter: Hypertension Disease State   Recent office visits:  04/01/2021 - Eliezer Lofts, MD - Patient presented for [facility] Annual Wellness.  No medication changes.  02/26/2021 - Eliezer Lofts, MD - Orders - Start: Tolterodine (DETROL LA) 4 MG 24 hr capsule - Take 1 capsule by mouth daily. No other information given.  Recent consult visits:  04/02/2021 - [place] - Patient presented for 1 year follow-up for HTN, HLD, DM, abnormal liver function test and hypothyroidism. No medication changes found it chart. Patient states she was taken of Metformin by Dr. Chalmers Cater on 04/02/2021 due to diarrhea. Patient is to stay off of it for two weeks and then reassess if she will go back on it. Patient states she would like to stay off it and switch to another medication as her diarrhea has gotten better.    Hospital visits:  None in previous 6 months  Medications: Outpatient Encounter Medications as of 04/19/2021  Medication Sig   ALPRAZolam (XANAX) 0.5 MG tablet TAKE 1 TABLET BY MOUTH EVERY DAY AS NEEDED   Ascorbic Acid (VITAMIN C) 1000 MG tablet Take 1,000 mg by mouth daily.   benzonatate (TESSALON) 100 MG capsule TAKE TWO CAPSULES BY MOUTH 3 TIMES A DAYAS NEEDED FOR COUGH   blood glucose meter kit and supplies KIT Dispense based on patient and insurance preference. Use up to four times daily as directed. (FOR ICD-9 250.00, 250.01).   Cholecalciferol (VITAMIN D3) 1000 UNITS CAPS Take 1 capsule by mouth daily.   Cranberry 200 MG CAPS Take by mouth.   diphenhydrAMINE-PE-APAP 12.5-5-325 MG TABS Take 0.5 tablets by mouth as needed.   gabapentin (NEURONTIN) 100 MG tablet Take 300 mg by mouth 3 (three) times daily.   glucose blood (ONE TOUCH ULTRA TEST) test strip -TEST TWICE DAILY   Guaifenesin 1200 MG TB12 Take by mouth as needed.   L-Theanine 100 MG CAPS  Take 2 tablets by mouth as needed.   levothyroxine (SYNTHROID, LEVOTHROID) 75 MCG tablet Take 75 mcg by mouth daily before breakfast. Brand Synthroid   loratadine (CLARITIN) 10 MG tablet Take 10 mg by mouth daily.   MAGNESIUM GLYCINATE PLUS PO Take 1 capsule by mouth daily. (Patient not taking: No sig reported)   meloxicam (MOBIC) 7.5 MG tablet Take 1 tablet (7.5 mg total) by mouth daily. Take with food to avoid upset stomach   metFORMIN (GLUCOPHAGE-XR) 500 MG 24 hr tablet    metoprolol succinate (TOPROL-XL) 50 MG 24 hr tablet TAKE 1 TABLET BY MOUTH EVERY DAY   OMEGA-3 1000 MG CAPS Take 1,000 mg by mouth daily.   ONETOUCH DELICA LANCETS 70W MISC -TEST TWICE DAILY   OVER THE COUNTER MEDICATION BLADDER EASE   Pyridoxine HCl (VITAMIN B-6 PO) Take 1 tablet by mouth daily.   rosuvastatin (CRESTOR) 10 MG tablet TAKE 1 TABLET BY MOUTH EVERY DAY FOR CHOLESTEROL   Thiamine HCl (VITAMIN B-1) 250 MG tablet Take 250 mg by mouth daily.   tolterodine (DETROL LA) 4 MG 24 hr capsule Take 1 capsule (4 mg total) by mouth daily.   vitamin B-12 (CYANOCOBALAMIN) 1000 MCG tablet Take 1,000 mcg by mouth daily.   No facility-administered encounter medications on file as of 04/19/2021.    Recent Office Vitals: BP Readings from Last 3 Encounters:  04/01/21 130/80  02/18/21 130/78  09/03/20 (!) 142/90   Pulse Readings from  Last 3 Encounters:  04/01/21 73  02/18/21 83  09/03/20 84    Wt Readings from Last 3 Encounters:  04/01/21 138 lb 6 oz (62.8 kg)  02/18/21 140 lb 8 oz (63.7 kg)  09/03/20 146 lb 8 oz (66.5 kg)     Kidney Function Lab Results  Component Value Date/Time   CREATININE 0.80 03/24/2021 09:48 AM   CREATININE 0.82 09/03/2020 01:32 PM   GFR 71.39 03/24/2021 09:48 AM   GFRNONAA 61 03/19/2020 12:00 AM   GFRAA 70 03/19/2020 12:00 AM    BMP Latest Ref Rng & Units 03/24/2021 09/03/2020 03/19/2020  Glucose 70 - 99 mg/dL 110(H) 103(H) -  BUN 6 - 23 mg/dL '12 9 13  ' Creatinine 0.40 - 1.20 mg/dL 0.80  0.82 0.9  Sodium 135 - 145 mEq/L 140 138 141  Potassium 3.5 - 5.1 mEq/L 4.7 4.8 4.8  Chloride 96 - 112 mEq/L 103 104 103  CO2 19 - 32 mEq/L 25 28 23(A)  Calcium 8.4 - 10.5 mg/dL 10.0 9.7 9.7   Contacted patient on 04/19/2021 to discuss hypertension disease state  Current antihypertensive regimen:  Metoprolol 66m XL - Take 1 tablet by mouth every day.  Patient verbally confirms she is taking the above medications as directed. Yes  How often are you checking your Blood Pressure? daily  she checks her blood pressure in the afternoon before taking her medication.  Current home BP readings: Patient does not log her readings. She knows her readings are typically 120/65.  Wrist or arm cuff: Wrist Caffeine intake: Patient states she does not drink caffeine Salt intake:Patient states she watches how much salt she uses.  OTC medications including pseudoephedrine or NSAIDs? Tylenol because of fall - 2 (5069m in the morning and evening.   Any readings above 180/120? No  What recent interventions/DTPs have been made by any provider to improve Blood Pressure control since last CPP Visit: None. Stable; continue current medication.   Any recent hospitalizations or ED visits since last visit with CPP? No  What diet changes have been made to improve Blood Pressure Control?  Patient states she can not eat a lot due to H Pylori in April.   What exercise is being done to improve your Blood Pressure Control?  Patient states due to her fall in May she can not do much exercise.   Adherence Review: Is the patient currently on ACE/ARB medication? No Does the patient have >5 day gap between last estimated fill dates? No  Star Rating Drugs:  Medication:  Last Fill: Day Supply Rosuvastatin 1027m7/26/2022 90   Care Gaps: Annual wellness visit in last year? Yes 04/01/2021 Most Recent BP reading: 130/80 on 04/01/2021  If Diabetic: Most recent A1C reading: 6.6 on 03/24/2021 Last eye exam /  retinopathy screening: 12/2020 Last diabetic foot exam: 04/01/2021  Patient states she was taken of Metformin by Dr. BalChalmers Cater09/16/2022 due to diarrhea. Patient is to stay off of it for two weeks and then reassess if she will go back on it. Patient states she would like to stay off it and switch to another medication as her diarrhea has gotten better.    No appointments scheduled within the next 30 days.  LinCharlene BrookePP notified  AmyMarijean NiemannMAUtahinical Pharmacy Assistant [phone] Spent: 40 minutes

## 2021-04-28 DIAGNOSIS — M25511 Pain in right shoulder: Secondary | ICD-10-CM | POA: Diagnosis not present

## 2021-04-30 NOTE — Telephone Encounter (Signed)
Per patient, metformin was discontinued by Dr Chalmers Cater. Removed from medication list.

## 2021-04-30 NOTE — Addendum Note (Signed)
Addended by: Charlton Haws on: 04/30/2021 02:50 PM   Modules accepted: Orders

## 2021-05-02 DIAGNOSIS — R42 Dizziness and giddiness: Secondary | ICD-10-CM | POA: Diagnosis not present

## 2021-05-02 DIAGNOSIS — S0181XA Laceration without foreign body of other part of head, initial encounter: Secondary | ICD-10-CM | POA: Diagnosis not present

## 2021-05-02 DIAGNOSIS — S0990XA Unspecified injury of head, initial encounter: Secondary | ICD-10-CM | POA: Diagnosis not present

## 2021-05-02 DIAGNOSIS — S199XXA Unspecified injury of neck, initial encounter: Secondary | ICD-10-CM | POA: Diagnosis not present

## 2021-05-02 DIAGNOSIS — W01198A Fall on same level from slipping, tripping and stumbling with subsequent striking against other object, initial encounter: Secondary | ICD-10-CM | POA: Diagnosis not present

## 2021-05-02 DIAGNOSIS — R55 Syncope and collapse: Secondary | ICD-10-CM | POA: Diagnosis not present

## 2021-05-02 DIAGNOSIS — M4312 Spondylolisthesis, cervical region: Secondary | ICD-10-CM | POA: Diagnosis not present

## 2021-05-02 DIAGNOSIS — I10 Essential (primary) hypertension: Secondary | ICD-10-CM | POA: Diagnosis not present

## 2021-05-02 DIAGNOSIS — R0902 Hypoxemia: Secondary | ICD-10-CM | POA: Diagnosis not present

## 2021-05-02 DIAGNOSIS — Y998 Other external cause status: Secondary | ICD-10-CM | POA: Diagnosis not present

## 2021-05-03 DIAGNOSIS — R55 Syncope and collapse: Secondary | ICD-10-CM | POA: Diagnosis not present

## 2021-05-04 ENCOUNTER — Ambulatory Visit: Payer: PPO | Admitting: Cardiovascular Disease

## 2021-05-04 ENCOUNTER — Encounter: Payer: Self-pay | Admitting: Cardiovascular Disease

## 2021-05-04 ENCOUNTER — Other Ambulatory Visit: Payer: Self-pay

## 2021-05-04 VITALS — BP 146/90 | HR 68 | Ht 63.5 in | Wt 135.0 lb

## 2021-05-04 DIAGNOSIS — R55 Syncope and collapse: Secondary | ICD-10-CM | POA: Diagnosis not present

## 2021-05-04 DIAGNOSIS — E236 Other disorders of pituitary gland: Secondary | ICD-10-CM | POA: Diagnosis not present

## 2021-05-04 DIAGNOSIS — G4733 Obstructive sleep apnea (adult) (pediatric): Secondary | ICD-10-CM

## 2021-05-04 DIAGNOSIS — E782 Mixed hyperlipidemia: Secondary | ICD-10-CM | POA: Diagnosis not present

## 2021-05-04 DIAGNOSIS — R030 Elevated blood-pressure reading, without diagnosis of hypertension: Secondary | ICD-10-CM

## 2021-05-04 DIAGNOSIS — E119 Type 2 diabetes mellitus without complications: Secondary | ICD-10-CM

## 2021-05-04 NOTE — Patient Instructions (Signed)
Medication Instructions:  Continue same medications. *If you need a refill on your cardiac medications before your next appointment, please call your pharmacy*   Lab Work: None ordered.   Testing/Procedures: None ordered.   Follow-Up: At [facility], you and your health needs are our priority.  As part of our continuing mission to provide you with exceptional heart care, we have created designated Provider Care Teams.  These Care Teams include your primary Cardiologist (physician) and Advanced Practice Providers (APPs -  Physician Assistants and Nurse Practitioners) who all work together to provide you with the care you need, when you need it.  We recommend signing up for the patient portal called "MyChart".  Sign up information is provided on this After Visit Summary.  MyChart is used to connect with patients for Virtual Visits (Telemedicine).  Patients are able to view lab/test results, encounter notes, upcoming appointments, etc.  Non-urgent messages can be sent to your provider as well.   To learn more about what you can do with MyChart, go to [email or web address].    Your next appointment:   12 month(s)  The format for your next appointment:   In Person  Provider:   You may see Sanda Klein, MD or one of the following Advanced Practice Providers on your designated Care Team:   Almyra Deforest, PA-C Fabian Sharp, [place] or  Roby Lofts, [place]   Other Instructions Referral has been placed with Dr. Claiborne Billings for sleep study.

## 2021-05-04 NOTE — Progress Notes (Signed)
Patient ID: Valerie Vargas, female   DOB: [birth year] Vargas, 75 y.o.   Valerie: [id number]    Cardiology Office Note    Date:  05/04/2021   ID:  Valerie Vargas, Valerie Vargas, Valerie Vargas, Valerie Vargas  PCP:  Jinny Sanders, MD  Cardiologist:   Sanda Klein, MD   Chief Complaint  Patient presents with   Follow-up    12 months.   Shortness of Breath   Loss of Consciousness    Last Sunday.    History of Present Illness:  Valerie Vargas is a 75 y.o. female with OSA, a remote history of syncope in 2007 and negative subsequent workup, minor coronary artery disease by angiography in 2003, hypertension, hyperlipidemia, type 2 diabetes mellitus.  She has had a syncopal event for the first time in about 15 years.  This occurred while she was taking a hot shower.  She felt the typical prodromal symptoms and sat down on the shower chair.  She tried to get up with the symptoms recur so she sat down again for a longer period of time.  She got up once more and this time felt better but lost consciousness as she was stepping out of the shower.  She hit her head against the knob of the vanity and had an extensive laceration of her forehead and scalp.  Thankfully there was no intracranial bleeding and no skull fracture.  Incidental note made of empty sella.  She received multiple stitches.  Syncopal event was preceded by about a couple of weeks of "feeling bad" with increased frequency of soft stools and symptoms of worsening seasonal allergies (tearing, runny nose, sneezing, etc.) for which she took NyQuil.  She has been recording her blood pressure and this has been largely in normal range, occasionally slightly high.  The patient specifically denies any chest pain at rest exertion, dyspnea at rest or with exertion, orthopnea, paroxysmal nocturnal dyspnea,  palpitations, focal neurological deficits, intermittent claudication, lower extremity edema, unexplained weight gain, cough, hemoptysis or wheezing.  She has had  vision problems related to a macular hole in her dominant right eye (her left eye has problems due to a childhood injury).  Even after cataract surgery she still only has 20/80 vision and she has been avoiding driving.  She has seen a retina specialist in hopes to see some improvement with  She is unable to wear her CPAP now due to the scalp laceration.  She wears a oxygen saturation monitor coupled to her smart phone which showed an AHI of about 38 over the last few nights. Even before the injury, she states her AHI was "high". Often has trouble with mask leaks. She describes her self sleepwalker and often wakes up with the CPAP removed without remembering taking it off. She needs to reestablish follow-up with a sleep specialist.  We will delay this until she can wear her CPAP equipment again.  Recent lipid profile showed favorable findings with an LDL of 51 and hemoglobin A1c of 6.6%.  As before, she has mild hypertriglyceridemia at 198 but the HDL is acceptable at 48.  She has normal renal function.  In the past a tilt table test demonstrated that she had orthostatic hypotension. Normal Vascuscreen (minimal carotid plaque, normal ABI, no AAA) in May 2018.    Past Medical History:  Diagnosis Date   Allergy, unspecified not elsewhere classified    Chronic interstitial cystitis    Cough    Esophageal reflux    H/O syncope 2007  No recurrence;Tilt table 09/23/05-negative   Hyperlipidemia 08/22/2014   Hypertension    Hypertriglyceridemia    Irritable bowel syndrome    Nonocclusive coronary atherosclerosis of native coronary artery    Obstructive sleep apnea (adult) (pediatric)    Paroxysmal ventricular tachycardia    Type II or unspecified type diabetes mellitus without mention of complication, not stated as uncontrolled    Unspecified asthma(493.90)    Unspecified hypothyroidism    Ventricular tachycardia     Past Surgical History:  Procedure Laterality Date   ABDOMINAL HYSTERECTOMY   11/1979   for endometriosis   CHOLECYSTECTOMY  07/2000   ESOPHAGOGASTRODUODENOSCOPY ENDOSCOPY  2010    Outpatient Medications Prior to Visit  Medication Sig Dispense Refill   ALPRAZolam (XANAX) 0.5 MG tablet TAKE 1 TABLET BY MOUTH EVERY DAY AS NEEDED 10 tablet 0   Ascorbic Acid (VITAMIN C) 1000 MG tablet Take 1,000 mg by mouth daily.     benzonatate (TESSALON) 100 MG capsule TAKE TWO CAPSULES BY MOUTH 3 TIMES A DAYAS NEEDED FOR COUGH 60 capsule 1   blood glucose meter kit and supplies KIT Dispense based on patient and insurance preference. Use up to four times daily as directed. (FOR ICD-9 250.00, 250.01). 1 each 0   Cholecalciferol (VITAMIN D3) 1000 UNITS CAPS Take 1 capsule by mouth daily.     Cranberry 200 MG CAPS Take by mouth.     diphenhydrAMINE-PE-APAP 12.5-5-325 MG TABS Take 0.5 tablets by mouth as needed.     gabapentin (NEURONTIN) 100 MG tablet Take 300 mg by mouth 3 (three) times daily.     glucose blood (ONE TOUCH ULTRA TEST) test strip -TEST TWICE DAILY 100 each 6   Guaifenesin 1200 MG TB12 Take by mouth as needed.     L-Theanine 100 MG CAPS Take 2 tablets by mouth as needed.     levothyroxine (SYNTHROID, LEVOTHROID) 75 MCG tablet Take 75 mcg by mouth daily before breakfast. Brand Synthroid     loratadine (CLARITIN) 10 MG tablet Take 10 mg by mouth daily.     MAGNESIUM GLYCINATE PLUS PO Take 1 capsule by mouth daily.     meloxicam (MOBIC) 7.5 MG tablet Take 1 tablet (7.5 mg total) by mouth daily. Take with food to avoid upset stomach 10 tablet 0   metoprolol succinate (TOPROL-XL) 50 MG 24 hr tablet TAKE 1 TABLET BY MOUTH EVERY DAY 30 tablet 2   OMEGA-3 1000 MG CAPS Take 1,000 mg by mouth daily.     ONETOUCH DELICA LANCETS 85I MISC -TEST TWICE DAILY 100 each 5   OVER THE COUNTER MEDICATION BLADDER EASE     Pyridoxine HCl (VITAMIN B-6 PO) Take 1 tablet by mouth daily.     rosuvastatin (CRESTOR) 10 MG tablet TAKE 1 TABLET BY MOUTH EVERY DAY FOR CHOLESTEROL 90 tablet 0   Thiamine  HCl (VITAMIN B-1) 250 MG tablet Take 250 mg by mouth daily.     tolterodine (DETROL LA) 4 MG 24 hr capsule Take 1 capsule (4 mg total) by mouth daily. 30 capsule 11   vitamin B-12 (CYANOCOBALAMIN) 1000 MCG tablet Take 1,000 mcg by mouth daily.     No facility-administered medications prior to visit.     Allergies:   Elmiron [pentosan polysulfate sodium], Hctz [hydrochlorothiazide], Penicillins, and Sulfa antibiotics   Social History   Socioeconomic History   Marital status: Married    Spouse name: Not on file   Number of children: Not on file   Years of education: Not on  file   Highest education level: Not on file  Occupational History   Occupation: retired  Tobacco Use   Smoking status: Never   Smokeless tobacco: Never  Vaping Use   Vaping Use: Never used  Substance and Sexual Activity   Alcohol use: No   Drug use: No   Sexual activity: Yes  Other Topics Concern   Not on file  Social History Narrative   Not on file   Social Determinants of Health   Financial Resource Strain: Low Risk    Difficulty of Paying Living Expenses: Not hard at all  Food Insecurity: Not on file  Transportation Needs: Not on file  Physical Activity: Not on file  Stress: Not on file  Social Connections: Not on file     Family History:  The patient's family history includes Arthritis in her mother; Breast cancer in her maternal aunt, mother, and paternal aunt; Diabetes in her father and mother; Heart disease in her father; Leukemia in her brother; Stomach cancer in her father.   ROS:   Please see the history of present illness.    ROS  All other systems are reviewed and are negative  PHYSICAL EXAM:   VS:  BP (!) 146/90 (BP Location: Left Arm, Patient Position: Sitting, Cuff Size: Normal)   Pulse 68   Ht 5' 3.5" (1.613 m)   Wt 135 lb (61.2 kg)   BMI 23.54 kg/m      General: Alert, oriented x3, no distress, extensive bandage for laceration of her forehead, left periorbital  hematoma Head: no evidence of trauma, PERRL, EOMI, no exophtalmos or lid lag, no myxedema, no xanthelasma; normal ears, nose and oropharynx Neck: normal jugular venous pulsations and no hepatojugular reflux; brisk carotid pulses without delay and no carotid bruits Chest: clear to auscultation, no signs of consolidation by percussion or palpation, normal fremitus, symmetrical and full respiratory excursions Cardiovascular: normal position and quality of the apical impulse, regular rhythm, normal first and second heart sounds, no murmurs, rubs or gallops Abdomen: no tenderness or distention, no masses by palpation, no abnormal pulsatility or arterial bruits, normal bowel sounds, no hepatosplenomegaly Extremities: no clubbing, cyanosis or edema; 2+ radial, ulnar and brachial pulses bilaterally; 2+ right femoral, posterior tibial and dorsalis pedis pulses; 2+ left femoral, posterior tibial and dorsalis pedis pulses; no subclavian or femoral bruits Neurological: grossly nonfocal Psych: Normal mood and affect   Wt Readings from Last 3 Encounters:  05/04/21 135 lb (61.2 kg)  04/01/21 138 lb 6 oz (62.8 kg)  02/18/21 140 lb 8 oz (63.7 kg)     Studies/Labs Reviewed:   EKG:  EKG is ordered today.  Personally reviewed, shows normal sinus rhythm with a single PVC, otherwise normal Recent Labs:  03/19/2020 (Dr. Chalmers Cater) hemoglobin A1c of 6.6%, fasting glucose 109  mild residual hypertriglyceridemia (even this is improved at 164).  HDL 49, LDL 55, total cholesterol 138.  Lipid Panel    Component Value Date/Time   CHOL 139 03/24/2021 0948   TRIG 198.0 (H) 03/24/2021 0948   HDL 48.40 03/24/2021 0948   CHOLHDL 3 03/24/2021 0948   VLDL 39.6 03/24/2021 0948   LDLCALC 51 03/24/2021 0948   LDLDIRECT 70.0 02/09/2018 1033   February 25, 2019 total cholesterol 140, triglyceride 191, HDL 46, LDL 56  ASSESSMENT:    1. Neurocardiogenic syncope   2. Elevated blood pressure reading   3. Mixed hyperlipidemia    4. Diabetes mellitus type 2 in nonobese (HCC)   5. OSA (obstructive  sleep apnea)   6. Empty sella syndrome ([place])      PLAN:  In order of problems listed above:  Syncope: The pattern remains highly suggestive of hypotensive syncope due to vasovagal response (long prodrome, positional symptoms, during the hot shower).  Recommended avoiding excessively warm environments including hot showers, staying very well-hydrated, and a diet with liberal salt intake.   Elevated BP: Her blood pressure is usually normal or low, often mild whitecoat hypertension. HLP: All lipid parameters are acceptable.  Persistent mild hypertriglyceridemia. DM: Well-controlled.  Metformin has recently stopped due to problems with diarrhea. OSA: Recommend follow-up with Dr. Claiborne Billings in the sleep clinic once she is able to wear her CPAP equipment again after the healing of her scalp laceration. Empty sella: Uncertain if this is clinically relevant to her current complaints.  We reviewed that this may be an incidental radiological finding that has no impact on her health.having said that, panhypopituitarism might explain her tendency to syncope, although this is more likely a neurally mediated phenomenon.  She is seeing Dr. Chalmers Cater for diabetes management and I encouraged her to discuss additional review of this abnormality with her.    Medication Adjustments/Labs and Tests Ordered: Current medicines are reviewed at length with the patient today.  Concerns regarding medicines are outlined above.  Medication changes, Labs and Tests ordered today are listed in the Patient Instructions below. Patient Instructions  Medication Instructions:  Continue same medications. *If you need a refill on your cardiac medications before your next appointment, please call your pharmacy*   Lab Work: None ordered.   Testing/Procedures: None ordered.   Follow-Up: At [facility], you and your health needs are our priority.  As part of our  continuing mission to provide you with exceptional heart care, we have created designated Provider Care Teams.  These Care Teams include your primary Cardiologist (physician) and Advanced Practice Providers (APPs -  Physician Assistants and Nurse Practitioners) who all work together to provide you with the care you need, when you need it.  We recommend signing up for the patient portal called "MyChart".  Sign up information is provided on this After Visit Summary.  MyChart is used to connect with patients for Virtual Visits (Telemedicine).  Patients are able to view lab/test results, encounter notes, upcoming appointments, etc.  Non-urgent messages can be sent to your provider as well.   To learn more about what you can do with MyChart, go to [email or web address].    Your next appointment:   12 month(s)  The format for your next appointment:   In Person  Provider:   You may see Sanda Klein, MD or one of the following Advanced Practice Providers on your designated Care Team:   Almyra Deforest, PA-C Fabian Sharp, [place] or  Roby Lofts, [place]   Other Instructions Referral has been placed with Dr. Claiborne Billings for sleep study.      Signed, Sanda Klein, MD  05/04/2021 12:32 PM    [address], [place], [place]  [address] Phone: [phone]; Fax: [phone]

## 2021-05-05 DIAGNOSIS — M79632 Pain in left forearm: Secondary | ICD-10-CM | POA: Diagnosis not present

## 2021-05-05 DIAGNOSIS — M25512 Pain in left shoulder: Secondary | ICD-10-CM | POA: Diagnosis not present

## 2021-05-10 DIAGNOSIS — M25511 Pain in right shoulder: Secondary | ICD-10-CM | POA: Diagnosis not present

## 2021-05-10 DIAGNOSIS — M25512 Pain in left shoulder: Secondary | ICD-10-CM | POA: Diagnosis not present

## 2021-05-14 DIAGNOSIS — M25511 Pain in right shoulder: Secondary | ICD-10-CM | POA: Diagnosis not present

## 2021-05-14 DIAGNOSIS — M25512 Pain in left shoulder: Secondary | ICD-10-CM | POA: Diagnosis not present

## 2021-05-21 ENCOUNTER — Telehealth: Payer: Self-pay

## 2021-05-21 NOTE — Progress Notes (Signed)
    Chronic Care Management Pharmacy Assistant   Name: Valerie Vargas  MRN: [id number] DOB: [birth year]   Reason for Encounter: CCM (Rosuvastatin Adherence)    05/21/2021 - Called patient as her last fill date for Rosuvastatin was 02/09/2021 for 90ds. Verified the last fill date with [facility]. Patient states she has 10 remaining in her bottle. She states she could have missed a couple of days due to her fall on 05/02/2021. Patient can tell she is getting better; states she just needs to recuperate all the way. She is going to call [facility] today to get it refilled and she will make sure she takes it every day. Patient also stated Bindubal Balan, provider, took her off Metformin on 04/02/2021 due to diarrhea. She is still off of it and the diarrhea has resolved. Bindubal Balan wants to keep her off of it and monitor to see if she needs to go back on it.   Time Spent: 30 Minutes  Debbora Dus, CPP notified  Marijean Niemann, [place] Assistant [phone]

## 2021-05-28 ENCOUNTER — Telehealth: Payer: PPO

## 2021-05-28 NOTE — Progress Notes (Deleted)
Chronic Care Management Pharmacy Note  05/28/2021 Name:  Valerie Vargas MRN:  [id number] DOB:  [birth year]  Summary: -Pt reports urinary incontinence w/ accidents, frequent urination, trouble emptying bladder completely; she does have history of interstitial cystitis and did not tolerate Elmiron, but she has not tried Overactive Bladder medications before  Recommendations/Changes made from today's visit: -Recommend trial of tolterodine ER 4 mg (preferred formulary agent)   Subjective: Valerie Vargas is an 75 y.o. year old female who is a primary patient of Bedsole, Amy E, MD.  The CCM team was consulted for assistance with disease management and care coordination needs.    Engaged with patient by telephone for follow up visit in response to provider referral for pharmacy case management and/or care coordination services.   Consent to Services:  The patient was given information about Chronic Care Management services, agreed to services, and gave verbal consent prior to initiation of services.  Please see initial visit note for detailed documentation.   Patient Care Team: Jinny Sanders, MD as PCP - General (Family Medicine) Croitoru, Dani Gobble, MD as PCP - Cardiology (Cardiology) Jacelyn Pi, MD as Attending Physician (Endocrinology) Ernestine Conrad, MD as Referring Physician (Otolaryngology) Jola Schmidt, MD as Consulting Physician (Ophthalmology) Levin Erp, DDS, PA as Referring Physician (Dentistry) Debbora Dus, [facility] as Pharmacist (Pharmacist)  Patient retired from [employer] in 2014. She lives with husband Mikki Santee, this is her 2nd marriage, they are both widowed. She reports her family has a history of high triglycerides and stomach issues.  Recent office visits: 04/01/21 Dr Diona Browner OV: AWV - no med changes. Foot exam done. Have urine/MA done at Endo. Advised Bivalent covid vaccine.  09/03/20 Dr. Eliezer Lofts (PCP) Bloating Medications changes: Metronidazole 500  mg Orders: CMP, C, difficile GDH and toxin A/B, CBC, gastrointestinal pathogen panel and lipase  Recent consult visits: 05/04/21 Dr Sallyanne Kuster (cardiology): f/u neurocardiogenic syncope. C/w hypotensive syncope d/t vasovagal response. Advised to avoid hot showers, stay hydrated, high-salt diet.  04/02/21 Dr Chalmers Cater (endocrine): f/u DM. 02/18/21 NP Karl Ito (pain mgmt): s/p fall May 2022, hip pain. Rx'd meloxicam. D/c metronidazole, nystatin, scopolamine (completed course). Pt is seeing chiropractor, using Tylenol, Voltaren gel, Biofreeze. 02/15/21 Dr Oren Beckmann (GI) - endoscopy 01/06/21 Dr Oren Beckmann (GI): f/u bloating, gastroparesis 12/31/20 Dr Iona Hansen (Retina): f/u vitreous degeneration  Hospital visits: Medication Reconciliation was completed by comparing discharge summary, patient's EMR and Pharmacy list, and upon discussion with patient.  Admitted to the ED on 05/02/21 due to Fall. Discharge date was 05/02/21. Discharged from [place] in shower due to dizziness. CT scan reassuring. Wound sutured.   Medications that remain the same after Hospital Discharge:??  -All other medications will remain the same.     Objective:  Lab Results  Component Value Date   CREATININE 0.80 03/24/2021   BUN 12 03/24/2021   GFR 71.39 03/24/2021   GFRNONAA 61 03/19/2020   GFRAA 70 03/19/2020   NA 140 03/24/2021   K 4.7 03/24/2021   CALCIUM 10.0 03/24/2021   CO2 25 03/24/2021   GLUCOSE 110 (H) 03/24/2021    Lab Results  Component Value Date/Time   HGBA1C 6.6 (H) 03/24/2021 09:48 AM   HGBA1C 6.6 03/19/2020 12:00 AM   HGBA1C 6.3 02/20/2019 12:00 AM   GFR 71.39 03/24/2021 09:48 AM   GFR 69.58 09/03/2020 01:32 PM   MICROALBUR <7 02/20/2019 12:00 AM   MICROALBUR <0.7 02/09/2018 10:33 AM    Last diabetic Eye exam:  Lab Results  Component Value Date/Time   HMDIABEYEEXA No Retinopathy 12/07/2020 12:00 AM    Last diabetic Foot exam:  Lab Results  Component Value Date/Time   HMDIABFOOTEX  done 03/22/2021 12:00 AM     Lab Results  Component Value Date   CHOL 139 03/24/2021   HDL 48.40 03/24/2021   LDLCALC 51 03/24/2021   LDLDIRECT 70.0 02/09/2018   TRIG 198.0 (H) 03/24/2021   CHOLHDL 3 03/24/2021    Hepatic Function Latest Ref Rng & Units 03/24/2021 09/03/2020 03/19/2020  Total Protein 6.0 - 8.3 g/dL 7.1 7.4 -  Albumin 3.5 - 5.2 g/dL 4.2 4.6 -  AST 0 - 37 U/L 16 32 33  ALT 0 - 35 U/L '10 24 27  ' Alk Phosphatase 39 - 117 U/L 48 43 56  Total Bilirubin 0.2 - 1.2 mg/dL 0.4 0.5 -    Lab Results  Component Value Date/Time   TSH 2.31 03/24/2021 09:48 AM   TSH 0.54 02/09/2018 10:33 AM   FREET4 0.82 03/24/2021 09:48 AM   FREET4 0.89 02/09/2018 10:33 AM    CBC Latest Ref Rng & Units 03/24/2021 09/03/2020 02/09/2018  WBC 4.0 - 10.5 K/uL 9.4 7.2 7.4  Hemoglobin 12.0 - 15.0 g/dL 12.2 12.7 13.1  Hematocrit 36.0 - 46.0 % 37.3 37.4 38.7  Platelets 150.0 - 400.0 K/uL 292.0 206.0 215.0    No results found for: VD25OH  Clinical ASCVD: Yes  The 10-year ASCVD risk score (Arnett DK, et al., 2019) is: 62.5%   Values used to calculate the score:     Age: 75 years     Sex: Female     Is Non-Hispanic African American: No     Diabetic: Yes     Tobacco smoker: No     Systolic Blood Pressure: 888 mmHg     Is BP treated: Yes     HDL Cholesterol: 48.4 mg/dL     Total Cholesterol: 139 mg/dL    Depression screen [facility] 2/9 04/01/2021 03/20/2020 02/19/2019  Decreased Interest 0 0 0  Down, Depressed, Hopeless 0 0 0  PHQ - 2 Score 0 0 0  Altered sleeping - 0 3  Tired, decreased energy - 0 3  Change in appetite - 0 0  Feeling bad or failure about yourself  - 0 0  Trouble concentrating - 0 0  Moving slowly or fidgety/restless - 0 0  Suicidal thoughts - 0 0  PHQ-9 Score - 0 6  Difficult doing work/chores - Not difficult at all Not difficult at all      Social History   Tobacco Use  Smoking Status Never  Smokeless Tobacco Never   BP Readings from Last 3 Encounters:  05/04/21 (!) 146/90   04/01/21 130/80  02/18/21 130/78   Pulse Readings from Last 3 Encounters:  05/04/21 68  04/01/21 73  02/18/21 83   Wt Readings from Last 3 Encounters:  05/04/21 135 lb (61.2 kg)  04/01/21 138 lb 6 oz (62.8 kg)  02/18/21 140 lb 8 oz (63.7 kg)   BMI Readings from Last 3 Encounters:  05/04/21 23.54 kg/m  04/01/21 24.13 kg/m  02/18/21 25.29 kg/m    Assessment/Interventions: Review of patient past medical history, allergies, medications, health status, including review of consultants reports, laboratory and other test data, was performed as part of comprehensive evaluation and provision of chronic care management services.   SDOH:  (Social Determinants of Health) assessments and interventions performed: Yes   SDOH Screenings   Alcohol Screen: Not on file  Depression (  PHQ2-9): Low Risk    PHQ-2 Score: 0  Financial Resource Strain: Low Risk    Difficulty of Paying Living Expenses: Not hard at all  Food Insecurity: Not on file  Housing: Not on file  Physical Activity: Not on file  Social Connections: Not on file  Stress: Not on file  Tobacco Use: Low Risk    Smoking Tobacco Use: Never   Smokeless Tobacco Use: Never   Passive Exposure: Not on file  Transportation Needs: Not on file    CCM Care Plan  Allergies  Allergen Reactions   Elmiron [Pentosan Polysulfate Sodium] Anxiety    "I felt like I wanted to kill myself"   Hctz [Hydrochlorothiazide] Other (See Comments)    Unknown reaction   Penicillins Rash   Sulfa Antibiotics Rash    Medications Reviewed Today     Reviewed by Sanda Klein, MD (Physician) on 05/04/21 at 1234  Med List Status: <None>   Medication Order Taking? Sig Documenting Provider Last Dose Status Informant  ALPRAZolam (XANAX) 0.5 MG tablet [id number] Yes TAKE 1 TABLET BY MOUTH EVERY DAY AS NEEDED Bedsole, Amy E, MD Taking Active   Ascorbic Acid (VITAMIN C) 1000 MG tablet [id number] Yes Take 1,000 mg by mouth daily. [provider]  Taking Active   benzonatate (TESSALON) 100 MG capsule [id number] Yes TAKE TWO CAPSULES BY MOUTH 3 TIMES A DAYAS NEEDED FOR COUGH Bedsole, Amy E, MD Taking Active   blood glucose meter kit and supplies KIT [id number] Yes Dispense based on patient and insurance preference. Use up to four times daily as directed. (FOR ICD-9 250.00, 250.01). Jinny Sanders, MD Taking Active   Cholecalciferol (VITAMIN D3) 1000 UNITS CAPS [id number] Yes Take 1 capsule by mouth daily. [provider] Taking Active   Cranberry 200 MG CAPS [id number] Yes Take by mouth. [provider] Taking Active   diphenhydrAMINE-PE-APAP 12.5-5-325 MG TABS [id number] Yes Take 0.5 tablets by mouth as needed. [provider] Taking Active   gabapentin (NEURONTIN) 100 MG tablet [id number] Yes Take 300 mg by mouth 3 (three) times daily. [provider] Taking Active   glucose blood (ONE TOUCH ULTRA TEST) test strip [id number] Yes -TEST TWICE DAILY Bedsole, Amy E, MD Taking Active   Guaifenesin 1200 MG TB12 [id number] Yes Take by mouth as needed. [provider] Taking Active   L-Theanine 100 MG CAPS [id number] Yes Take 2 tablets by mouth as needed. [provider] Taking Active   levothyroxine (SYNTHROID, LEVOTHROID) 75 MCG tablet [id number] Yes Take 75 mcg by mouth daily before breakfast. Brand Synthroid [provider] Taking Active Self  loratadine (CLARITIN) 10 MG tablet [id number] Yes Take 10 mg by mouth daily. [provider] Taking Active   MAGNESIUM GLYCINATE PLUS PO [id number] Yes Take 1 capsule by mouth daily. [provider] Taking Active   meloxicam (MOBIC) 7.5 MG tablet [id number] Yes Take 1 tablet (7.5 mg total) by mouth daily. Take with food to avoid upset stomach Cable, Alyson Locket, NP Taking Active   metoprolol succinate (TOPROL-XL) 50 MG 24 hr tablet [id number] Yes TAKE 1 TABLET BY MOUTH EVERY DAY Croitoru, Mihai, MD Taking Active   OMEGA-3 1000 MG CAPS [id number] Yes Take  1,000 mg by mouth daily. [provider] Taking Active   [facility] LANCETS 33L MISC [id number] Yes -TEST TWICE DAILY Jinny Sanders, MD Taking Active   OVER THE COUNTER MEDICATION [id number] Yes BLADDER EASE [provider] Taking Active   Pyridoxine HCl (VITAMIN B-6 PO)  [id number] Yes Take 1 tablet by mouth daily. [provider] Taking Active   rosuvastatin (CRESTOR) 10 MG tablet [id number] Yes TAKE 1 TABLET BY MOUTH EVERY DAY FOR CHOLESTEROL Bedsole, Amy E, MD Taking Active   Thiamine HCl (VITAMIN B-1) 250 MG tablet [id number] Yes Take 250 mg by mouth daily. [provider] Taking Active   tolterodine (DETROL LA) 4 MG 24 hr capsule [id number] Yes Take 1 capsule (4 mg total) by mouth daily. Jinny Sanders, MD Taking Active   vitamin B-12 (CYANOCOBALAMIN) 1000 MCG tablet [id number] Yes Take 1,000 mcg by mouth daily. [provider] Taking Active             Patient Active Problem List   Diagnosis Date Noted   Fall at home 02/18/2021   Right hip pain 02/18/2021   Pain in right femur 02/18/2021   Mid back pain on left side 09/03/2020   RLQ abdominal pain 09/03/2020   Diarrhea 09/03/2020   Bloating 09/03/2020   Osteoarthritis, hand 01/22/2016   Left medial knee pain 01/22/2016   Low back pain 01/22/2016   Counseling regarding end of life decision making 11/06/2014   De Quervain's tenosynovitis, left 03/13/2014   Anemia, iron deficiency 08/15/2013   Hearing loss 08/15/2013   Hypertension associated with diabetes ([place]) 12/10/2012   CAD (coronary artery disease) 12/10/2012   Low back pain radiating to both legs 04/06/2012   Tinnitus of both ears 01/31/2012   Syncope 07/14/2011   Carpal tunnel syndrome 03/17/2011   Hyperlipidemia associated with type 2 diabetes mellitus ([place]) 11/17/2010   Spasm larynx and swelling episodes 11/08/2010   Vocal cord dysfunction 11/08/2010   OTHER DISEASES OF VOCAL CORDS 11/08/2007   COUGH, chronic  07/02/2007    Hypothyroidism 06/05/2007   Type 2 diabetes mellitus with other circulatory complications HTN ([place]) 64/33/2951   OBSTRUCTIVE SLEEP APNEA 06/05/2007   HX of VENTRICULAR TACHYCARDIA 06/05/2007   ASTHMA 06/05/2007   GASTROESOPHAGEAL REFLUX DISEASE 06/05/2007   IRRITABLE BOWEL SYNDROME 06/05/2007   INTERSTITIAL CYSTITIS 06/05/2007   ALLERGY 06/05/2007    Immunization History  Administered Date(s) Administered   Fluad Quad(high Dose 65+) 05/07/2019, 03/27/2020, 04/01/2021   Influenza Split 05/15/2013   Influenza, High Dose Seasonal PF 04/19/2016, 05/09/2017, 04/25/2018   Influenza-Unspecified 05/01/2014, 06/03/2015, 04/19/2016   PFIZER(Purple Top)SARS-COV-2 Vaccination 07/30/2019, 08/20/2019, 05/08/2020   Pneumococcal Conjugate-13 08/15/2012   Pneumococcal Polysaccharide-23 11/06/2014   Tdap 06/03/2015   Zoster Recombinat (Shingrix) 03/31/2020, 07/01/2020   Zoster, Live 03/06/2013    Conditions to be addressed/monitored:  Hypertension, Hyperlipidemia, Diabetes, Coronary Artery Disease, GERD, and Hypothyroidism  There are no care plans that you recently modified to display for this patient.    Medication Assistance: None required.  Patient affirms current coverage meets needs.  Compliance/Adherence/Medication fill history: Care Gaps: Covid booster (due 08/08/20) Eye exam (due 09/02/20) A1c (due 09/17/20)  Star-Rating Drugs: Rosuvastatin - LF 02/09/21 x 90 ds  Patient's preferred pharmacy is:  [facility], [place] - [address] [place] [address] Phone: [phone] Fax: [phone]  [place], [place] - [address] [place] [address] Phone: [phone] Fax: [phone]  Uses pill box? Yes Pt endorses 100% compliance  We discussed: Current pharmacy is preferred with insurance plan and patient is satisfied with pharmacy services Patient decided to: Continue  current medication management strategy  Care Plan and Follow Up Patient Decision:  Patient agrees to Care  Plan and Follow-up.  Plan: Telephone follow up appointment with care management team member scheduled for:  3 months  Charlene Brooke, PharmD, [facility] Clinical Pharmacist [facility] [phone]   Current Barriers:  Unable to independently monitor therapeutic efficacy Unable to achieve control of frequent urination/incontinence   Pharmacist Clinical Goal(s):  Patient will achieve adherence to monitoring guidelines and medication adherence to achieve therapeutic efficacy achieve control of frequent urination as evidenced by patient report through collaboration with PharmD and provider.   Interventions: 1:1 collaboration with Jinny Sanders, MD regarding development and update of comprehensive plan of care as evidenced by provider attestation and co-signature Inter-disciplinary care team collaboration (see longitudinal plan of care) Comprehensive medication review performed; medication list updated in electronic medical record  Hypertension / Hx tachycardia (BP goal <130/80) -Controlled - BP is at goal in office and at home; pt reports occasionally BP is in 140s/80s; she reports hx of low BP when she was taking losartan too -hx of "passing out" at 4am; -Current home readings: 125/65 - 140/80 -Current treatment: Metoprolol succinate 50 mg daily HS -Medications previously tried: losartan  -Denies hypotensive/hypertensive symptoms -Educated on BP goals and benefits of medications for prevention of heart attack, stroke and kidney damage; Importance of home blood pressure monitoring; -Counseled to monitor BP at home periodically, document, and provide log at future appointments -Recommended to continue current medication  Hyperlipidemia / CAD (LDL goal < 70) -Controlled - LDL is at goal; pt endorses compliance with statin and denies issues -mild CAD on cardiac cath 2003,  normal perfusion study 2007 -Current treatment: Rosuvastatin 10 mg daily PM Omega-3 fish oil 1000 mg daily -Educated on Cholesterol goals; Benefits of statin for ASCVD risk reduction; -Recommended to continue current medication  Diabetes (A1c goal <7%) -Controlled - A1c is at goal; off metformin since ~Sept 2022 due to diarrhea which resolved upon stopping -Follows with Dr Chalmers Cater -Current medications: Testing supplies -Educated on A1c and blood sugar goals; Complications of diabetes including kidney damage, retinal damage, and cardiovascular disease; -Recommended to continue current medication  Gastroparesis / bloating (Goal: manage symptoms) -Not ideally controlled - pt has not started dicyclomine yet, she was concerned about how it works/side effects -pt is working on increasing fluid intake -Current treatment  Dicyclomine 10 mg TID prn (Dr Oren Beckmann) - not started Benefiber Dulcolax PRN -Counseled on dicyclomine benefits and possible side effects -Recommended to continue current medication  Hypothyroidism (Goal: maintain TSH in goal range) -Controlled - pt endorses compliance with Synthroid, takes on empty stomach first thing in AM -pt gets Synthroid brand discounted with mail pharmacy ($25 per month) -Current treatment  Synthroid 75 mcg daily -Recommended to continue current medication  Pain (Goal: manage pain) -Controlled - -Pt has been seeing chiropractor as well -Hx Carpal tunnel, osteoarthritis -Current treatment  Meloxicam 7.5 mg daily Tylenol 500 mg PRN -Advised to limit Tylenol to < 2000 mg/day given hx of elevated LFTs due to tylenol  Chronic cough (Goal: manage symptoms) -Controlled - pt reports symptoms are much better now than they used to be -Current treatment  Gabapentin 100 mg - 3 tab TID PRN (Dr Carol Ada) Benzonatate 100 mg TID PRN Alprazolam 0.5 mg PRN (#10 per year) -Recommended to continue current medication  Allergic rhinitis / sinus (Goal:  manage symptoms) -Controlled -Current treatment  Diphenhydramine-PE-APAP Mucinex Loratadine 10 mg Promethazine-DM Ventolin HFA -Recommended to continue current medication  Overactive bladder? (Goal: manage symptoms) -Uncontrolled -Pt endorses frequent urination, incomplete emptying, incontinence (with accidents); she  has only been on Elmiron for interstitial cystitis but could not tolerate behavioral side effects, she does not remember trying OAB medications before -Medications previously tried: Elmiron  -Discussed anticholinergic or beta-3 agonist medication may help with her symptoms -Preferred options per [facility] formulary:   Tier 1 - oxybutynin HCL 5 mg  Tier 2 - darifenacin, oxybutynin XL, tolterodine, trospium  Tier 3 - Myrbetriq  Tier 4 - solifenacin (nonpreferred) -Recommend trial of tolterodine ER 4 mg daily  Health Maintenance -Vaccine gaps: Covid booster -Current therapy:  Vitamin B12 1000 mcg Vitamin C 1000 mg Vitamin D 1000 IU L-theanine 100 mg - not taking Magnesium glycinate - not taking Vitamin B6 Thiamine 250 mg -Patient is satisfied with current therapy and denies issues -Recommended to continue current medication  Patient Goals/Self-Care Activities Patient will:  - take medications as prescribed focus on medication adherence by pill box check blood pressure 2-3x weekly, document, and provide at future appointments

## 2021-05-28 NOTE — Telephone Encounter (Signed)
  Chronic Care Management   Outreach Note  05/28/2021 Name: Valerie Vargas MRN: [id number] DOB: 1945-11-30  Referred by: Jinny Sanders, MD  Patient had a phone appointment scheduled with clinical pharmacist today.  An unsuccessful telephone outreach was attempted today. The patient was referred to the pharmacist for assistance with medications, care management and care coordination.   Patient will NOT be penalized in any way for missing a CCM appointment. The no-show fee does not apply.  If possible, a message was left to return call to: [phone] or to [facility].   Charlene Brooke, PharmD, BCACP Clinical Pharmacist [facility] [phone]

## 2021-05-31 ENCOUNTER — Encounter: Payer: Self-pay | Admitting: Family Medicine

## 2021-05-31 DIAGNOSIS — R140 Abdominal distension (gaseous): Secondary | ICD-10-CM | POA: Diagnosis not present

## 2021-05-31 DIAGNOSIS — D120 Benign neoplasm of cecum: Secondary | ICD-10-CM | POA: Diagnosis not present

## 2021-05-31 DIAGNOSIS — R197 Diarrhea, unspecified: Secondary | ICD-10-CM | POA: Diagnosis not present

## 2021-05-31 DIAGNOSIS — D122 Benign neoplasm of ascending colon: Secondary | ICD-10-CM | POA: Diagnosis not present

## 2021-05-31 DIAGNOSIS — K635 Polyp of colon: Secondary | ICD-10-CM | POA: Diagnosis not present

## 2021-05-31 LAB — HM COLONOSCOPY

## 2021-05-31 NOTE — Progress Notes (Addendum)
05/31/2021 - Called pt; her husband was going to get her Rosuvastatin for her today. She had a colonoscopy this morning and they forgot to get it while they were out. Patient assured me they would get it today.  Charlene Brooke, CPP notified  Marijean Niemann, [place] Clinical Pharmacy Assistant [phone]  Time Spent:  4 Minutes

## 2021-06-02 NOTE — Progress Notes (Addendum)
06/02/2021 - Patient picked up her Rosuvastatin from [facility] on 05/28/2021 90ds.   CPP notified  Marijean Niemann, [place] Pharmacy Assistant [phone]  Time Spent:  5 Minutes

## 2021-06-04 ENCOUNTER — Other Ambulatory Visit: Payer: Self-pay

## 2021-06-04 ENCOUNTER — Ambulatory Visit (INDEPENDENT_AMBULATORY_CARE_PROVIDER_SITE_OTHER): Payer: PPO | Admitting: Family Medicine

## 2021-06-04 ENCOUNTER — Encounter: Payer: Self-pay | Admitting: Family Medicine

## 2021-06-04 VITALS — BP 160/64 | HR 76 | Temp 96.6°F | Ht 63.5 in | Wt 137.4 lb

## 2021-06-04 DIAGNOSIS — R42 Dizziness and giddiness: Secondary | ICD-10-CM

## 2021-06-04 DIAGNOSIS — S0452XD Injury of facial nerve, left side, subsequent encounter: Secondary | ICD-10-CM

## 2021-06-04 DIAGNOSIS — I152 Hypertension secondary to endocrine disorders: Secondary | ICD-10-CM | POA: Diagnosis not present

## 2021-06-04 DIAGNOSIS — M25512 Pain in left shoulder: Secondary | ICD-10-CM | POA: Diagnosis not present

## 2021-06-04 DIAGNOSIS — R55 Syncope and collapse: Secondary | ICD-10-CM

## 2021-06-04 DIAGNOSIS — S0432XA Injury of trigeminal nerve, left side, initial encounter: Secondary | ICD-10-CM

## 2021-06-04 DIAGNOSIS — S0432XD Injury of trigeminal nerve, left side, subsequent encounter: Secondary | ICD-10-CM

## 2021-06-04 DIAGNOSIS — E1159 Type 2 diabetes mellitus with other circulatory complications: Secondary | ICD-10-CM

## 2021-06-04 DIAGNOSIS — S0452XA Injury of facial nerve, left side, initial encounter: Secondary | ICD-10-CM

## 2021-06-04 DIAGNOSIS — M25511 Pain in right shoulder: Secondary | ICD-10-CM | POA: Diagnosis not present

## 2021-06-04 DIAGNOSIS — W19XXXD Unspecified fall, subsequent encounter: Secondary | ICD-10-CM

## 2021-06-04 DIAGNOSIS — E236 Other disorders of pituitary gland: Secondary | ICD-10-CM | POA: Diagnosis not present

## 2021-06-04 NOTE — Progress Notes (Signed)
Patient ID: Valerie Vargas, female    DOB: 1946/07/08, 75 y.o.   MRN: [id number]  This visit was conducted in person.  BP (!) 160/64   Pulse 76   Temp (!) 96.6 F (35.9 C) (Temporal)   Ht 5' 3.5" (1.613 m)   Wt 137 lb 6 oz (62.3 kg)   SpO2 98%   BMI 23.95 kg/m    CC: Chief Complaint  Patient presents with   Fall    05/02/21-Seen in ER-Still has swelling in head/eyes    Dizziness    Subjective:   HPI: Valerie Vargas is a 75 y.o. female presenting on 06/04/2021 for Fall (05/02/21-Seen in ER-Still has swelling in head/eyes/) and Dizziness   ER visit 05/02/2021 following fall resulting in  7 cm laceration , left forehead  Fall occurred in shower after becoming dizzy, loss of consciousness.. hit knob on vanity.  CT Head and C-spine normal.  She has history of neurogenic syncope... Saw Dr. Canary Brim on 05/04/2021  Last time it happened was years ago.  Reviewed notes.  Reviewed ER note.  Empty sella noted incidentally:  plans to dicussed with [facility].   Has hypothyroid Normal TSH in 03/2021  on synthroid.   She still has swelling over left eye, face.  She occ still feeling lightheadedness.. no vertigo.  Having occ headache, but not progressive.  No Nausea.  Feeling tired Keeping up with fluids.   Now treated for rotator cuff with steroid injection.. followed by Ortho... this improving.       Relevant past medical, surgical, family and social history reviewed and updated as indicated. Interim medical history since our last visit reviewed. Allergies and medications reviewed and updated. Outpatient Medications Prior to Visit  Medication Sig Dispense Refill   ALPRAZolam (XANAX) 0.5 MG tablet TAKE 1 TABLET BY MOUTH EVERY DAY AS NEEDED 10 tablet 0   Ascorbic Acid (VITAMIN C) 1000 MG tablet Take 1,000 mg by mouth daily.     benzonatate (TESSALON) 100 MG capsule TAKE TWO CAPSULES BY MOUTH 3 TIMES A DAYAS NEEDED FOR COUGH 60 capsule 1   blood glucose meter kit and supplies  KIT Dispense based on patient and insurance preference. Use up to four times daily as directed. (FOR ICD-9 250.00, 250.01). 1 each 0   Cholecalciferol (VITAMIN D3) 1000 UNITS CAPS Take 1 capsule by mouth daily.     Cranberry 200 MG CAPS Take by mouth.     diphenhydrAMINE-PE-APAP 12.5-5-325 MG TABS Take 0.5 tablets by mouth as needed.     gabapentin (NEURONTIN) 100 MG tablet Take 300 mg by mouth 3 (three) times daily.     glucose blood (ONE TOUCH ULTRA TEST) test strip -TEST TWICE DAILY 100 each 6   Guaifenesin 1200 MG TB12 Take by mouth as needed.     L-Theanine 100 MG CAPS Take 2 tablets by mouth as needed.     levothyroxine (SYNTHROID, LEVOTHROID) 75 MCG tablet Take 75 mcg by mouth daily before breakfast. Brand Synthroid     loratadine (CLARITIN) 10 MG tablet Take 10 mg by mouth daily.     MAGNESIUM GLYCINATE PLUS PO Take 1 capsule by mouth daily.     meloxicam (MOBIC) 7.5 MG tablet Take 1 tablet (7.5 mg total) by mouth daily. Take with food to avoid upset stomach 10 tablet 0   metoprolol succinate (TOPROL-XL) 50 MG 24 hr tablet TAKE 1 TABLET BY MOUTH EVERY DAY 30 tablet 2   OMEGA-3 1000 MG CAPS Take 1,000  mg by mouth daily.     ONETOUCH DELICA LANCETS 91Y MISC -TEST TWICE DAILY 100 each 5   OVER THE COUNTER MEDICATION BLADDER EASE     Pyridoxine HCl (VITAMIN B-6 PO) Take 1 tablet by mouth daily.     rosuvastatin (CRESTOR) 10 MG tablet TAKE 1 TABLET BY MOUTH EVERY DAY FOR CHOLESTEROL 90 tablet 0   Thiamine HCl (VITAMIN B-1) 250 MG tablet Take 250 mg by mouth daily.     tolterodine (DETROL LA) 4 MG 24 hr capsule Take 1 capsule (4 mg total) by mouth daily. 30 capsule 11   vitamin B-12 (CYANOCOBALAMIN) 1000 MCG tablet Take 1,000 mcg by mouth daily.     gabapentin (NEURONTIN) 100 MG capsule Take 300 mg by mouth 3 (three) times daily.     No facility-administered medications prior to visit.     Per HPI unless specifically indicated in ROS section below Review of Systems  Constitutional:   Negative for fatigue and fever.  HENT:  Negative for ear pain.   Eyes:  Negative for pain.  Respiratory:  Negative for chest tightness and shortness of breath.   Cardiovascular:  Negative for chest pain, palpitations and leg swelling.  Gastrointestinal:  Negative for abdominal pain.  Genitourinary:  Negative for dysuria.  Objective:  BP (!) 160/64   Pulse 76   Temp (!) 96.6 F (35.9 C) (Temporal)   Ht 5' 3.5" (1.613 m)   Wt 137 lb 6 oz (62.3 kg)   SpO2 98%   BMI 23.95 kg/m   Wt Readings from Last 3 Encounters:  06/04/21 137 lb 6 oz (62.3 kg)  05/04/21 135 lb (61.2 kg)  04/01/21 138 lb 6 oz (62.8 kg)      Physical Exam Constitutional:      General: She is not in acute distress.    Appearance: Normal appearance. She is well-developed. She is not ill-appearing or toxic-appearing.  HENT:     Head: Normocephalic.     Right Ear: Hearing, tympanic membrane, ear canal and external ear normal. Tympanic membrane is not erythematous, retracted or bulging.     Left Ear: Hearing, tympanic membrane, ear canal and external ear normal. Tympanic membrane is not erythematous, retracted or bulging.     Nose: No mucosal edema or rhinorrhea.     Right Sinus: No maxillary sinus tenderness or frontal sinus tenderness.     Left Sinus: No maxillary sinus tenderness or frontal sinus tenderness.     Mouth/Throat:     Pharynx: Uvula midline.  Eyes:     General: Lids are normal. Lids are everted, no foreign bodies appreciated.     Conjunctiva/sclera: Conjunctivae normal.     Pupils: Pupils are equal, round, and reactive to light.  Neck:     Thyroid: No thyroid mass or thyromegaly.     Vascular: No carotid bruit.     Trachea: Trachea normal.  Cardiovascular:     Rate and Rhythm: Normal rate and regular rhythm.     Pulses: Normal pulses.     Heart sounds: Normal heart sounds, S1 normal and S2 normal. No murmur heard.   No friction rub. No gallop.  Pulmonary:     Effort: Pulmonary effort is normal.  No tachypnea or respiratory distress.     Breath sounds: Normal breath sounds. No decreased breath sounds, wheezing, rhonchi or rales.  Abdominal:     General: Bowel sounds are normal.     Palpations: Abdomen is soft.     Tenderness: There  is no abdominal tenderness.  Musculoskeletal:     Cervical back: Normal range of motion and neck supple.  Skin:    General: Skin is warm and dry.     Findings: No rash.     Comments: Well healed scar.  Neurological:     Mental Status: She is alert.     Comments:   Left eyelid droop, unable to wrinkle forehead, able to close eye fully.    Psychiatric:        Mood and Affect: Mood is not anxious or depressed.        Speech: Speech normal.        Behavior: Behavior normal. Behavior is cooperative.        Thought Content: Thought content normal.        Judgment: Judgment normal.      Results for orders placed or performed in visit on 05/31/21  HM COLONOSCOPY  Result Value Ref Range   HM Colonoscopy See Report (in chart) See Report (in chart), Patient Reported    This visit occurred during the SARS-CoV-2 public health emergency.  Safety protocols were in place, including screening questions prior to the visit, additional usage of staff PPE, and extensive cleaning of exam room while observing appropriate contact time as indicated for disinfecting solutions.   COVID 19 screen:  No recent travel or known exposure to COVID19 The patient denies respiratory symptoms of COVID 19 at this time. The importance of social distancing was discussed today.   Assessment and Plan   Problem List Items Addressed This Visit     Dizziness     Possibly ore recent dizziness a symptoms of concussion, seems to be improving. No new progressive neurochanges suggesting intracranial bleed.  Brain rest and physical rest.  Return precautions given.      Empty sella ([place])    Noted incidentally on head CT.  Pt treated for hypothyroid.  Will request recommendations from  [facility], Dr. Chalmers Cater... MRI brain, labs etc      Fall - Primary   Hypertension associated with diabetes ([place])     At goal per cardiology recommendation per pt. Reviewed OV from cardiology.      Injury of left facial nerve   Injury of left trigeminal nerve     Given time for improvement.  Keep appt with opthamology in 07/2021 as upper lid obstructing vision.. may need lift.      Neurogenic syncope    Chronic issue for her.  Eval in past unremarkable.  Recent syncopal event likely multifactorial related.. heat of shower, nyquil use etc.          Eliezer Lofts, MD

## 2021-06-04 NOTE — Patient Instructions (Addendum)
Let Dr. Chalmers Cater know about the " empty sella" found on head CT through their computer portal. I will let you know if there is anything we need to do in the meantime.   Physical and brain rest as likely recovering concussion/head trauma.  Call if

## 2021-06-04 NOTE — Assessment & Plan Note (Signed)
Given time for improvement.  Keep appt with opthamology in 07/2021 as upper lid obstructing vision.. may need lift.

## 2021-06-04 NOTE — Assessment & Plan Note (Signed)
Possibly ore recent dizziness a symptoms of concussion, seems to be improving. No new progressive neurochanges suggesting intracranial bleed.  Brain rest and physical rest.  Return precautions given.

## 2021-06-04 NOTE — Assessment & Plan Note (Signed)
Chronic issue for her.  Eval in past unremarkable.  Recent syncopal event likely multifactorial related.. heat of shower, nyquil use etc.

## 2021-06-04 NOTE — Assessment & Plan Note (Signed)
At goal per cardiology recommendation per pt. Reviewed OV from cardiology.

## 2021-06-04 NOTE — Assessment & Plan Note (Signed)
Noted incidentally on head CT.  Pt treated for hypothyroid.  Will request recommendations from ENDO, Dr. Chalmers Cater... MRI brain, labs etc

## 2021-06-07 NOTE — Progress Notes (Signed)
Office note and Head CT results faxed to Dr. Suzette Battiest at [phone] as instructed by Dr. Diona Browner.

## 2021-06-24 DIAGNOSIS — E039 Hypothyroidism, unspecified: Secondary | ICD-10-CM | POA: Diagnosis not present

## 2021-06-24 DIAGNOSIS — I951 Orthostatic hypotension: Secondary | ICD-10-CM | POA: Diagnosis not present

## 2021-06-24 DIAGNOSIS — E1165 Type 2 diabetes mellitus with hyperglycemia: Secondary | ICD-10-CM | POA: Diagnosis not present

## 2021-06-24 DIAGNOSIS — I10 Essential (primary) hypertension: Secondary | ICD-10-CM | POA: Diagnosis not present

## 2021-06-24 DIAGNOSIS — R7989 Other specified abnormal findings of blood chemistry: Secondary | ICD-10-CM | POA: Diagnosis not present

## 2021-06-24 DIAGNOSIS — E236 Other disorders of pituitary gland: Secondary | ICD-10-CM | POA: Diagnosis not present

## 2021-06-24 DIAGNOSIS — E7800 Pure hypercholesterolemia, unspecified: Secondary | ICD-10-CM | POA: Diagnosis not present

## 2021-06-30 ENCOUNTER — Other Ambulatory Visit: Payer: Self-pay

## 2021-06-30 ENCOUNTER — Encounter: Payer: Self-pay | Admitting: Cardiovascular Disease

## 2021-06-30 ENCOUNTER — Ambulatory Visit: Payer: PPO | Admitting: Cardiovascular Disease

## 2021-06-30 VITALS — BP 132/70 | HR 69 | Ht 63.0 in | Wt 139.8 lb

## 2021-06-30 DIAGNOSIS — R55 Syncope and collapse: Secondary | ICD-10-CM | POA: Diagnosis not present

## 2021-06-30 DIAGNOSIS — I152 Hypertension secondary to endocrine disorders: Secondary | ICD-10-CM | POA: Diagnosis not present

## 2021-06-30 DIAGNOSIS — E782 Mixed hyperlipidemia: Secondary | ICD-10-CM

## 2021-06-30 DIAGNOSIS — R030 Elevated blood-pressure reading, without diagnosis of hypertension: Secondary | ICD-10-CM | POA: Diagnosis not present

## 2021-06-30 DIAGNOSIS — G4733 Obstructive sleep apnea (adult) (pediatric): Secondary | ICD-10-CM

## 2021-06-30 DIAGNOSIS — E1159 Type 2 diabetes mellitus with other circulatory complications: Secondary | ICD-10-CM

## 2021-06-30 NOTE — Progress Notes (Signed)
Cardiology Office Note    Date:  07/07/2021   ID:  Valerie Vargas, Valerie Vargas [birth year], MRN [id number]  PCP:  Jinny Sanders, MD  Cardiologist:  Shelva Majestic, MD (sleep); Dr. Sallyanne Kuster  New sleep evaluation   History of Present Illness:  Valerie Vargas is a 75 y.o. female who is followed by Dr. Sallyanne Kuster for cardiology care.  She has a history of hypertension, hyperlipidemia, type 2 diabetes mellitus, and mild nonobstructive CAD.  She has a history of obstructive sleep most recently diagnosed in 2019.  However she states that she had an original diagnosis in 2005 and had used CPAP previously.  On her diagnostic polysomnogram on May 01, 2018 AHI was 6.2/h, RDI 7.7/h and REM sleep AHI 16.2/h.  She had significant oxygen desaturation to a nadir of 78%.  She underwent CPAP titration on July 01, 2018 was titrated up to 14 cm water pressure with AHI of 0 and O2 nadir at 94%.  She apparently started CPAP in January 2020.  I never saw her in follow-up of her sleep studies or CPAP initiation due to start of the  [place] pandemic.  Apparently, she has been using her husband's old CPAP unit from the [facility] hospital since he had received a more recent CPAP machine.  He brought the machine that she had been using with her to the office today which is a ResMed S9 AutoSet unit set at a 14 cm set pressure.  She had recently had a syncopal spell when getting out of her shower.  This resulted in significant laceration of her forehead and scalp there was no skull fracture and an incidental note was made of empty sella.  As result of her trauma, she had not used CPAP over the last several months.  She recently saw Dr. Sallyanne Kuster who felt that she would benefit from a sleep evaluation once she is able to wear CPAP again after adequate healing of her forehead laceration.  We were able to obtain a download from her husband's old CPAP machine.  Over the last 6 months, compliance was 60 out of 180 days with average use at  only 3 hours and 32 minutes per night.  She has not used any CPAP since her laceration on May 02, 2021.  She states she does have a history of sleepwalking.  And unconsciously she has taken off her CPAP mask in the past when she was sleeping and was unaware of taking it off.  She has a Fisher-Paykel Simplus full facemask.  She typically goes to bed at 10 PM and wakes up between 5 and 7 AM.  She admits to snoring.  An Epworth Sleepiness Scale score was calculated in the office today and this endorsed at 12 consistent with excessive daytime sleepiness.  She presents for her initial sleep evaluation.  Past Medical History:  Diagnosis Date   Allergy, unspecified not elsewhere classified    Chronic interstitial cystitis    Cough    Esophageal reflux    H/O syncope 2007   No recurrence;Tilt table 09/23/05-negative   Hyperlipidemia 08/22/2014   Hypertension    Hypertriglyceridemia    Irritable bowel syndrome    Nonocclusive coronary atherosclerosis of native coronary artery    Obstructive sleep apnea (adult) (pediatric)    Paroxysmal ventricular tachycardia    Type II or unspecified type diabetes mellitus without mention of complication, not stated as uncontrolled    Unspecified asthma(493.90)    Unspecified hypothyroidism    Ventricular tachycardia  Past Surgical History:  Procedure Laterality Date   ABDOMINAL HYSTERECTOMY  11/1979   for endometriosis   CHOLECYSTECTOMY  07/2000   ESOPHAGOGASTRODUODENOSCOPY ENDOSCOPY  2010    Current Medications: Outpatient Medications Prior to Visit  Medication Sig Dispense Refill   ALPRAZolam (XANAX) 0.5 MG tablet TAKE 1 TABLET BY MOUTH EVERY DAY AS NEEDED 10 tablet 0   Ascorbic Acid (VITAMIN C) 1000 MG tablet Take 1,000 mg by mouth daily.     benzonatate (TESSALON) 100 MG capsule TAKE TWO CAPSULES BY MOUTH 3 TIMES A DAYAS NEEDED FOR COUGH 60 capsule 1   blood glucose meter kit and supplies KIT Dispense based on patient and insurance preference. Use  up to four times daily as directed. (FOR ICD-9 250.00, 250.01). 1 each 0   Cholecalciferol (VITAMIN D3) 1000 UNITS CAPS Take 1 capsule by mouth daily.     Cranberry 200 MG CAPS Take by mouth.     diphenhydrAMINE-PE-APAP 12.5-5-325 MG TABS Take 0.5 tablets by mouth as needed.     gabapentin (NEURONTIN) 100 MG tablet Take 300 mg by mouth 3 (three) times daily.     glucose blood (ONE TOUCH ULTRA TEST) test strip -TEST TWICE DAILY 100 each 6   Guaifenesin 1200 MG TB12 Take by mouth as needed.     L-Theanine 100 MG CAPS Take 2 tablets by mouth as needed.     levothyroxine (SYNTHROID, LEVOTHROID) 75 MCG tablet Take 75 mcg by mouth daily before breakfast. Brand Synthroid     loratadine (CLARITIN) 10 MG tablet Take 10 mg by mouth daily.     MAGNESIUM GLYCINATE PLUS PO Take 1 capsule by mouth daily.     metoprolol succinate (TOPROL-XL) 50 MG 24 hr tablet TAKE 1 TABLET BY MOUTH EVERY DAY 30 tablet 2   OMEGA-3 1000 MG CAPS Take 1,000 mg by mouth daily.     ONETOUCH DELICA LANCETS 91B MISC -TEST TWICE DAILY 100 each 5   OVER THE COUNTER MEDICATION BLADDER EASE     Pyridoxine HCl (VITAMIN B-6 PO) Take 1 tablet by mouth daily.     rosuvastatin (CRESTOR) 10 MG tablet TAKE 1 TABLET BY MOUTH EVERY DAY FOR CHOLESTEROL 90 tablet 0   Thiamine HCl (VITAMIN B-1) 250 MG tablet Take 250 mg by mouth daily.     tolterodine (DETROL LA) 4 MG 24 hr capsule Take 1 capsule (4 mg total) by mouth daily. 30 capsule 11   vitamin B-12 (CYANOCOBALAMIN) 1000 MCG tablet Take 1,000 mcg by mouth daily.     meloxicam (MOBIC) 7.5 MG tablet Take 1 tablet (7.5 mg total) by mouth daily. Take with food to avoid upset stomach (Patient not taking: Reported on 06/30/2021) 10 tablet 0   No facility-administered medications prior to visit.     Allergies:   Elmiron [pentosan polysulfate sodium], Hctz [hydrochlorothiazide], Penicillins, and Sulfa antibiotics   Social History   Socioeconomic History   Marital status: Married    Spouse  name: Not on file   Number of children: Not on file   Years of education: Not on file   Highest education level: Not on file  Occupational History   Occupation: retired  Tobacco Use   Smoking status: Never   Smokeless tobacco: Never  Vaping Use   Vaping Use: Never used  Substance and Sexual Activity   Alcohol use: No   Drug use: No   Sexual activity: Yes  Other Topics Concern   Not on file  Social History Narrative   Not  on file   Social Determinants of Health   Financial Resource Strain: Low Risk    Difficulty of Paying Living Expenses: Not hard at all  Food Insecurity: Not on file  Transportation Needs: Not on file  Physical Activity: Not on file  Stress: Not on file  Social Connections: Not on file     Family History:  The patient's family history includes Arthritis in her mother; Breast cancer in her maternal aunt, mother, and paternal aunt; Diabetes in her father and mother; Heart disease in her father; Leukemia in her brother; Stomach cancer in her father.   ROS General: Negative; No fevers, chills, or night sweats;  HEENT: Negative; No changes in vision or hearing, sinus congestion, difficulty swallowing Pulmonary: Negative; No cough, wheezing, shortness of breath, hemoptysis Cardiovascular: Negative; No chest pain, presyncope, syncope, palpitations GI: Negative; No nausea, vomiting, diarrhea, or abdominal pain GU: Negative; No dysuria, hematuria, or difficulty voiding Musculoskeletal: Negative; no myalgias, joint pain, or weakness Hematologic/Oncology: Negative; no easy bruising, bleeding Endocrine: Negative; no heat/cold intolerance; no diabetes Neuro: Negative; no changes in balance, headaches Skin: Negative; No rashes or skin lesions Psychiatric: Negative; No behavioral problems, depression Sleep: Positive for OSA, reportedly initially diagnosed in 2005, reevaluated in 2019.  Poor compliance with CPAP.  Positive for negative snoring, daytime sleepiness,  hypersomnolence; No bruxism, restless legs, hypnogognic hallucinations, no cataplexy   Epworth Sleepiness Scale: Situation   Chance of Dozing/Sleeping (0 = never , 1 = slight chance , 2 = moderate chance , 3 = high chance )   sitting and reading 1   watching TV 3   sitting inactive in a public place 0   being a passenger in a motor vehicle for an hour or more 3   lying down in the afternoon 3   sitting and talking to someone 0   sitting quietly after lunch (no alcohol) 2   while stopped for a few minutes in traffic as the driver 0   Total Score  12    Other comprehensive 14 point system review is negative.   PHYSICAL EXAM:   VS:  BP 132/70    Pulse 69    Ht '5\' 3"'  (1.6 m)    Wt 139 lb 12.8 oz (63.4 kg)    SpO2 98%    BMI 24.76 kg/m     Repeat blood pressure by me was 150/76  Wt Readings from Last 3 Encounters:  06/30/21 139 lb 12.8 oz (63.4 kg)  06/04/21 137 lb 6 oz (62.3 kg)  05/04/21 135 lb (61.2 kg)    General: Alert, oriented, no distress.  Skin: normal turgor, no rashes, warm and dry HEENT: Normocephalic, atraumatic. Pupils equal round and reactive to light; sclera anicteric; extraocular muscles intact;  Nose without nasal septal hypertrophy Mouth/Parynx benign; Mallinpatti scale 4 Neck: No JVD, no carotid bruits; normal carotid upstroke Lungs: clear to ausculatation and percussion; no wheezing or rales Chest wall: without tenderness to palpitation Heart: PMI not displaced, RRR, s1 s2 normal, 1/6 systolic murmur, no diastolic murmur, no rubs, gallops, thrills, or heaves Abdomen: soft, nontender; no hepatosplenomehaly, BS+; abdominal aorta nontender and not dilated by palpation. Back: no CVA tenderness Pulses 2+ Musculoskeletal: full range of motion, normal strength, no joint deformities Extremities: no clubbing cyanosis or edema, Homan's sign negative  Neurologic: grossly nonfocal; Cranial nerves grossly wnl Psychologic: Normal mood and affect   Studies/Labs  Reviewed:   June 30, 2021 ECG (independently read by me):  NSR at 69, no  ectopy, normal intervals  Recent Labs: BMP Latest Ref Rng & Units 03/24/2021 09/03/2020 03/19/2020  Glucose 70 - 99 mg/dL 110(H) 103(H) -  BUN 6 - 23 mg/dL '12 9 13  ' Creatinine 0.40 - 1.20 mg/dL 0.80 0.82 0.9  Sodium 135 - 145 mEq/L 140 138 141  Potassium 3.5 - 5.1 mEq/L 4.7 4.8 4.8  Chloride 96 - 112 mEq/L 103 104 103  CO2 19 - 32 mEq/L 25 28 23(A)  Calcium 8.4 - 10.5 mg/dL 10.0 9.7 9.7     Hepatic Function Latest Ref Rng & Units 03/24/2021 09/03/2020 03/19/2020  Total Protein 6.0 - 8.3 g/dL 7.1 7.4 -  Albumin 3.5 - 5.2 g/dL 4.2 4.6 -  AST 0 - 37 U/L 16 32 33  ALT 0 - 35 U/L '10 24 27  ' Alk Phosphatase 39 - 117 U/L 48 43 56  Total Bilirubin 0.2 - 1.2 mg/dL 0.4 0.5 -    CBC Latest Ref Rng & Units 03/24/2021 09/03/2020 02/09/2018  WBC 4.0 - 10.5 K/uL 9.4 7.2 7.4  Hemoglobin 12.0 - 15.0 g/dL 12.2 12.7 13.1  Hematocrit 36.0 - 46.0 % 37.3 37.4 38.7  Platelets 150.0 - 400.0 K/uL 292.0 206.0 215.0   Lab Results  Component Value Date   MCV 89.2 03/24/2021   MCV 87.8 09/03/2020   MCV 88.8 02/09/2018   Lab Results  Component Value Date   TSH 2.31 03/24/2021   Lab Results  Component Value Date   HGBA1C 6.6 (H) 03/24/2021     BNP No results found for: BNP  ProBNP No results found for: PROBNP   Lipid Panel     Component Value Date/Time   CHOL 139 03/24/2021 0948   TRIG 198.0 (H) 03/24/2021 0948   HDL 48.40 03/24/2021 0948   CHOLHDL 3 03/24/2021 0948   VLDL 39.6 03/24/2021 0948   LDLCALC 51 03/24/2021 0948   LDLDIRECT 70.0 02/09/2018 1033     RADIOLOGY: No results found.   Additional studies/ records that were reviewed today include:   Patient Name: Valerie Vargas, Valerie Vargas Date: 07/01/2018 Gender: Female D.O.B: 23-Dec-1945 Age (years): 65 Referring Provider: Dani Gobble Croitoru Height (inches): 63 Interpreting Physician: Shelva Majestic MD, ABSM Weight (lbs): 132 RPSGT: Jonna Coup BMI: 23 MRN:  [id number] Neck Size: 14.00   CLINICAL INFORMATION The patient is referred for a CPAP titration to treat sleep apnea.   Date of NPSG: 05/01/2018: AHI 6.2/h; RDI 7.7/h; REM sleep AHI 16.2/h; supine 9.9/h; O2 desaturatin to 78%.   SLEEP STUDY TECHNIQUE As per the AASM Manual for the Scoring of Sleep and Associated Events v2.3 (April 2016) with a hypopnea requiring 4% desaturations.   The channels recorded and monitored were frontal, central and occipital EEG, electrooculogram (EOG), submentalis EMG (chin), nasal and oral airflow, thoracic and abdominal wall motion, anterior tibialis EMG, snore microphone, electrocardiogram, and pulse oximetry. Continuous positive airway pressure (CPAP) was initiated at the beginning of the study and titrated to treat sleep-disordered breathing.   MEDICATIONS   ALPRAZolam (XANAX) 0.5 MG tablet      Ascorbic Acid (VITAMIN C) 1000 MG tablet      benzonatate (TESSALON) 100 MG capsule      Cholecalciferol (VITAMIN D3) 1000 UNITS CAPS      Diphenhydramine-PE-APAP (BENADRYL ALLERGY/SINUS HEADACH) 12.5-5-325 MG TABS     gabapentin (NEURONTIN) 100 MG tablet      glucose blood (ONE TOUCH ULTRA TEST) test strip      Guaifenesin 1200 MG TB12      L-Theanine 100 MG  CAPS      levothyroxine (SYNTHROID, LEVOTHROID) 75 MCG tablet     loratadine (CLARITIN) 10 MG tablet      MAGNESIUM GLYCINATE PLUS PO      meloxicam (MOBIC) 15 MG tablet      metFORMIN (GLUCOPHAGE-XR) 500 MG 24 hr tablet     metoprolol succinate (TOPROL-XL) 50 MG 24 hr tablet     metoprolol succinate (TOPROL-XL) 50 MG 24 hr tablet      OMEGA-3 1000 MG CAPS      ONETOUCH DELICA LANCETS 47M MISC      promethazine-dextromethorphan (PROMETHAZINE-DM) 6.25-15 MG/5ML syrup      Pyridoxine HCl (VITAMIN B-6 PO)     rosuvastatin (CRESTOR) 10 MG tablet      scopolamine (TRANSDERM-SCOP, 1.5 MG,) 1 MG/3DAYS      Thiamine HCl (VITAMIN B-1) 250 MG tablet     VENTOLIN HFA 108 (90 Base) MCG/ACT inhaler      vitamin  B-12 (CYANOCOBALAMIN) 1000 MCG tablet        Medications self-administered by patient taken the night of the study : N/A   TECHNICIAN COMMENTS Comments added by technician: NONE Comments added by scorer: N/A   RESPIRATORY PARAMETERS Optimal PAP Pressure (cm):  14        AHI at Optimal Pressure (/hr):            0.0 Overall Minimal O2 (%):         81.0     Supine % at Optimal Pressure (%):    16 Minimal O2 at Optimal Pressure (%): 94.0        SLEEP ARCHITECTURE The study was initiated at 10:39:27 PM and ended at 4:57:22 AM.   Sleep onset time was 2.8 minutes and the sleep efficiency was 90.6%%. The total sleep time was 342.5 minutes.   The patient spent 3.2%% of the night in stage N1 sleep, 62.9%% in stage N2 sleep, 9.5%% in stage N3 and 24.4% in REM.Stage REM latency was 85.5 minutes   Wake after sleep onset was 32.6. Alpha intrusion was absent. Supine sleep was 35.33%.   CARDIAC DATA The 2 lead EKG demonstrated sinus rhythm. The mean heart rate was 59.8 beats per minute. Other EKG findings include: None.   LEG MOVEMENT DATA The total Periodic Limb Movements of Sleep (PLMS) were 0. The PLMS index was 0.0. A PLMS index of <15 is considered normal in adults.   IMPRESSIONS - CPAP was initiated at 5 cm and was titrated to optimal CPAP pressure at 14 cm of water. At 14 cm AHI was 0 and O2 nadir was 94%. - Central sleep apnea was not noted during this titration (CAI = 2.6/h). - Moderate oxygen desaturations to a nadir of 81% at 11 cm of water. - The patient snored with soft snoring volume during this titration study. - No cardiac abnormalities were observed during this study. - Clinically significant periodic limb movements were not noted during this study. Arousals associated with PLMs were rare.   DIAGNOSIS - Obstructive Sleep Apnea (327.23 [G47.33 ICD-10])   RECOMMENDATIONS - Recommend an initial trial of CPAP therapy with EPR at 14 cm H2O with heated humidification. A Small  size Fisher&Paykel Full Face Mask Simplus mask was used for the titration. - Effort should be made to optimize nasal and oropharyngeal patency. - Avoid alcohol, sedatives and other CNS depressants that may worsen sleep apnea and disrupt normal sleep architecture. - Sleep hygiene should be reviewed to assess factors that may improve  sleep quality. - Weight management and regular exercise should be initiated or continued. - Recommend a download be obtained in one month and turn sleep clinic evaluation after 4 weeks of therapy    ASSESSMENT:    1. OSA (obstructive sleep apnea)   2. Elevated blood pressure reading   3. Neurocardiogenic syncope   4. Hypertension associated with diabetes ([place])   5. Type 2 diabetes mellitus with other circulatory complications HTN ([place])   6. Mixed hyperlipidemia     PLAN:  Valerie Vargas is a pleasant 75 year old female who has a history of remote syncope in 2007 negative work-up, mild CAD by angiography in 2003, hypertension, hyperlipidemia, and type 2 diabetes mellitus.  She was originally diagnosed with sleep apnea in 2005 and had transiently used CPAP.  She underwent reevaluation in 2019 which confirmed mild overall sleep apnea but with moderate sleep apnea during REM sleep and significant oxygen desaturation to a nadir of 78%.  Currently she had never been evaluated by me following her initial sleep studies since the [place] pandemic began.  She apparently was never set up with a DME company because she wanted to initiate therapy using her husband's old ResMed S9 auto CPAP unit which she had received from the [place] since he had received a recent new CPAP unit.  Apparently, over the past 6 months, compliance was only 33% usage but following her syncopal spell resulting in significant laceration to her for head she is unable to use CPAP at all since her October 2022 syncopal event.  Presently she admits to intermittent snoring, and has nocturia 2-4 times per night.   She does not sleep well on her back.  I spent over 40 minutes with both she and her husband in the office today discussing sleep apnea.  I discussed its effects on sleep architecture and spent considerable time discussing adverse potential cardiovascular consequences including increased sympathetic tone resulting from frequent arousals contributing to nocturnal palpitations, increased risk for atrial fibrillation, occultly with optimal blood pressure control, effects on inflammation, glucose, GERD, as well as nocturnal hypoxemia potentially contributing to both cardiac as well as cerebrovascular ischemia.  I discussed at length the pathophysiology associated with frequent nocturia.  She and her husband were very appreciative of the discussion and her husband who has been on CPAP for many years had never been told any of this information at the [place].  I discussed with her optimal sleep duration for an adult to 7 to 9 hours.  She may well have insomnia is contributing to her sleepwalking.  On her most recent download, when used, AHI was 3.4 at her 14 cm set pressure.  She has had difficulty with her mask and I have provided her a new sample of the ResMed F 30i mask which I believe she will tolerate well with the tubing now arising from the crown of her head and the fullface mask being under the nose rather than on the bridge of her nose causing irritation.  I believe this will be a significant improvement from  her current mask.  We discussed the new ResMed air sense 11 CPAP unit but at present she is content using her husband's old CPAP unit from the [place] which came at no cost.  Her blood pressure today when taken by me was elevated.  Hopefully resumption of CPAP therapy her blood pressure will improve.  She continues to be on metoprolol succinate 50 mg daily.  She is on levothyroxine 75 mcg for hypothyroidism  and is on rosuvastatin 10 mg for hyperlipidemia.  I will see her in 4 to 6 months for follow-up evaluation or  sooner as needed.   Time spent: Greater than 40 minutes  Medication Adjustments/Labs and Tests Ordered: Current medicines are reviewed at length with the patient today.  Concerns regarding medicines are outlined above.  Medication changes, Labs and Tests ordered today are listed in the Patient Instructions below. Patient Instructions  Medication Instructions:  The current medical regimen is effective;  continue present plan and medications.  *If you need a refill on your cardiac medications before your next appointment, please call your pharmacy*   Follow-Up: At [facility], you and your health needs are our priority.  As part of our continuing mission to provide you with exceptional heart care, we have created designated Provider Care Teams.  These Care Teams include your primary Cardiologist (physician) and Advanced Practice Providers (APPs -  Physician Assistants and Nurse Practitioners) who all work together to provide you with the care you need, when you need it.  We recommend signing up for the patient portal called "MyChart".  Sign up information is provided on this After Visit Summary.  MyChart is used to connect with patients for Virtual Visits (Telemedicine).  Patients are able to view lab/test results, encounter notes, upcoming appointments, etc.  Non-urgent messages can be sent to your provider as well.   To learn more about what you can do with MyChart, go to [email or web address].    Your next appointment:   4 month(s)  The format for your next appointment:   In Person  Provider:   Shelva Majestic, MD (sleep)      Signed, Shelva Majestic, MD  07/07/2021 4:06 PM    [facility] [address] [place], [place], [place]  [address] Phone: [phone]

## 2021-06-30 NOTE — Patient Instructions (Signed)
Medication Instructions:  The current medical regimen is effective;  continue present plan and medications.  *If you need a refill on your cardiac medications before your next appointment, please call your pharmacy*   Follow-Up: At [facility], you and your health needs are our priority.  As part of our continuing mission to provide you with exceptional heart care, we have created designated Provider Care Teams.  These Care Teams include your primary Cardiologist (physician) and Advanced Practice Providers (APPs -  Physician Assistants and Nurse Practitioners) who all work together to provide you with the care you need, when you need it.  We recommend signing up for the patient portal called "MyChart".  Sign up information is provided on this After Visit Summary.  MyChart is used to connect with patients for Virtual Visits (Telemedicine).  Patients are able to view lab/test results, encounter notes, upcoming appointments, etc.  Non-urgent messages can be sent to your provider as well.   To learn more about what you can do with MyChart, go to [email or web address].    Your next appointment:   4 month(s)  The format for your next appointment:   In Person  Provider:   Shelva Majestic, MD (sleep)

## 2021-07-07 ENCOUNTER — Encounter: Payer: Self-pay | Admitting: Cardiovascular Disease

## 2021-07-09 ENCOUNTER — Other Ambulatory Visit: Payer: Self-pay | Admitting: Cardiovascular Disease

## 2021-07-23 DIAGNOSIS — E119 Type 2 diabetes mellitus without complications: Secondary | ICD-10-CM | POA: Diagnosis not present

## 2021-07-23 DIAGNOSIS — H57812 Brow ptosis, left: Secondary | ICD-10-CM | POA: Diagnosis not present

## 2021-08-12 DIAGNOSIS — E236 Other disorders of pituitary gland: Secondary | ICD-10-CM | POA: Diagnosis not present

## 2021-08-24 ENCOUNTER — Other Ambulatory Visit: Payer: Self-pay | Admitting: Family Medicine

## 2021-09-06 ENCOUNTER — Encounter: Payer: Self-pay | Admitting: Family Medicine

## 2021-09-07 ENCOUNTER — Other Ambulatory Visit: Payer: Self-pay | Admitting: Family Medicine

## 2021-09-07 DIAGNOSIS — S0432XD Injury of trigeminal nerve, left side, subsequent encounter: Secondary | ICD-10-CM

## 2021-09-07 DIAGNOSIS — W19XXXD Unspecified fall, subsequent encounter: Secondary | ICD-10-CM

## 2021-09-07 DIAGNOSIS — S0452XD Injury of facial nerve, left side, subsequent encounter: Secondary | ICD-10-CM

## 2021-09-22 ENCOUNTER — Telehealth: Payer: Self-pay

## 2021-09-22 NOTE — Progress Notes (Signed)
? ? ?  Chronic Care Management ?Pharmacy Assistant  ? ?Name: Valerie Vargas  MRN: [id number] DOB: 08-Jan-1946 ?  ?Transition CCM to Self Care ? ?Patient contacted to inform they have achieved their CCM goals and no longer need to be contacted as frequently. Patient advised services will still be available to them if they would like to reach out or have any new health concerns. Verified patient had contact information to pharmacist and health concierge on hand. Patient made aware CCM services would be continued if desired. Patient consented to cancel future CCM appointments. ? ?Charlene Brooke, CPP notified ? ?Marijean Niemann, RMA ?Clinical Pharmacy Assistant ?[phone] ?

## 2021-09-27 DIAGNOSIS — Z20828 Contact with and (suspected) exposure to other viral communicable diseases: Secondary | ICD-10-CM | POA: Diagnosis not present

## 2021-09-27 DIAGNOSIS — R051 Acute cough: Secondary | ICD-10-CM | POA: Diagnosis not present

## 2021-09-27 DIAGNOSIS — J209 Acute bronchitis, unspecified: Secondary | ICD-10-CM | POA: Diagnosis not present

## 2021-09-27 DIAGNOSIS — R509 Fever, unspecified: Secondary | ICD-10-CM | POA: Diagnosis not present

## 2021-09-30 ENCOUNTER — Encounter: Payer: Self-pay | Admitting: Family Medicine

## 2021-09-30 ENCOUNTER — Other Ambulatory Visit: Payer: Self-pay

## 2021-09-30 ENCOUNTER — Ambulatory Visit (INDEPENDENT_AMBULATORY_CARE_PROVIDER_SITE_OTHER): Payer: PPO | Admitting: Family Medicine

## 2021-09-30 VITALS — BP 132/80 | HR 88 | Temp 98.4°F | Ht 63.0 in | Wt 146.2 lb

## 2021-09-30 DIAGNOSIS — J209 Acute bronchitis, unspecified: Secondary | ICD-10-CM | POA: Diagnosis not present

## 2021-09-30 DIAGNOSIS — K219 Gastro-esophageal reflux disease without esophagitis: Secondary | ICD-10-CM

## 2021-09-30 MED ORDER — GUAIFENESIN-CODEINE 100-10 MG/5ML PO SYRP: 5.0000 mL | ORAL_SOLUTION | Freq: Every evening | ORAL | 0 refills | Status: DC | PRN

## 2021-09-30 NOTE — Assessment & Plan Note (Signed)
Acute, improving. ? ?I instructed her to complete the antibiotics.  She feels she needs a different cough suppressant as her current one is not helping/worsening cough.  I will send in a prescription for codeine/cough with guaifenesin at bedtime. ?

## 2021-09-30 NOTE — Progress Notes (Signed)
Patient ID: Valerie Vargas, female    DOB: 12/26/1945, 76 y.o.   MRN: [id number]  This visit was conducted in person.  BP 132/80   Pulse 88   Temp 98.4 F (36.9 C)   Ht 5\' 3"  (1.6 m)   Wt 146 lb 3.2 oz (66.3 kg)   SpO2 98%   BMI 25.90 kg/m    CC:  Chief Complaint  Patient presents with   Cough    Coughing, hiatal , hernia,  she was started on z-pac last by urgent care   given cough  medicine not helping       Subjective:   HPI: Valerie Vargas is a 76 y.o. female presenting on 09/30/2021 for Cough (Coughing, hiatal , hernia,  she was started on z-pac last by urgent care   given cough  medicine not helping   )   She started with congestion, cough 1 week ago... went to urgent care on  on 2/13.Marland Kitchen Dx with bronchitis...  treated with Zpack and cough syrup. Neg COVID and flu testing  She feels promethazine dextromethorphan.    She feels like hiatal hernia may be worse.. She has upper abdominal pain, reflux.  I cannot find any imaging where hiatal hernia seen.  Barium follow through 2011 showed no hiatal hernia.  EGD normal 02/2021  She has upcoming colonoscopy... followed by GI Dr. Nadara Mustard. HAs not discussed this with Dr. Nadara Mustard.  Relevant past medical, surgical, family and social history reviewed and updated as indicated. Interim medical history since our last visit reviewed. Allergies and medications reviewed and updated. Outpatient Medications Prior to Visit  Medication Sig Dispense Refill   ALPRAZolam (XANAX) 0.5 MG tablet TAKE 1 TABLET BY MOUTH EVERY DAY AS NEEDED 10 tablet 0   Ascorbic Acid (VITAMIN C) 1000 MG tablet Take 1,000 mg by mouth daily.     azithromycin (ZITHROMAX) 250 MG tablet TAKE 2 TABLETS BY MOUTH TODAY, THEN TAKE 1 TABLET DAILY FOR 4 DAYS     benzonatate (TESSALON) 100 MG capsule TAKE TWO CAPSULES BY MOUTH 3 TIMES A DAYAS NEEDED FOR COUGH 60 capsule 1   blood glucose meter kit and supplies KIT Dispense based on patient and insurance preference. Use up to  four times daily as directed. (FOR ICD-9 250.00, 250.01). 1 each 0   Cholecalciferol (VITAMIN D3) 1000 UNITS CAPS Take 1 capsule by mouth daily.     Cranberry 200 MG CAPS Take by mouth.     diphenhydrAMINE-PE-APAP 12.5-5-325 MG TABS Take 0.5 tablets by mouth as needed.     gabapentin (NEURONTIN) 100 MG tablet Take 300 mg by mouth 3 (three) times daily.     glucose blood (ONE TOUCH ULTRA TEST) test strip -TEST TWICE DAILY 100 each 6   Guaifenesin 1200 MG TB12 Take by mouth as needed.     L-Theanine 100 MG CAPS Take 2 tablets by mouth as needed.     levothyroxine (SYNTHROID, LEVOTHROID) 75 MCG tablet Take 75 mcg by mouth daily before breakfast. Brand Synthroid     loratadine (CLARITIN) 10 MG tablet Take 10 mg by mouth daily.     MAGNESIUM GLYCINATE PLUS PO Take 1 capsule by mouth daily.     metoprolol succinate (TOPROL-XL) 50 MG 24 hr tablet TAKE 1 TABLET BY MOUTH EVERY DAY 30 tablet 2   OMEGA-3 1000 MG CAPS Take 1,000 mg by mouth daily.     ONETOUCH DELICA LANCETS 33G MISC -TEST TWICE DAILY 100 each 5  OVER THE COUNTER MEDICATION BLADDER EASE     promethazine-dextromethorphan (PROMETHAZINE-DM) 6.25-15 MG/5ML syrup Take 5 mLs by mouth every 6 (six) hours as needed.     Pyridoxine HCl (VITAMIN B-6 PO) Take 1 tablet by mouth daily.     rosuvastatin (CRESTOR) 10 MG tablet TAKE 1 TABLET BY MOUTH EVERY DAY FOR CHOLESTEROL 90 tablet 1   Thiamine HCl (VITAMIN B-1) 250 MG tablet Take 250 mg by mouth daily.     tolterodine (DETROL LA) 4 MG 24 hr capsule Take 1 capsule (4 mg total) by mouth daily. 30 capsule 11   vitamin B-12 (CYANOCOBALAMIN) 1000 MCG tablet Take 1,000 mcg by mouth daily.     No facility-administered medications prior to visit.     Per HPI unless specifically indicated in ROS section below Review of Systems  Constitutional:  Negative for fatigue and fever.  HENT:  Negative for ear pain.   Eyes:  Negative for pain.  Respiratory:  Negative for chest tightness and shortness of  breath.   Cardiovascular:  Negative for chest pain, palpitations and leg swelling.  Gastrointestinal:  Negative for abdominal pain.  Genitourinary:  Negative for dysuria.  Objective:  BP 132/80   Pulse 88   Temp 98.4 F (36.9 C)   Ht 5\' 3"  (1.6 m)   Wt 146 lb 3.2 oz (66.3 kg)   SpO2 98%   BMI 25.90 kg/m   Wt Readings from Last 3 Encounters:  09/30/21 146 lb 3.2 oz (66.3 kg)  06/30/21 139 lb 12.8 oz (63.4 kg)  06/04/21 137 lb 6 oz (62.3 kg)      Physical Exam Constitutional:      General: She is not in acute distress.    Appearance: Normal appearance. She is well-developed. She is not ill-appearing or toxic-appearing.  HENT:     Head: Normocephalic.     Right Ear: Hearing, tympanic membrane, ear canal and external ear normal. Tympanic membrane is not erythematous, retracted or bulging.     Left Ear: Hearing, tympanic membrane, ear canal and external ear normal. Tympanic membrane is not erythematous, retracted or bulging.     Nose: No mucosal edema or rhinorrhea.     Right Sinus: No maxillary sinus tenderness or frontal sinus tenderness.     Left Sinus: No maxillary sinus tenderness or frontal sinus tenderness.     Mouth/Throat:     Pharynx: Uvula midline.  Eyes:     General: Lids are normal. Lids are everted, no foreign bodies appreciated.     Conjunctiva/sclera: Conjunctivae normal.     Pupils: Pupils are equal, round, and reactive to light.  Neck:     Thyroid: No thyroid mass or thyromegaly.     Vascular: No carotid bruit.     Trachea: Trachea normal.  Cardiovascular:     Rate and Rhythm: Normal rate and regular rhythm.     Pulses: Normal pulses.     Heart sounds: Normal heart sounds, S1 normal and S2 normal. No murmur heard.   No friction rub. No gallop.  Pulmonary:     Effort: Pulmonary effort is normal. No tachypnea or respiratory distress.     Breath sounds: Normal breath sounds. No decreased breath sounds, wheezing, rhonchi or rales.  Abdominal:     General:  Bowel sounds are normal.     Palpations: Abdomen is soft.     Tenderness: There is no abdominal tenderness.  Musculoskeletal:     Cervical back: Normal range of motion and neck supple.  Skin:    General: Skin is warm and dry.     Findings: No rash.  Neurological:     Mental Status: She is alert.  Psychiatric:        Mood and Affect: Mood is not anxious or depressed.        Speech: Speech normal.        Behavior: Behavior normal. Behavior is cooperative.        Thought Content: Thought content normal.        Judgment: Judgment normal.      Results for orders placed or performed in visit on 05/31/21  HM COLONOSCOPY  Result Value Ref Range   HM Colonoscopy See Report (in chart) See Report (in chart), Patient Reported    This visit occurred during the SARS-CoV-2 public health emergency.  Safety protocols were in place, including screening questions prior to the visit, additional usage of staff PPE, and extensive cleaning of exam room while observing appropriate contact time as indicated for disinfecting solutions.   COVID 19 screen:  No recent travel or known exposure to COVID19 The patient denies respiratory symptoms of COVID 19 at this time. The importance of social distancing was discussed today.   Assessment and Plan    Problem List Items Addressed This Visit     Acute bronchitis - Primary    Acute, improving.  I instructed her to complete the antibiotics.  She feels she needs a different cough suppressant as her current one is not helping/worsening cough.  I will send in a prescription for codeine/cough with guaifenesin at bedtime.      GASTROESOPHAGEAL REFLUX DISEASE    Chronic, worsening  She states she has been told in the past that she has a hiatal hernia but I can find no documentation on past imaging for this.  Last barium follow-through showed no hiatal hernia in 2011. I recommended starting proton pump inhibitor such as Prilosec 40 mg daily for 4 to 6 weeks and  avoidance of acidic foods.  She feels like she has tried this at sometime in the remote past and it was not helpful for her.  She would like to have further evaluation of the hiatal hernia.  I have asked her to discuss this with her gastroenterologist but I will look into whether we can move forward with further imaging such as barium follow-through/swallow.  She does also have chronic cough issues of note.      Reviewed urgent care notes as well as  multiple past imaging studies for possible previous diagnosis of hiatal hernia.  Kerby Nora, MD

## 2021-09-30 NOTE — Assessment & Plan Note (Signed)
Chronic, worsening ? ?She states she has been told in the past that she has a hiatal hernia but I can find no documentation on past imaging for this.  Last barium follow-through showed no hiatal hernia in 2011. ?I recommended starting proton pump inhibitor such as Prilosec 40 mg daily for 4 to 6 weeks and avoidance of acidic foods.  She feels like she has tried this at sometime in the remote past and it was not helpful for her.  She would like to have further evaluation of the hiatal hernia.  I have asked her to discuss this with her gastroenterologist but I will look into whether we can move forward with further imaging such as barium follow-through/swallow.  She does also have chronic cough issues of note. ?

## 2021-09-30 NOTE — Patient Instructions (Addendum)
Rest, fluids, can try new cough medication.  ?Given antibiotics more time to improve. ?Start Prilosec  40 mg daily for 4-6 weeks. If that doe not help.. try Pepcid AC twice daily. ? I will let yo know the next step to evaluate the hiatal hernia. ? ?

## 2021-10-01 ENCOUNTER — Telehealth: Payer: PPO

## 2021-10-05 DIAGNOSIS — H02411 Mechanical ptosis of right eyelid: Secondary | ICD-10-CM | POA: Diagnosis not present

## 2021-10-05 DIAGNOSIS — H02831 Dermatochalasis of right upper eyelid: Secondary | ICD-10-CM | POA: Diagnosis not present

## 2021-10-05 DIAGNOSIS — H02412 Mechanical ptosis of left eyelid: Secondary | ICD-10-CM | POA: Diagnosis not present

## 2021-10-05 DIAGNOSIS — H02413 Mechanical ptosis of bilateral eyelids: Secondary | ICD-10-CM | POA: Diagnosis not present

## 2021-10-06 DIAGNOSIS — H53483 Generalized contraction of visual field, bilateral: Secondary | ICD-10-CM | POA: Diagnosis not present

## 2021-10-07 ENCOUNTER — Other Ambulatory Visit: Payer: Self-pay | Admitting: Cardiovascular Disease

## 2021-10-12 ENCOUNTER — Telehealth: Payer: Self-pay | Admitting: Family Medicine

## 2021-10-12 NOTE — Telephone Encounter (Signed)
Patient sees GI Dr. Jamey Reas at [facility].  Can we please contact/call Dr. Joyice Faster office regarding the following: ? ?Valerie Vargas states that she is having upper abdominal pain and reflux.  She states she has been told in the past that she had a hiatal hernia.  She feels strongly she would like to have this repaired if it is causing her symptoms.  ? ? On review of her chart I do not see any hiatal hernia on her barium follow-through in 2011.  The recent EGD done by Dr. Oren Beckmann in August 2022 does not have any comments regarding a hiatal hernia. ? ?My question for Dr. Oren Beckmann is does the patient have a hiatal hernia or not? Would the endoscopy he had in August 2022 be adequate to evaluate for this?  Or do we need to repeat a barium follow-through? ?

## 2021-10-13 NOTE — Telephone Encounter (Addendum)
I have sent this message to Dr. Oren Beckmann via fax through [facility].  I also called Dr. Joyice Faster office and confirmed that they did receive the fax.  It was received and has been sent directly to Dr. Oren Beckmann to review.  FYI to Dr. Diona Browner.  ?

## 2021-10-15 ENCOUNTER — Telehealth: Payer: Self-pay | Admitting: *Deleted

## 2021-10-15 MED ORDER — AZITHROMYCIN 250 MG PO TABS: ORAL_TABLET | ORAL | 0 refills | Status: DC

## 2021-10-15 NOTE — Telephone Encounter (Signed)
Spoke with Anderson Malta at Dr. Joyice Faster office.  She states Dr. Oren Beckmann said yes there is a small hiatal hernia on her upper endoscopy.  Dr. Oren Beckmann would recommend repeat Upper GI Series but she is not sure this would explain Valerie Vargas symptoms.  ?

## 2021-10-15 NOTE — Telephone Encounter (Signed)
Ms. Hardebeck states she  was seen at an Urgent Care and tested negative for flu an covid.  They did not do a chest x-ray but diagnosed her with bronchitis.  She then was seen by Dr. Diona Browner on 09/30/2021 for bronchitis.   Dr. Diona Browner changed her cough medicine and told her to take Mucinex during the day and take the cough syrup at bedtime.  Patient states she has been doing that but she is still coughing with clear phlegm.  She states she is some better but still has coughing fits.  She is asking if she needs another course of antibiotics.  Please advise.  ?

## 2021-10-15 NOTE — Telephone Encounter (Signed)
Valerie Vargas from Dr Oren Beckmann office called in requesting a called back to discuss concern about pt # [phone] ?

## 2021-10-15 NOTE — Telephone Encounter (Signed)
Valerie Vargas notified as instructed by telephone.  She states understanding.  She is scheduled for Colonoscopy with Dr. Oren Beckmann in May and she will address this further with her at that appointment.   ?

## 2021-10-15 NOTE — Telephone Encounter (Signed)
Please let the patient know what Dr. Oren Beckmann  has said.  Emphasize that the hiatal hernia is small and not likely to be causing her symptoms.  If she has questions I be happy to speak with her ?

## 2021-10-18 ENCOUNTER — Encounter: Payer: Self-pay | Admitting: Family Medicine

## 2021-10-18 ENCOUNTER — Other Ambulatory Visit: Payer: Self-pay | Admitting: Family Medicine

## 2021-10-19 ENCOUNTER — Encounter: Payer: Self-pay | Admitting: Cardiovascular Disease

## 2021-10-19 ENCOUNTER — Ambulatory Visit: Payer: PPO | Admitting: Cardiovascular Disease

## 2021-10-19 VITALS — BP 160/78 | HR 69 | Ht 63.0 in | Wt 149.2 lb

## 2021-10-19 DIAGNOSIS — I152 Hypertension secondary to endocrine disorders: Secondary | ICD-10-CM | POA: Diagnosis not present

## 2021-10-19 DIAGNOSIS — E782 Mixed hyperlipidemia: Secondary | ICD-10-CM

## 2021-10-19 DIAGNOSIS — R030 Elevated blood-pressure reading, without diagnosis of hypertension: Secondary | ICD-10-CM

## 2021-10-19 DIAGNOSIS — E1159 Type 2 diabetes mellitus with other circulatory complications: Secondary | ICD-10-CM | POA: Diagnosis not present

## 2021-10-19 DIAGNOSIS — Z87898 Personal history of other specified conditions: Secondary | ICD-10-CM

## 2021-10-19 DIAGNOSIS — G4733 Obstructive sleep apnea (adult) (pediatric): Secondary | ICD-10-CM | POA: Diagnosis not present

## 2021-10-19 MED ORDER — BENZONATATE 100 MG PO CAPS: ORAL_CAPSULE | ORAL | 1 refills | Status: DC

## 2021-10-19 MED ORDER — AMLODIPINE BESYLATE 2.5 MG PO TABS: 2.5000 mg | ORAL_TABLET | Freq: Every day | ORAL | 3 refills | Status: DC

## 2021-10-19 NOTE — Progress Notes (Signed)
? ?Cardiology Office Note   ? ?Date:  10/22/2021  ? ?ID:  Valerie Vargas, DOB 02-29-1946, MRN [id number] ? ?PCP:  Valerie Sanders, MD  ?Cardiologist:  Valerie Majestic, MD (sleep); Dr. Sallyanne Vargas ? ?4 month F/U sleep evaluation ? ? ?History of Present Illness:  ?Valerie Vargas is a 76 y.o. female who is followed by Dr. Sallyanne Vargas for cardiology care.  She has a history of hypertension, hyperlipidemia, type 2 diabetes mellitus, and mild nonobstructive CAD.  She has a history of obstructive sleep most recently diagnosed in 2019.  However she states that she had an original diagnosis in 2005 and had used CPAP previously.  On her diagnostic polysomnogram on May 01, 2018 AHI was 6.2/h, RDI 7.7/h and REM sleep AHI 16.2/h.  She had significant oxygen desaturation to a nadir of 78%.  She underwent CPAP titration on July 01, 2018 was titrated up to 14 cm water pressure with AHI of 0 and O2 nadir at 94%.  She apparently started CPAP in January 2020.  I never saw her in follow-up of her sleep studies or CPAP initiation due to start of the  [place] pandemic.   ? ?I saw her for my initial sleep evaluation on June 30, 2021.  Apparently, she has been using her husband's old CPAP unit from the [facility] hospital since he had received a more recent CPAP machine.  She brought the machine that she had been using with her to the office today which is a ResMed S9 AutoSet unit set at a 14 cm set pressure.  She recently had a syncopal spell when getting out of her shower.  This resulted in significant laceration of her forehead and scalp there was no skull fracture and an incidental note was made of empty sella.  As result of her trauma, she had not used CPAP over the last several months.  She recently saw Dr. Sallyanne Vargas who felt that she would benefit from a sleep evaluation once she is able to wear CPAP again after adequate healing of her forehead laceration. ? ?At her initial evaluation with me, we were able to obtain a download from her  husband's old CPAP machine.  Over the last 6 months, compliance was 60 out of 180 days with average use at only 3 hours and 32 minutes per night.  She had not used any CPAP since her laceration on May 02, 2021.  She states she does have a history of sleepwalking and unconsciously she has taken off her CPAP mask in the past when she was sleeping and was unaware of taking it off.  She has a Fisher-Paykel Simplus full facemask.  She typically goes to bed at 10 PM and wakes up between 5 and 7 AM.  She admits to snoring.  An Epworth Sleepiness Scale score was calculated in the office today and this endorsed at 12 consistent with excessive daytime sleepiness.  During that evaluation, I spent considerable time with she and her husband and at length discussed adverse potential cardiovascular consequences including increased sympathetic tone resulting from frequent arousals contributing to nocturnal palpitations, increased risk for atrial fibrillation, effects on blood pressure control, inflammation, insulin resistance with elevated glucose, GERD, as well as potential nocturnal hypoxemia contributing to both cardiac as well as cerebrovascular ischemia.  I discussed optimal sleep duration for an adult at 7 to 9 hours.  She had issues with parasomnia with sleep walking.  I provided her with a new sample mask which I felt she would  tolerate much better than the old mask that she had been using and she received a ResMed AirFit F30i mask.  I also discussed the new ResMed CPAP device but at present she was content and using her husband's old CPAP unit from the [place] which came to her at no cost. ? ?Since I last saw her, with improvement in her facial trauma from her fall, she started using her husband's old CPAP unit again.  However, download from March 6 through October 19, 2021 continues to show very poor compliance with only 13% of usage days with average use of 2 hours and 57 minutes.  At 14 cm water pressure AHI is 2.4.  She  presents for follow-up evaluation. ? ?An Epworth Sleepiness Scale score was recalculated in office today and this endorsed at 11 consistent with continued excessive daytime sleepiness.  She continues to be on metoprolol succinate 50 mg for hypertension, levothyroxine 75 mcg for hypothyroidism, rosuvastatin 10 mg for hyperlipidemia and is on gabapentin for neuropathy and as needed alprazolam  for anxiety.  She presents for reevaluation. ? ?Past Medical History:  ?Diagnosis Date  ? Allergy, unspecified not elsewhere classified   ? Chronic interstitial cystitis   ? Cough   ? Esophageal reflux   ? H/O syncope 2007  ? No recurrence;Tilt table 09/23/05-negative  ? Hyperlipidemia 08/22/2014  ? Hypertension   ? Hypertriglyceridemia   ? Irritable bowel syndrome   ? Nonocclusive coronary atherosclerosis of native coronary artery   ? Obstructive sleep apnea (adult) (pediatric)   ? Paroxysmal ventricular tachycardia ([place])   ? Type II or unspecified type diabetes mellitus without mention of complication, not stated as uncontrolled   ? Unspecified asthma(493.90)   ? Unspecified hypothyroidism   ? Ventricular tachycardia ([place])   ? ? ?Past Surgical History:  ?Procedure Laterality Date  ? ABDOMINAL HYSTERECTOMY  11/1979  ? for endometriosis  ? CHOLECYSTECTOMY  07/2000  ? ESOPHAGOGASTRODUODENOSCOPY ENDOSCOPY  2010  ? ? ?Current Medications: ?Outpatient Medications Prior to Visit  ?Medication Sig Dispense Refill  ? ALPRAZolam (XANAX) 0.5 MG tablet TAKE 1 TABLET BY MOUTH EVERY DAY AS NEEDED 10 tablet 0  ? Ascorbic Acid (VITAMIN C) 1000 MG tablet Take 1,000 mg by mouth daily.    ? blood glucose meter kit and supplies KIT Dispense based on patient and insurance preference. Use up to four times daily as directed. (FOR ICD-9 250.00, 250.01). 1 each 0  ? botulinum toxin Type A (BOTOX) 100 units SOLR injection For upcoming surgery    ? Cholecalciferol (VITAMIN D3) 1000 UNITS CAPS Take 1 capsule by mouth daily.    ? Cranberry 200 MG CAPS Take by  mouth.    ? diphenhydrAMINE-PE-APAP 12.5-5-325 MG TABS Take 0.5 tablets by mouth as needed.    ? gabapentin (NEURONTIN) 100 MG tablet Take 300 mg by mouth 3 (three) times daily.    ? glucose blood (ONE TOUCH ULTRA TEST) test strip -TEST TWICE DAILY 100 each 6  ? Guaifenesin 1200 MG TB12 Take by mouth as needed.    ? guaiFENesin-codeine (ROBITUSSIN AC) 100-10 MG/5ML syrup Take 5-10 mLs by mouth at bedtime as needed for cough. 180 mL 0  ? levothyroxine (SYNTHROID, LEVOTHROID) 75 MCG tablet Take 75 mcg by mouth daily before breakfast. Brand Synthroid    ? loratadine (CLARITIN) 10 MG tablet Take 10 mg by mouth daily.    ? MAGNESIUM GLYCINATE PLUS PO Take 1 capsule by mouth daily.    ? metoprolol succinate (TOPROL-XL) 50 MG  24 hr tablet TAKE 1 TABLET BY MOUTH EVERY DAY 30 tablet 11  ? OMEGA-3 1000 MG CAPS Take 1,000 mg by mouth daily.    ? ONETOUCH DELICA LANCETS 98O MISC -TEST TWICE DAILY 100 each 5  ? OVER THE COUNTER MEDICATION BLADDER EASE    ? Pyridoxine HCl (VITAMIN B-6 PO) Take 1 tablet by mouth daily.    ? rosuvastatin (CRESTOR) 10 MG tablet TAKE 1 TABLET BY MOUTH EVERY DAY FOR CHOLESTEROL 90 tablet 1  ? Thiamine HCl (VITAMIN B-1) 250 MG tablet Take 250 mg by mouth daily.    ? tolterodine (DETROL LA) 4 MG 24 hr capsule Take 1 capsule (4 mg total) by mouth daily. 30 capsule 11  ? vitamin B-12 (CYANOCOBALAMIN) 1000 MCG tablet Take 1,000 mcg by mouth daily.    ? benzonatate (TESSALON) 100 MG capsule TAKE TWO CAPSULES BY MOUTH 3 TIMES A DAYAS NEEDED FOR COUGH 60 capsule 1  ? azithromycin (ZITHROMAX) 250 MG tablet 2 tab po x 1 day then 1 tab po daily (Patient not taking: Reported on 10/19/2021) 6 tablet 0  ? L-Theanine 100 MG CAPS Take 2 tablets by mouth as needed. (Patient not taking: Reported on 10/19/2021)    ? ?No facility-administered medications prior to visit.  ?  ? ?Allergies:   Elmiron [pentosan polysulfate sodium], Hctz [hydrochlorothiazide], Penicillins, and Sulfa antibiotics  ? ?Social History   ? ?Socioeconomic History  ? Marital status: Married  ?  Spouse name: Not on file  ? Number of children: Not on file  ? Years of education: Not on file  ? Highest education level: Not on file  ?Occupational History  ? [employer]

## 2021-10-19 NOTE — Patient Instructions (Signed)
Medication Instructions:  ?Amlodipine 2.5 mg  one tablet daily  ? ?*If you need a refill on your cardiac medications before your next appointment, please call your pharmacy* ? ? ?Lab Work: ?Not  needed ? ?Testing/Procedures: ?Not needed ? ? ?Follow-Up: ?At [facility], you and your health needs are our priority.  As part of our continuing mission to provide you with exceptional heart care, we have created designated Provider Care Teams.  These Care Teams include your primary Cardiologist (physician) and Advanced Practice Providers (APPs -  Physician Assistants and Nurse Practitioners) who all work together to provide you with the care you need, when you need it. ? ?  ? ?Your next appointment:   ?As needed  ? ?The format for your next appointment:   ?In Person ? ?Provider:   ?Dr Claiborne Billings  ? ?Your physician recommends that you schedule a follow-up appointment in:  Sanda Klein, MD  blood pressure ? ? ?Other Instructions  ?Continue using C-PAP ?

## 2021-10-22 ENCOUNTER — Encounter: Payer: Self-pay | Admitting: Cardiovascular Disease

## 2021-11-22 ENCOUNTER — Encounter: Payer: Self-pay | Admitting: Family Medicine

## 2021-11-22 DIAGNOSIS — D120 Benign neoplasm of cecum: Secondary | ICD-10-CM | POA: Diagnosis not present

## 2021-11-22 DIAGNOSIS — K635 Polyp of colon: Secondary | ICD-10-CM | POA: Diagnosis not present

## 2021-11-22 DIAGNOSIS — Z7984 Long term (current) use of oral hypoglycemic drugs: Secondary | ICD-10-CM | POA: Diagnosis not present

## 2021-11-22 DIAGNOSIS — Z1211 Encounter for screening for malignant neoplasm of colon: Secondary | ICD-10-CM | POA: Diagnosis not present

## 2021-11-22 DIAGNOSIS — E119 Type 2 diabetes mellitus without complications: Secondary | ICD-10-CM | POA: Diagnosis not present

## 2021-11-22 DIAGNOSIS — D123 Benign neoplasm of transverse colon: Secondary | ICD-10-CM | POA: Diagnosis not present

## 2021-11-22 DIAGNOSIS — D125 Benign neoplasm of sigmoid colon: Secondary | ICD-10-CM | POA: Diagnosis not present

## 2021-11-22 DIAGNOSIS — D126 Benign neoplasm of colon, unspecified: Secondary | ICD-10-CM | POA: Diagnosis not present

## 2021-11-22 DIAGNOSIS — K5730 Diverticulosis of large intestine without perforation or abscess without bleeding: Secondary | ICD-10-CM | POA: Diagnosis not present

## 2021-11-22 DIAGNOSIS — Z86010 Personal history of colonic polyps: Secondary | ICD-10-CM | POA: Diagnosis not present

## 2021-11-22 LAB — HM COLONOSCOPY

## 2021-11-24 ENCOUNTER — Ambulatory Visit: Payer: PPO | Admitting: Cardiovascular Disease

## 2021-11-24 VITALS — BP 139/79 | HR 73 | Ht 63.0 in | Wt 146.6 lb

## 2021-11-24 DIAGNOSIS — R55 Syncope and collapse: Secondary | ICD-10-CM

## 2021-11-24 DIAGNOSIS — E236 Other disorders of pituitary gland: Secondary | ICD-10-CM | POA: Diagnosis not present

## 2021-11-24 DIAGNOSIS — I10 Essential (primary) hypertension: Secondary | ICD-10-CM

## 2021-11-24 DIAGNOSIS — E119 Type 2 diabetes mellitus without complications: Secondary | ICD-10-CM | POA: Diagnosis not present

## 2021-11-24 DIAGNOSIS — G4733 Obstructive sleep apnea (adult) (pediatric): Secondary | ICD-10-CM

## 2021-11-24 DIAGNOSIS — E782 Mixed hyperlipidemia: Secondary | ICD-10-CM | POA: Diagnosis not present

## 2021-11-24 MED ORDER — LOSARTAN POTASSIUM 25 MG PO TABS: 25.0000 mg | ORAL_TABLET | Freq: Every day | ORAL | 0 refills | Status: DC | PRN

## 2021-11-24 NOTE — Patient Instructions (Signed)
Medication Instructions:  ?STOP the Amlodipine ? ?TAKE: Losartan 25 mg once daily as needed for systolic symptomatic blood pressure over 180 ? ?*If you need a refill on your cardiac medications before your next appointment, please call your pharmacy* ? ? ?Lab Work: ?None ordered ?If you have labs (blood work) drawn today and your tests are completely normal, you will receive your results only by: ?MyChart Message (if you have MyChart) OR ?A paper copy in the mail ?If you have any lab test that is abnormal or we need to change your treatment, we will call you to review the results. ? ? ?Testing/Procedures: ?None ordered ? ? ?Follow-Up: ?At [facility], you and your health needs are our priority.  As part of our continuing mission to provide you with exceptional heart care, we have created designated Provider Care Teams.  These Care Teams include your primary Cardiologist (physician) and Advanced Practice Providers (APPs -  Physician Assistants and Nurse Practitioners) who all work together to provide you with the care you need, when you need it. ? ?We recommend signing up for the patient portal called "MyChart".  Sign up information is provided on this After Visit Summary.  MyChart is used to connect with patients for Virtual Visits (Telemedicine).  Patients are able to view lab/test results, encounter notes, upcoming appointments, etc.  Non-urgent messages can be sent to your provider as well.   ?To learn more about what you can do with MyChart, go to [email or web address].   ? ?Your next appointment:   ?12 month(s) ? ?The format for your next appointment:   ?In Person ? ?Provider:   ?Sanda Klein, MD { ? ?Important Information About Sugar ? ? ? ? ? ? ?

## 2021-11-30 ENCOUNTER — Encounter: Payer: Self-pay | Admitting: Cardiovascular Disease

## 2021-11-30 NOTE — Progress Notes (Signed)
Patient ID: Valerie Vargas, female   DOB: Apr 06, 1946, 76 y.o.   MRN: [id number] ?  ? ?Cardiology Office Note   ? ?Date:  11/30/2021  ? ?ID:  KRYSTEL FLETCHALL, DOB 1946-05-25, MRN [id number] ? ?PCP:  Jinny Sanders, MD  ?Cardiologist:   Sanda Klein, MD  ? ?Chief Complaint  ?Patient presents with  ? Loss of Consciousness  ? ? ?History of Present Illness:  ?Valerie Vargas is a 76 y.o. female with OSA, a remote history of syncope in 2007, that recurred following a hot shower in 2022 , minor coronary artery disease by angiography in 2003, hypertension, hyperlipidemia, type 2 diabetes mellitus. ? ?She has not had any new syncopal events and denies any prodromal events, except for one evening on April 7.  She checked her blood pressure then and it was 116/61 with a heart rate in the 70s. ? ?For the most part her blood pressure has been in the high 130s/60s with episodic increases.  It is rare for the systolic blood pressure to be over 150 typical heart rate is in the mid 70s.  Blood pressure tends to be a little higher in the evenings.  Most recent blood pressure recorded at home was 142/76. ? ?The patient specifically denies any chest pain at rest exertion, dyspnea at rest or with exertion, orthopnea, paroxysmal nocturnal dyspnea, syncope, palpitations, focal neurological deficits, intermittent claudication, lower extremity edema, unexplained weight gain, cough, hemoptysis or wheezing. ? ?Her current antihypertensive medications are metoprolol succinate 50 mg once daily.  She also has a prescription for amlodipine 2.5 mg daily but has not been taking this.  She is not taking any diuretics.  Blood sugar control is good with a recent hemoglobin A1c of 6.6%.  Most recent LDL cholesterol on rosuvastatin is 51. ? ?She has had a syncopal event for the first time in about 15 years.  This occurred while she was taking a hot shower.  She felt the typical prodromal symptoms and sat down on the shower chair.  She tried to get up  with the symptoms recur so she sat down again for a longer period of time.  She got up once more and this time felt better but lost consciousness as she was stepping out of the shower.  She hit her head against the knob of the vanity and had an extensive laceration of her forehead and scalp.  Thankfully there was no intracranial bleeding and no skull fracture.  Incidental note made of empty sella.  She received multiple stitches. ? ?She has had vision problems related to a macular hole in her dominant right eye (her left eye has problems due to a childhood injury).  Even after cataract surgery she still only has 20/80 vision and she has been avoiding driving.  She has seen a retina specialist in hopes to see some improvement with ? ?She has difficulty keeping the CPAP on all night. ? ?In the past a tilt table test demonstrated that she had orthostatic hypotension. Normal Vascuscreen (minimal carotid plaque, normal ABI, no AAA) in May 2018. ? ? ? ?Past Medical History:  ?Diagnosis Date  ? Allergy, unspecified not elsewhere classified   ? Chronic interstitial cystitis   ? Cough   ? Esophageal reflux   ? H/O syncope 2007  ? No recurrence;Tilt table 09/23/05-negative  ? Hyperlipidemia 08/22/2014  ? Hypertension   ? Hypertriglyceridemia   ? Irritable bowel syndrome   ? Nonocclusive coronary atherosclerosis of native coronary artery   ?  Obstructive sleep apnea (adult) (pediatric)   ? Paroxysmal ventricular tachycardia ([place])   ? Type II or unspecified type diabetes mellitus without mention of complication, not stated as uncontrolled   ? Unspecified asthma(493.90)   ? Unspecified hypothyroidism   ? Ventricular tachycardia ([place])   ? ? ?Past Surgical History:  ?Procedure Laterality Date  ? ABDOMINAL HYSTERECTOMY  11/1979  ? for endometriosis  ? CHOLECYSTECTOMY  07/2000  ? ESOPHAGOGASTRODUODENOSCOPY ENDOSCOPY  2010  ? ? ?Outpatient Medications Prior to Visit  ?Medication Sig Dispense Refill  ? Ascorbic Acid (VITAMIN C) 1000 MG  tablet Take 1,000 mg by mouth daily.    ? blood glucose meter kit and supplies KIT Dispense based on patient and insurance preference. Use up to four times daily as directed. (FOR ICD-9 250.00, 250.01). 1 each 0  ? Cholecalciferol (VITAMIN D3) 1000 UNITS CAPS Take 1 capsule by mouth daily.    ? Cranberry 200 MG CAPS Take by mouth daily in the afternoon.    ? diphenhydrAMINE-PE-APAP 12.5-5-325 MG TABS Take 0.5 tablets by mouth as needed.    ? gabapentin (NEURONTIN) 100 MG tablet Take 300 mg by mouth 3 (three) times daily.    ? glucose blood (ONE TOUCH ULTRA TEST) test strip -TEST TWICE DAILY 100 each 6  ? levothyroxine (SYNTHROID, LEVOTHROID) 75 MCG tablet Take 75 mcg by mouth daily before breakfast. Brand Synthroid    ? loratadine (CLARITIN) 10 MG tablet Take 10 mg by mouth daily.    ? MAGNESIUM GLYCINATE PLUS PO Take 1 capsule by mouth daily.    ? metoprolol succinate (TOPROL-XL) 50 MG 24 hr tablet TAKE 1 TABLET BY MOUTH EVERY DAY 30 tablet 11  ? OMEGA-3 1000 MG CAPS Take 1,000 mg by mouth daily.    ? omeprazole (PRILOSEC) 40 MG capsule Take 40 mg by mouth daily.    ? ONETOUCH DELICA LANCETS 28Z MISC -TEST TWICE DAILY 100 each 5  ? OVER THE COUNTER MEDICATION BLADDER EASE    ? Pyridoxine HCl (VITAMIN B-6 PO) Take 1 tablet by mouth daily.    ? rosuvastatin (CRESTOR) 10 MG tablet TAKE 1 TABLET BY MOUTH EVERY DAY FOR CHOLESTEROL 90 tablet 1  ? Thiamine HCl (VITAMIN B-1) 250 MG tablet Take 250 mg by mouth daily.    ? vitamin B-12 (CYANOCOBALAMIN) 1000 MCG tablet Take 1,000 mcg by mouth daily.    ? ALPRAZolam (XANAX) 0.5 MG tablet TAKE 1 TABLET BY MOUTH EVERY DAY AS NEEDED (Patient not taking: Reported on 11/24/2021) 10 tablet 0  ? benzonatate (TESSALON) 100 MG capsule TAKE TWO CAPSULES BY MOUTH 3 TIMES A DAYAS NEEDED FOR COUGH (Patient not taking: Reported on 11/24/2021) 60 capsule 1  ? botulinum toxin Type A (BOTOX) 100 units SOLR injection For upcoming surgery (Patient not taking: Reported on 11/24/2021)    ?  Guaifenesin 1200 MG TB12 Take by mouth as needed. (Patient not taking: Reported on 11/24/2021)    ? guaiFENesin-codeine (ROBITUSSIN AC) 100-10 MG/5ML syrup Take 5-10 mLs by mouth at bedtime as needed for cough. (Patient not taking: Reported on 11/24/2021) 180 mL 0  ? metFORMIN (GLUCOPHAGE) 500 MG tablet Take 500 mg by mouth 2 (two) times daily. (Patient not taking: Reported on 11/24/2021)    ? tolterodine (DETROL LA) 4 MG 24 hr capsule Take 1 capsule (4 mg total) by mouth daily. (Patient not taking: Reported on 11/24/2021) 30 capsule 11  ? amLODipine (NORVASC) 2.5 MG tablet Take 1 tablet (2.5 mg total) by mouth daily. (Patient  not taking: Reported on 11/24/2021) 90 tablet 3  ? ?No facility-administered medications prior to visit.  ?  ? ?Allergies:   Elmiron [pentosan polysulfate sodium], Hctz [hydrochlorothiazide], Penicillins, and Sulfa antibiotics  ? ?Social History  ? ?Socioeconomic History  ? Marital status: Married  ?  Spouse name: Not on file  ? Number of children: Not on file  ? Years of education: Not on file  ? Highest education level: Not on file  ?Occupational History  ? Occupation: retired  ?Tobacco Use  ? Smoking status: Never  ? Smokeless tobacco: Never  ?Vaping Use  ? Vaping Use: Never used  ?Substance and Sexual Activity  ? Alcohol use: No  ? Drug use: No  ? Sexual activity: Yes  ?Other Topics Concern  ? Not on file  ?Social History Narrative  ? Not on file  ? ?Social Determinants of Health  ? ?Financial Resource Strain: Low Risk   ? Difficulty of Paying Living Expenses: Not hard at all  ?Food Insecurity: Not on file  ?Transportation Needs: Not on file  ?Physical Activity: Not on file  ?Stress: Not on file  ?Social Connections: Not on file  ?  ? ?Family History:  The patient's family history includes Arthritis in her mother; Breast cancer in her maternal aunt, mother, and paternal aunt; Diabetes in her father and mother; Heart disease in her father; Leukemia in her brother; Stomach cancer in her father.   ? ?ROS:   ?Please see the history of present illness.    ?ROS  ?All other systems are reviewed and are negative ? ?PHYSICAL EXAM:   ?VS:  BP 139/79   Pulse 73   Ht 5' 3" (1.6 m)   Wt 146 lb 9.6 oz (66.5 kg)   SpO2 96%

## 2021-12-06 ENCOUNTER — Other Ambulatory Visit: Payer: Self-pay | Admitting: Family Medicine

## 2021-12-06 DIAGNOSIS — Z1231 Encounter for screening mammogram for malignant neoplasm of breast: Secondary | ICD-10-CM

## 2021-12-10 ENCOUNTER — Ambulatory Visit
Admission: RE | Admit: 2021-12-10 | Discharge: 2021-12-10 | Disposition: A | Discharge status: Home / Self Care | Payer: PPO | Source: Ambulatory Visit | Attending: Family Medicine | Admitting: Family Medicine

## 2021-12-10 DIAGNOSIS — Z1231 Encounter for screening mammogram for malignant neoplasm of breast: Secondary | ICD-10-CM

## 2021-12-15 DIAGNOSIS — G5132 Clonic hemifacial spasm, left: Secondary | ICD-10-CM | POA: Diagnosis not present

## 2021-12-15 DIAGNOSIS — G245 Blepharospasm: Secondary | ICD-10-CM | POA: Diagnosis not present

## 2021-12-28 ENCOUNTER — Other Ambulatory Visit: Payer: Self-pay | Admitting: Cardiovascular Disease

## 2021-12-31 DIAGNOSIS — H02423 Myogenic ptosis of bilateral eyelids: Secondary | ICD-10-CM | POA: Diagnosis not present

## 2021-12-31 DIAGNOSIS — H02831 Dermatochalasis of right upper eyelid: Secondary | ICD-10-CM | POA: Diagnosis not present

## 2021-12-31 DIAGNOSIS — H02834 Dermatochalasis of left upper eyelid: Secondary | ICD-10-CM | POA: Diagnosis not present

## 2021-12-31 DIAGNOSIS — H02411 Mechanical ptosis of right eyelid: Secondary | ICD-10-CM | POA: Diagnosis not present

## 2021-12-31 DIAGNOSIS — G519 Disorder of facial nerve, unspecified: Secondary | ICD-10-CM | POA: Diagnosis not present

## 2021-12-31 DIAGNOSIS — H02412 Mechanical ptosis of left eyelid: Secondary | ICD-10-CM | POA: Diagnosis not present

## 2021-12-31 DIAGNOSIS — H02422 Myogenic ptosis of left eyelid: Secondary | ICD-10-CM | POA: Diagnosis not present

## 2021-12-31 DIAGNOSIS — H57813 Brow ptosis, bilateral: Secondary | ICD-10-CM | POA: Diagnosis not present

## 2021-12-31 DIAGNOSIS — H02421 Myogenic ptosis of right eyelid: Secondary | ICD-10-CM | POA: Diagnosis not present

## 2021-12-31 DIAGNOSIS — H02413 Mechanical ptosis of bilateral eyelids: Secondary | ICD-10-CM | POA: Diagnosis not present

## 2021-12-31 DIAGNOSIS — H53483 Generalized contraction of visual field, bilateral: Secondary | ICD-10-CM | POA: Diagnosis not present

## 2021-12-31 DIAGNOSIS — G510 Bell's palsy: Secondary | ICD-10-CM | POA: Diagnosis not present

## 2022-01-19 DIAGNOSIS — K582 Mixed irritable bowel syndrome: Secondary | ICD-10-CM | POA: Diagnosis not present

## 2022-01-19 DIAGNOSIS — Z880 Allergy status to penicillin: Secondary | ICD-10-CM | POA: Diagnosis not present

## 2022-01-19 DIAGNOSIS — K5900 Constipation, unspecified: Secondary | ICD-10-CM | POA: Diagnosis not present

## 2022-01-19 DIAGNOSIS — R159 Full incontinence of feces: Secondary | ICD-10-CM | POA: Diagnosis not present

## 2022-01-19 DIAGNOSIS — R6881 Early satiety: Secondary | ICD-10-CM | POA: Diagnosis not present

## 2022-01-19 DIAGNOSIS — K219 Gastro-esophageal reflux disease without esophagitis: Secondary | ICD-10-CM | POA: Diagnosis not present

## 2022-01-19 DIAGNOSIS — Z888 Allergy status to other drugs, medicaments and biological substances status: Secondary | ICD-10-CM | POA: Diagnosis not present

## 2022-01-19 DIAGNOSIS — Z882 Allergy status to sulfonamides status: Secondary | ICD-10-CM | POA: Diagnosis not present

## 2022-01-19 DIAGNOSIS — R140 Abdominal distension (gaseous): Secondary | ICD-10-CM | POA: Diagnosis not present

## 2022-01-19 DIAGNOSIS — R194 Change in bowel habit: Secondary | ICD-10-CM | POA: Diagnosis not present

## 2022-01-30 HISTORY — PX: EYE SURGERY: SHX253

## 2022-02-07 ENCOUNTER — Other Ambulatory Visit: Payer: Self-pay | Admitting: Family Medicine

## 2022-02-07 NOTE — Telephone Encounter (Signed)
Please schedule Medicare Wellness visit with nurse and CPE with fasting labs prior with Dr. Diona Browner for sometime after 04/01/2022.

## 2022-02-07 NOTE — Telephone Encounter (Signed)
Scheduled for 04/04/22

## 2022-02-08 ENCOUNTER — Encounter: Payer: Self-pay | Admitting: *Deleted

## 2022-02-08 DIAGNOSIS — E119 Type 2 diabetes mellitus without complications: Secondary | ICD-10-CM | POA: Diagnosis not present

## 2022-02-08 DIAGNOSIS — H26492 Other secondary cataract, left eye: Secondary | ICD-10-CM | POA: Diagnosis not present

## 2022-02-08 LAB — HM DIABETES EYE EXAM

## 2022-02-10 DIAGNOSIS — R143 Flatulence: Secondary | ICD-10-CM | POA: Diagnosis not present

## 2022-02-10 DIAGNOSIS — R140 Abdominal distension (gaseous): Secondary | ICD-10-CM | POA: Diagnosis not present

## 2022-02-10 DIAGNOSIS — R6881 Early satiety: Secondary | ICD-10-CM | POA: Diagnosis not present

## 2022-02-10 DIAGNOSIS — R7309 Other abnormal glucose: Secondary | ICD-10-CM | POA: Diagnosis not present

## 2022-02-14 DIAGNOSIS — K580 Irritable bowel syndrome with diarrhea: Secondary | ICD-10-CM | POA: Diagnosis not present

## 2022-02-14 DIAGNOSIS — R159 Full incontinence of feces: Secondary | ICD-10-CM | POA: Diagnosis not present

## 2022-02-14 DIAGNOSIS — H26492 Other secondary cataract, left eye: Secondary | ICD-10-CM | POA: Diagnosis not present

## 2022-03-04 ENCOUNTER — Telehealth: Payer: Self-pay | Admitting: Family Medicine

## 2022-03-04 DIAGNOSIS — M25511 Pain in right shoulder: Secondary | ICD-10-CM | POA: Diagnosis not present

## 2022-03-04 DIAGNOSIS — M542 Cervicalgia: Secondary | ICD-10-CM | POA: Diagnosis not present

## 2022-03-04 NOTE — Telephone Encounter (Signed)
[facility] records have been received. Thank you!

## 2022-03-21 LAB — HM DIABETES FOOT EXAM

## 2022-03-22 ENCOUNTER — Ambulatory Visit: Payer: PPO

## 2022-04-04 ENCOUNTER — Ambulatory Visit (INDEPENDENT_AMBULATORY_CARE_PROVIDER_SITE_OTHER): Payer: PPO

## 2022-04-04 VITALS — Ht 63.0 in | Wt 135.0 lb

## 2022-04-04 DIAGNOSIS — E1165 Type 2 diabetes mellitus with hyperglycemia: Secondary | ICD-10-CM | POA: Diagnosis not present

## 2022-04-04 DIAGNOSIS — I10 Essential (primary) hypertension: Secondary | ICD-10-CM | POA: Diagnosis not present

## 2022-04-04 DIAGNOSIS — Z0000 Encounter for general adult medical examination without abnormal findings: Secondary | ICD-10-CM

## 2022-04-04 DIAGNOSIS — E039 Hypothyroidism, unspecified: Secondary | ICD-10-CM | POA: Diagnosis not present

## 2022-04-04 DIAGNOSIS — E7800 Pure hypercholesterolemia, unspecified: Secondary | ICD-10-CM | POA: Diagnosis not present

## 2022-04-04 DIAGNOSIS — R7989 Other specified abnormal findings of blood chemistry: Secondary | ICD-10-CM | POA: Diagnosis not present

## 2022-04-04 DIAGNOSIS — I951 Orthostatic hypotension: Secondary | ICD-10-CM | POA: Diagnosis not present

## 2022-04-04 DIAGNOSIS — E236 Other disorders of pituitary gland: Secondary | ICD-10-CM | POA: Diagnosis not present

## 2022-04-04 NOTE — Progress Notes (Signed)
I connected with Valerie Vargas today by telephone and verified that I am speaking with the correct person using two identifiers. Location patient: home Location provider: work Persons participating in the virtual visit: Ambrosia, Wisnewski LPN.   I discussed the limitations, risks, security and privacy concerns of performing an evaluation and management service by telephone and the availability of in person appointments. I also discussed with the patient that there may be a patient responsible charge related to this service. The patient expressed understanding and verbally consented to this telephonic visit.    Interactive audio and video telecommunications were attempted between this provider and patient, however failed, due to patient having technical difficulties OR patient did not have access to video capability.  We continued and completed visit with audio only.     Vital signs may be patient reported or missing.  Subjective:   Valerie Vargas is a 76 y.o. female who presents for Medicare Annual (Subsequent) preventive examination.  Review of Systems     Cardiac Risk Factors include: advanced age (>50mn, >>23women);diabetes mellitus;dyslipidemia     Objective:    Today's Vitals   04/04/22 0813  Weight: 135 lb (61.2 kg)  Height: '5\' 3"'  (1.6 m)   Body mass index is 23.91 kg/m.     04/04/2022    8:27 AM 03/20/2020   11:29 AM 02/19/2019    2:14 PM 07/01/2018    8:45 PM 05/01/2018    9:18 PM 02/09/2018    9:49 AM 02/08/2017   10:49 AM  Advanced Directives  Does Patient Have a Medical Advance Directive? Yes Yes Yes Yes Yes Yes Yes  Type of AParamedicof [place]Living will [place]art[place]Living will HBondLiving will HCave SpringLiving will HSpackenkillLiving will HRuthLiving will HGarden City ParkLiving will  Does patient want to make  changes to medical advance directive?    No - Patient declined No - Patient declined    Copy of HGilbertin Chart? No - copy requested No - copy requested No - copy requested  No - copy requested No - copy requested No - copy requested  Would patient like information on creating a medical advance directive?    No - Patient declined       Current Medications (verified) Outpatient Encounter Medications as of 04/04/2022  Medication Sig   ALPRAZolam (XANAX) 0.5 MG tablet TAKE 1 TABLET BY MOUTH EVERY DAY AS NEEDED   Ascorbic Acid (VITAMIN C) 1000 MG tablet Take 1,000 mg by mouth daily.   benzonatate (TESSALON) 100 MG capsule TAKE TWO CAPSULES BY MOUTH 3 TIMES A DAYAS NEEDED FOR COUGH   Cholecalciferol (VITAMIN D3) 1000 UNITS CAPS Take 1 capsule by mouth daily.   Cranberry 200 MG CAPS Take by mouth daily in the afternoon.   diphenhydrAMINE-PE-APAP 12.5-5-325 MG TABS Take 0.5 tablets by mouth as needed.   gabapentin (NEURONTIN) 100 MG tablet Take 300 mg by mouth 3 (three) times daily.   glucose blood (ONE TOUCH ULTRA TEST) test strip -TEST TWICE DAILY   levothyroxine (SYNTHROID, LEVOTHROID) 75 MCG tablet Take 75 mcg by mouth daily before breakfast. Brand Synthroid   loratadine (CLARITIN) 10 MG tablet Take 10 mg by mouth daily.   losartan (COZAAR) 25 MG tablet TAKE 1 TABLET BY MOUTH EVERY DAY AS NEEDED (FOR SYMPTOMATIC BLOOD PRESSURE SYSTOLIC OVER 1353   MAGNESIUM GLYCINATE PLUS PO Take 1 capsule by mouth  daily.   metoprolol succinate (TOPROL-XL) 50 MG 24 hr tablet TAKE 1 TABLET BY MOUTH EVERY DAY   OMEGA-3 1000 MG CAPS Take 1,000 mg by mouth daily.   omeprazole (PRILOSEC) 40 MG capsule Take 40 mg by mouth daily.   ONETOUCH DELICA LANCETS 39J MISC -TEST TWICE DAILY   Pyridoxine HCl (VITAMIN B-6 PO) Take 1 tablet by mouth daily.   rosuvastatin (CRESTOR) 10 MG tablet TAKE 1 TABLET BY MOUTH EVERY DAY FOR CHOLESTEROL   Thiamine HCl (VITAMIN B-1) 250 MG tablet Take 250 mg by mouth  daily.   vitamin B-12 (CYANOCOBALAMIN) 1000 MCG tablet Take 1,000 mcg by mouth daily.   blood glucose meter kit and supplies KIT Dispense based on patient and insurance preference. Use up to four times daily as directed. (FOR ICD-9 250.00, 250.01).   botulinum toxin Type A (BOTOX) 100 units SOLR injection For upcoming surgery (Patient not taking: Reported on 11/24/2021)   Guaifenesin 1200 MG TB12 Take by mouth as needed. (Patient not taking: Reported on 11/24/2021)   guaiFENesin-codeine (ROBITUSSIN AC) 100-10 MG/5ML syrup Take 5-10 mLs by mouth at bedtime as needed for cough. (Patient not taking: Reported on 11/24/2021)   metFORMIN (GLUCOPHAGE) 500 MG tablet Take 500 mg by mouth 2 (two) times daily. (Patient not taking: Reported on 11/24/2021)   OVER THE COUNTER MEDICATION BLADDER EASE (Patient not taking: Reported on 04/04/2022)   tolterodine (DETROL LA) 4 MG 24 hr capsule Take 1 capsule (4 mg total) by mouth daily. (Patient not taking: Reported on 11/24/2021)   No facility-administered encounter medications on file as of 04/04/2022.    Allergies (verified) Elmiron [pentosan polysulfate sodium], Hctz [hydrochlorothiazide], Penicillins, and Sulfa antibiotics   History: Past Medical History:  Diagnosis Date   Allergy, unspecified not elsewhere classified    Chronic interstitial cystitis    Cough    Esophageal reflux    H/O syncope 2007   No recurrence;Tilt table 09/23/05-negative   Hyperlipidemia 08/22/2014   Hypertension    Hypertriglyceridemia    Irritable bowel syndrome    Nonocclusive coronary atherosclerosis of native coronary artery    Obstructive sleep apnea (adult) (pediatric)    Paroxysmal ventricular tachycardia (HCC)    Type II or unspecified type diabetes mellitus without mention of complication, not stated as uncontrolled    Unspecified asthma(493.90)    Unspecified hypothyroidism    Ventricular tachycardia (HCC)    Past Surgical History:  Procedure Laterality Date   ABDOMINAL  HYSTERECTOMY  11/16/1979   for endometriosis   CHOLECYSTECTOMY  07/18/2000   ESOPHAGOGASTRODUODENOSCOPY ENDOSCOPY  07/18/2008   EYE SURGERY Left 01/30/2022   FACIAL NERVE SURGERY     12/2021   Family History  Problem Relation Age of Onset   Diabetes Mother    Breast cancer Mother    Arthritis Mother    Diabetes Father    Stomach cancer Father    Heart disease Father    Leukemia Brother    Breast cancer Maternal Aunt    Breast cancer Paternal Aunt    Social History   Socioeconomic History   Marital status: Married    Spouse name: Not on file   Number of children: Not on file   Years of education: Not on file   Highest education level: Not on file  Occupational History   Occupation: retired  Tobacco Use   Smoking status: Never   Smokeless tobacco: Never  Vaping Use   Vaping Use: Never used  Substance and Sexual Activity   Alcohol  use: No   Drug use: No   Sexual activity: Yes  Other Topics Concern   Not on file  Social History Narrative   Not on file   Social Determinants of Health   Financial Resource Strain: Low Risk  (04/04/2022)   Overall Financial Resource Strain (CARDIA)    Difficulty of Paying Living Expenses: Not hard at all  Food Insecurity: No Food Insecurity (04/04/2022)   Hunger Vital Sign    Worried About Running Out of Food in the Last Year: Never true    Ran Out of Food in the Last Year: Never true  Transportation Needs: No Transportation Needs (04/04/2022)   PRAPARE - Hydrologist (Medical): No    Lack of Transportation (Non-Medical): No  Physical Activity: Inactive (04/04/2022)   Exercise Vital Sign    Days of Exercise per Week: 0 days    Minutes of Exercise per Session: 0 min  Stress: No Stress Concern Present (04/04/2022)   [place]    Feeling of Stress : Not at all  Social Connections: Not on file    Tobacco Counseling Counseling given: Not  Answered   Clinical Intake:  Pre-visit preparation completed: Yes  Pain : No/denies pain     Nutritional Status: BMI of 19-24  Normal Nutritional Risks: None Diabetes: Yes  How often do you need to have someone help you when you read instructions, pamphlets, or other written materials from your doctor or pharmacy?: 1 - Never What is the last grade level you completed in school?: some college  Diabetic? Yes Nutrition Risk Assessment:  Has the patient had any N/V/D within the last 2 months?  No  Does the patient have any non-healing wounds?  No  Has the patient had any unintentional weight loss or weight gain?  No   Diabetes:  Is the patient diabetic?  Yes  If diabetic, was a CBG obtained today?  No  Did the patient bring in their glucometer from home?  No  How often do you monitor your CBG's? Only when feels bad.   Financial Strains and Diabetes Management:  Are you having any financial strains with the device, your supplies or your medication? No .  Does the patient want to be seen by Chronic Care Management for management of their diabetes?  No  Would the patient like to be referred to a Nutritionist or for Diabetic Management?  No   Diabetic Exams:  Diabetic Eye Exam: Completed 02/08/2022 Diabetic Foot Exam: Overdue, Pt has been advised about the importance in completing this exam. Pt is scheduled for diabetic foot exam on next appointment.   Interpreter Needed?: No  Information entered by :: NAllen LPN   Activities of Daily Living    04/04/2022    8:29 AM  In your present state of health, do you have any difficulty performing the following activities:  Hearing? 1  Vision? 1  Difficulty concentrating or making decisions? 1  Walking or climbing stairs? 1  Dressing or bathing? 0  Doing errands, shopping? 1  Comment does not drive  Preparing Food and eating ? N  Using the Toilet? N  In the past six months, have you accidently leaked urine? Y  Do you have  problems with loss of bowel control? Y  Managing your Medications? N  Managing your Finances? N  Housekeeping or managing your Housekeeping? N    Patient Care Team: Jinny Sanders, MD as  PCP - General (Family Medicine) Croitoru, Dani Gobble, MD as PCP - Cardiology (Cardiology) Troy Sine, MD as PCP - Sleep Medicine (Cardiology) Jacelyn Pi, MD as Attending Physician (Endocrinology) Ernestine Conrad, MD as Referring Physician (Otolaryngology) Jola Schmidt, MD as Consulting Physician (Ophthalmology) Levin Erp, DDS, PA as Referring Physician (Dentistry) Debbora Dus, [facility] as Pharmacist (Pharmacist) Jamey Reas, MD as Referring Physician (Gastroenterology)  Indicate any recent Medical Services you may have received from other than [facility] providers in the past year (date may be approximate).     Assessment:   This is a routine wellness examination for Elizzie.  Hearing/Vision screen Vision Screening - Comments:: Regular eye exams, [facility]  Dietary issues and exercise activities discussed: Current Exercise Habits: The patient does not participate in regular exercise at present   Goals Addressed             This Visit's Progress    Patient Stated       04/04/2022, wants to get in better shape       Depression Screen    04/04/2022    8:29 AM 09/30/2021    4:08 PM 04/01/2021   10:21 AM 03/20/2020   11:35 AM 02/19/2019    2:14 PM 02/09/2018    9:50 AM 02/08/2017   10:48 AM  PHQ 2/9 Scores  PHQ - 2 Score 0 0 0 0 0 0 0  PHQ- 9 Score    0 6 0     Fall Risk    04/04/2022    8:28 AM 09/30/2021    4:08 PM 04/01/2021   10:20 AM 03/20/2020   11:33 AM 02/19/2019    2:14 PM  Fall Risk   Falls in the past year? 1 0 1 0 0  Comment passed out      Number falls in past yr: 0  0 0   Injury with Fall? 1 0 1 0   Risk for fall due to : Impaired balance/gait;Medication side effect   Medication side effect Medication side effect  Follow up Falls evaluation  completed;Education provided;Falls prevention discussed Falls evaluation completed  Falls evaluation completed;Falls prevention discussed Falls evaluation completed;Falls prevention discussed    FALL RISK PREVENTION PERTAINING TO THE HOME:  Any stairs in or around the home? No  If so, are there any without handrails? N/a Home free of loose throw rugs in walkways, pet beds, electrical cords, etc? Yes  Adequate lighting in your home to reduce risk of falls? Yes   ASSISTIVE DEVICES UTILIZED TO PREVENT FALLS:  Life alert? No  Use of a cane, walker or w/c? No  Grab bars in the bathroom? Yes  Shower chair or bench in shower? Yes  Elevated toilet seat or a handicapped toilet? Yes   TIMED UP AND GO:  Was the test performed? No .      Cognitive Function:    03/20/2020   11:41 AM 02/19/2019    2:22 PM 02/09/2018    9:50 AM 02/08/2017   10:50 AM 01/14/2016    1:33 PM  MMSE - Mini Mental State Exam  Orientation to time '5 5 5 5 5  ' Orientation to Place '5 5 5 5 5  ' Registration '3 3 3 3 3  ' Attention/ Calculation 5 5 0 0 0  Recall '3 3 3 2 3  ' Language- name 2 objects  0 0 0 0  Language- repeat '1 1 1 1 1  ' Language- follow 3 step command  0 '3 3 3  ' Language-  read & follow direction  0 0 0 0  Write a sentence  0 0 0 0  Copy design  0 0 0 0  Total score  '22 20 19 20        ' 04/04/2022    8:31 AM  6CIT Screen  What Year? 0 points  What month? 0 points  What time? 0 points  Count back from 20 0 points  Months in reverse 0 points  Repeat phrase 2 points  Total Score 2 points    Immunizations Immunization History  Administered Date(s) Administered   Fluad Quad(high Dose 65+) 05/07/2019, 03/27/2020, 04/01/2021   Influenza Split 05/15/2013   Influenza, High Dose Seasonal PF 04/19/2016, 05/09/2017, 04/25/2018   Influenza,inj,quad, With Preservative 03/18/2021   Influenza-Unspecified 05/01/2014, 06/03/2015, 04/19/2016   PFIZER(Purple Top)SARS-COV-2 Vaccination 07/30/2019, 08/20/2019,  05/08/2020   Pneumococcal Conjugate-13 08/15/2012   Pneumococcal Polysaccharide-23 11/06/2014   Tdap 06/03/2015   Zoster Recombinat (Shingrix) 03/31/2020, 07/01/2020   Zoster, Live 03/06/2013    TDAP status: Up to date  Flu Vaccine status: Due, Education has been provided regarding the importance of this vaccine. Advised may receive this vaccine at local pharmacy or Health Dept. Aware to provide a copy of the vaccination record if obtained from local pharmacy or Health Dept. Verbalized acceptance and understanding.  Pneumococcal vaccine status: Up to date  Covid-19 vaccine status: Completed vaccines  Qualifies for Shingles Vaccine? Yes   Zostavax completed Yes   Shingrix Completed?: Yes  Screening Tests Health Maintenance  Topic Date Due   COVID-19 Vaccine (4 - Pfizer risk series) 07/03/2020   HEMOGLOBIN A1C  09/21/2021   INFLUENZA VACCINE  02/15/2022   Diabetic kidney evaluation - GFR measurement  03/24/2022   Diabetic kidney evaluation - Urine ACR  04/02/2022   FOOT EXAM  03/22/2022   COLONOSCOPY (Pts 45-78yr Insurance coverage will need to be confirmed)  11/23/2022   MAMMOGRAM  12/11/2022   OPHTHALMOLOGY EXAM  02/09/2023   TETANUS/TDAP  06/02/2025   Pneumonia Vaccine 76 Years old  Completed   DEXA SCAN  Completed   Hepatitis C Screening  Completed   Zoster Vaccines- Shingrix  Completed   HPV VACCINES  Aged Out    Health Maintenance  Health Maintenance Due  Topic Date Due   COVID-19 Vaccine (4 - Pfizer risk series) 07/03/2020   HEMOGLOBIN A1C  09/21/2021   INFLUENZA VACCINE  02/15/2022   Diabetic kidney evaluation - GFR measurement  03/24/2022   Diabetic kidney evaluation - Urine ACR  04/02/2022   FOOT EXAM  03/22/2022    Colorectal cancer screening: Type of screening: Colonoscopy. Completed 11/22/2021. Repeat every 1 years, has one scheduled for November  Mammogram status: Completed 12/10/2021. Repeat every year  Bone Density status: Completed 05/01/2017.    Lung Cancer Screening: (Low Dose CT Chest recommended if Age 10395-80years, 30 pack-year currently smoking OR have quit w/in 15years.) does not qualify.   Lung Cancer Screening Referral: no  Additional Screening:  Hepatitis C Screening: does qualify; Completed 01/14/2016  Vision Screening: Recommended annual ophthalmology exams for early detection of glaucoma and other disorders of the eye. Is the patient up to date with their annual eye exam?  Yes  Who is the provider or what is the name of the office in which the patient attends annual eye exams[facility] is not established with a provider, would they like to be referred to a provider to establish care? No .   Dental Screening: Recommended annual dental  exams for proper oral hygiene  Community Resource Referral / Chronic Care Management: CRR required this visit?  No   CCM required this visit?  No      Plan:     I have personally reviewed and noted the following in the patient's chart:   Medical and social history Use of alcohol, tobacco or illicit drugs  Current medications and supplements including opioid prescriptions. Patient is not currently taking opioid prescriptions. Functional ability and status Nutritional status Physical activity Advanced directives List of other physicians Hospitalizations, surgeries, and ER visits in previous 12 months Vitals Screenings to include cognitive, depression, and falls Referrals and appointments  In addition, I have reviewed and discussed with patient certain preventive protocols, quality metrics, and best practice recommendations. A written personalized care plan for preventive services as well as general preventive health recommendations were provided to patient.     Kellie Simmering, LPN   [id number]/[id number]   Nurse Notes: none  Due to this being a virtual visit, the after visit summary with patients personalized plan was offered to patient via mail or my-chart.  to pick  up at office at next visit

## 2022-04-04 NOTE — Patient Instructions (Signed)
Valerie Vargas , Thank you for taking time to come for your Medicare Wellness Visit. I appreciate your ongoing commitment to your health goals. Please review the following plan we discussed and let me know if I can assist you in the future.   Screening recommendations/referrals: Colonoscopy: scheduled for November Mammogram: completed 12/10/2021, due 12/12/2022 Bone Density: completed 05/01/2017 Recommended yearly ophthalmology/optometry visit for glaucoma screening and checkup Recommended yearly dental visit for hygiene and checkup  Vaccinations: Influenza vaccine: due Pneumococcal vaccine: completed 11/06/2014 Tdap vaccine: completed 11/16/20216, due 06/02/2025 Shingles vaccine: completed   Covid-19: 05/08/2020, 08/20/2019, 07/30/2019  Advanced directives: Please bring a copy of your POA (Power of Attorney) and/or Living Will to your next appointment.   Conditions/risks identified: none  Next appointment: Follow up in one year for your annual wellness visit    Preventive Care 65 Years and Older, Female Preventive care refers to lifestyle choices and visits with your health care provider that can promote health and wellness. What does preventive care include? A yearly physical exam. This is also called an annual well check. Dental exams once or twice a year. Routine eye exams. Ask your health care provider how often you should have your eyes checked. Personal lifestyle choices, including: Daily care of your teeth and gums. Regular physical activity. Eating a healthy diet. Avoiding tobacco and drug use. Limiting alcohol use. Practicing safe sex. Taking low-dose aspirin every day. Taking vitamin and mineral supplements as recommended by your health care provider. What happens during an annual well check? The services and screenings done by your health care provider during your annual well check will depend on your age, overall health, lifestyle risk factors, and family history of  disease. Counseling  Your health care provider may ask you questions about your: Alcohol use. Tobacco use. Drug use. Emotional well-being. Home and relationship well-being. Sexual activity. Eating habits. History of falls. Memory and ability to understand (cognition). Work and work Statistician. Reproductive health. Screening  You may have the following tests or measurements: Height, weight, and BMI. Blood pressure. Lipid and cholesterol levels. These may be checked every 5 years, or more frequently if you are over 53 years old. Skin check. Lung cancer screening. You may have this screening every year starting at age 46 if you have a 30-pack-year history of smoking and currently smoke or have quit within the past 15 years. Fecal occult blood test (FOBT) of the stool. You may have this test every year starting at age 35. Flexible sigmoidoscopy or colonoscopy. You may have a sigmoidoscopy every 5 years or a colonoscopy every 10 years starting at age 6. Hepatitis C blood test. Hepatitis B blood test. Sexually transmitted disease (STD) testing. Diabetes screening. This is done by checking your blood sugar (glucose) after you have not eaten for a while (fasting). You may have this done every 1-3 years. Bone density scan. This is done to screen for osteoporosis. You may have this done starting at age 58. Mammogram. This may be done every 1-2 years. Talk to your health care provider about how often you should have regular mammograms. Talk with your health care provider about your test results, treatment options, and if necessary, the need for more tests. Vaccines  Your health care provider may recommend certain vaccines, such as: Influenza vaccine. This is recommended every year. Tetanus, diphtheria, and acellular pertussis (Tdap, Td) vaccine. You may need a Td booster every 10 years. Zoster vaccine. You may need this after age 62. Pneumococcal 13-valent conjugate (PCV13) vaccine.  One  dose is recommended after age 6. Pneumococcal polysaccharide (PPSV23) vaccine. One dose is recommended after age 77. Talk to your health care provider about which screenings and vaccines you need and how often you need them. This information is not intended to replace advice given to you by your health care provider. Make sure you discuss any questions you have with your health care provider. Document Released: 07/31/2015 Document Revised: 03/23/2016 Document Reviewed: 05/05/2015 Elsevier Interactive Patient Education  2017 [place] Prevention in the Home Falls can cause injuries. They can happen to people of all ages. There are many things you can do to make your home safe and to help prevent falls. What can I do on the outside of my home? Regularly fix the edges of walkways and driveways and fix any cracks. Remove anything that might make you trip as you walk through a door, such as a raised step or threshold. Trim any bushes or trees on the path to your home. Use bright outdoor lighting. Clear any walking paths of anything that might make someone trip, such as rocks or tools. Regularly check to see if handrails are loose or broken. Make sure that both sides of any steps have handrails. Any raised decks and porches should have guardrails on the edges. Have any leaves, snow, or ice cleared regularly. Use sand or salt on walking paths during winter. Clean up any spills in your garage right away. This includes oil or grease spills. What can I do in the bathroom? Use night lights. Install grab bars by the toilet and in the tub and shower. Do not use towel bars as grab bars. Use non-skid mats or decals in the tub or shower. If you need to sit down in the shower, use a plastic, non-slip stool. Keep the floor dry. Clean up any water that spills on the floor as soon as it happens. Remove soap buildup in the tub or shower regularly. Attach bath mats securely with double-sided  non-slip rug tape. Do not have throw rugs and other things on the floor that can make you trip. What can I do in the bedroom? Use night lights. Make sure that you have a light by your bed that is easy to reach. Do not use any sheets or blankets that are too big for your bed. They should not hang down onto the floor. Have a firm chair that has side arms. You can use this for support while you get dressed. Do not have throw rugs and other things on the floor that can make you trip. What can I do in the kitchen? Clean up any spills right away. Avoid walking on wet floors. Keep items that you use a lot in easy-to-reach places. If you need to reach something above you, use a strong step stool that has a grab bar. Keep electrical cords out of the way. Do not use floor polish or wax that makes floors slippery. If you must use wax, use non-skid floor wax. Do not have throw rugs and other things on the floor that can make you trip. What can I do with my stairs? Do not leave any items on the stairs. Make sure that there are handrails on both sides of the stairs and use them. Fix handrails that are broken or loose. Make sure that handrails are as long as the stairways. Check any carpeting to make sure that it is firmly attached to the stairs. Fix any carpet that is loose or  worn. Avoid having throw rugs at the top or bottom of the stairs. If you do have throw rugs, attach them to the floor with carpet tape. Make sure that you have a light switch at the top of the stairs and the bottom of the stairs. If you do not have them, ask someone to add them for you. What else can I do to help prevent falls? Wear shoes that: Do not have high heels. Have rubber bottoms. Are comfortable and fit you well. Are closed at the toe. Do not wear sandals. If you use a stepladder: Make sure that it is fully opened. Do not climb a closed stepladder. Make sure that both sides of the stepladder are locked into place. Ask  someone to hold it for you, if possible. Clearly mark and make sure that you can see: Any grab bars or handrails. First and last steps. Where the edge of each step is. Use tools that help you move around (mobility aids) if they are needed. These include: Canes. Walkers. Scooters. Crutches. Turn on the lights when you go into a dark area. Replace any light bulbs as soon as they burn out. Set up your furniture so you have a clear path. Avoid moving your furniture around. If any of your floors are uneven, fix them. If there are any pets around you, be aware of where they are. Review your medicines with your doctor. Some medicines can make you feel dizzy. This can increase your chance of falling. Ask your doctor what other things that you can do to help prevent falls. This information is not intended to replace advice given to you by your health care provider. Make sure you discuss any questions you have with your health care provider. Document Released: 04/30/2009 Document Revised: 12/10/2015 Document Reviewed: 08/08/2014 Elsevier Interactive Patient Education  2017 [employer].

## 2022-04-05 ENCOUNTER — Encounter: Payer: Self-pay | Admitting: Family Medicine

## 2022-04-05 ENCOUNTER — Ambulatory Visit (INDEPENDENT_AMBULATORY_CARE_PROVIDER_SITE_OTHER): Payer: PPO | Admitting: Family Medicine

## 2022-04-05 VITALS — BP 140/80 | HR 74 | Temp 97.9°F | Ht 62.25 in | Wt 138.1 lb

## 2022-04-05 DIAGNOSIS — E785 Hyperlipidemia, unspecified: Secondary | ICD-10-CM

## 2022-04-05 DIAGNOSIS — E1159 Type 2 diabetes mellitus with other circulatory complications: Secondary | ICD-10-CM

## 2022-04-05 DIAGNOSIS — J383 Other diseases of vocal cords: Secondary | ICD-10-CM | POA: Diagnosis not present

## 2022-04-05 DIAGNOSIS — Z23 Encounter for immunization: Secondary | ICD-10-CM | POA: Diagnosis not present

## 2022-04-05 DIAGNOSIS — I152 Hypertension secondary to endocrine disorders: Secondary | ICD-10-CM | POA: Diagnosis not present

## 2022-04-05 DIAGNOSIS — E038 Other specified hypothyroidism: Secondary | ICD-10-CM

## 2022-04-05 DIAGNOSIS — E1169 Type 2 diabetes mellitus with other specified complication: Secondary | ICD-10-CM | POA: Diagnosis not present

## 2022-04-05 DIAGNOSIS — Z0000 Encounter for general adult medical examination without abnormal findings: Secondary | ICD-10-CM | POA: Diagnosis not present

## 2022-04-05 DIAGNOSIS — R5383 Other fatigue: Secondary | ICD-10-CM | POA: Diagnosis not present

## 2022-04-05 DIAGNOSIS — E2839 Other primary ovarian failure: Secondary | ICD-10-CM

## 2022-04-05 NOTE — Progress Notes (Signed)
Patient ID: Valerie Vargas, female    DOB: 09-08-1945, 76 y.o.   MRN: [id number]  This visit was conducted in person.  BP (!) 140/80   Pulse 74   Temp 97.9 F (36.6 C) (Oral)   Ht 5' 2.25" (1.581 m)   Wt 138 lb 2 oz (62.7 kg)   SpO2 96%   BMI 25.06 kg/m    CC:  Chief Complaint  Patient presents with   Annual Exam    Part 2    Subjective:   HPI: Valerie Vargas is a 76 y.o. female presenting on 04/05/2022 for Annual Exam (Part 2)  The patient presents for annual medicare wellness, complete physical and review of chronic health problems. He/She also has the following acute concerns today:  The patient saw a LPN or RN for medicare wellness visit.  Prevention and wellness was reviewed in detail. Note reviewed and important notes copied below.  Hypothyroidism: Due for reevaluation.  Followed by Endo.  Diabetes:  A1C 6 yesterday at ENDO... on no medication. Using medications without difficulties: Hypoglycemic episodes: none Hyperglycemic episodes: none Feet problems: no ulcers Blood Sugars averaging:  not checking regularly. eye exam within last year: yes  Hypertension:  Borderline control in office today on losartan 25 mg daily and metoprolol XL 50 mg p.o. daily BP Readings from Last 3 Encounters:  04/05/22 (!) 140/80  11/24/21 139/79  10/19/21 (!) 160/78  Using medication without problems or lightheadedness:  none Chest pain with exertion: none Edema:none Short of breath: none Average home BPs: Other issues: followed by cardiology... told to have BP higher than normal given syncope... only using losartan if BP spiking up.  Elevated Cholesterol: Due for reevaluation on Crestor 10 mg p.o. daily. Lab Results  Component Value Date   CHOL 139 03/24/2021   HDL 48.40 03/24/2021   LDLCALC 51 03/24/2021   LDLDIRECT 70.0 02/09/2018   TRIG 198.0 (H) 03/24/2021   CHOLHDL 3 03/24/2021  Using medications without problems: Muscle aches:  Diet compliance: issues as  below Exercise:  none Other complaints:    She is having issues with alternating constipation to diarrhea, copious gas. Bloating. She is feeling fatigued.  Having stool leakage. No testing well but no specific  food causes problems.  Has been seeing GI.. colonoscopy, barium follow thorough and rectal  sphincter test done... all normal.  She wonders about celiac testing... appears she had IgA transglutaminase, pancreastic elastase.  She think   Wt Readings from Last 3 Encounters:  04/05/22 138 lb 2 oz (62.7 kg)  04/04/22 135 lb (61.2 kg)  11/24/21 146 lb 9.6 oz (66.5 kg)    ENT prescribing gabapentin, alprazolam and benzonatate for spasmodic dysphonia: hoarse voice, throat clearing etc.  Fairly stable overall.. now released by ENT for me to prescribe these meds.     Relevant past medical, surgical, family and social history reviewed and updated as indicated. Interim medical history since our last visit reviewed. Allergies and medications reviewed and updated. Outpatient Medications Prior to Visit  Medication Sig Dispense Refill   ALPRAZolam (XANAX) 0.5 MG tablet TAKE 1 TABLET BY MOUTH EVERY DAY AS NEEDED 10 tablet 0   Ascorbic Acid (VITAMIN C) 1000 MG tablet Take 1,000 mg by mouth daily.     benzonatate (TESSALON) 100 MG capsule TAKE TWO CAPSULES BY MOUTH 3 TIMES A DAYAS NEEDED FOR COUGH 60 capsule 1   blood glucose meter kit and supplies KIT Dispense based on patient and insurance preference. Use up  to four times daily as directed. (FOR ICD-9 250.00, 250.01). 1 each 0   Cholecalciferol (VITAMIN D3) 1000 UNITS CAPS Take 1 capsule by mouth daily.     Cranberry 200 MG CAPS Take by mouth daily in the afternoon.     diphenhydrAMINE-PE-APAP 12.5-5-325 MG TABS Take 1 tablet by mouth at bedtime as needed.     gabapentin (NEURONTIN) 100 MG tablet Take 300 mg by mouth 3 (three) times daily.     glucose blood (ONE TOUCH ULTRA TEST) test strip -TEST TWICE DAILY 100 each 6   Guaifenesin 1200 MG  TB12 Take by mouth as needed.     levothyroxine (SYNTHROID, LEVOTHROID) 75 MCG tablet Take 75 mcg by mouth daily before breakfast. Brand Synthroid     loratadine (CLARITIN) 10 MG tablet Take 10 mg by mouth daily.     losartan (COZAAR) 25 MG tablet TAKE 1 TABLET BY MOUTH EVERY DAY AS NEEDED (FOR SYMPTOMATIC BLOOD PRESSURE SYSTOLIC OVER 244) 30 tablet 10   MAGNESIUM GLYCINATE PLUS PO Take 1 capsule by mouth daily.     metoprolol succinate (TOPROL-XL) 50 MG 24 hr tablet TAKE 1 TABLET BY MOUTH EVERY DAY 30 tablet 11   OMEGA-3 1000 MG CAPS Take 1,000 mg by mouth daily.     ONETOUCH DELICA LANCETS 62M MISC -TEST TWICE DAILY 100 each 5   pantoprazole (PROTONIX) 40 MG tablet Take 40 mg by mouth daily.     Pyridoxine HCl (VITAMIN B-6 PO) Take 1 tablet by mouth daily.     rosuvastatin (CRESTOR) 10 MG tablet TAKE 1 TABLET BY MOUTH EVERY DAY FOR CHOLESTEROL 90 tablet 0   Thiamine HCl (VITAMIN B-1) 250 MG tablet Take 250 mg by mouth daily.     vitamin B-12 (CYANOCOBALAMIN) 1000 MCG tablet Take 1,000 mcg by mouth daily.     guaiFENesin-codeine (ROBITUSSIN AC) 100-10 MG/5ML syrup Take 5-10 mLs by mouth at bedtime as needed for cough. (Patient not taking: Reported on 11/24/2021) 180 mL 0   OVER THE COUNTER MEDICATION BLADDER EASE (Patient not taking: Reported on 04/04/2022)     botulinum toxin Type A (BOTOX) 100 units SOLR injection For upcoming surgery (Patient not taking: Reported on 11/24/2021)     metFORMIN (GLUCOPHAGE) 500 MG tablet Take 500 mg by mouth 2 (two) times daily. (Patient not taking: Reported on 11/24/2021)     omeprazole (PRILOSEC) 40 MG capsule Take 40 mg by mouth daily.     tolterodine (DETROL LA) 4 MG 24 hr capsule Take 1 capsule (4 mg total) by mouth daily. (Patient not taking: Reported on 11/24/2021) 30 capsule 11   No facility-administered medications prior to visit.     Per HPI unless specifically indicated in ROS section below Review of Systems  Constitutional:  Positive for fatigue.  Negative for fever.  HENT:  Negative for congestion.   Eyes:  Negative for pain.  Respiratory:  Negative for cough and shortness of breath.   Cardiovascular:  Negative for chest pain, palpitations and leg swelling.  Gastrointestinal:  Positive for abdominal distention, abdominal pain, constipation and diarrhea. Negative for anal bleeding, blood in stool, nausea, rectal pain and vomiting.  Genitourinary:  Negative for dysuria and vaginal bleeding.  Musculoskeletal:  Negative for back pain.  Neurological:  Negative for syncope, light-headedness and headaches.  Psychiatric/Behavioral:  Negative for dysphoric mood.    Objective:  BP (!) 140/80   Pulse 74   Temp 97.9 F (36.6 C) (Oral)   Ht 5' 2.25" (1.581 m)  Wt 138 lb 2 oz (62.7 kg)   SpO2 96%   BMI 25.06 kg/m   Wt Readings from Last 3 Encounters:  04/05/22 138 lb 2 oz (62.7 kg)  04/04/22 135 lb (61.2 kg)  11/24/21 146 lb 9.6 oz (66.5 kg)      Physical Exam Constitutional:      General: She is not in acute distress.    Appearance: Normal appearance. She is well-developed. She is not ill-appearing or toxic-appearing.  HENT:     Head: Normocephalic.     Right Ear: Hearing, tympanic membrane, ear canal and external ear normal. Tympanic membrane is not erythematous, retracted or bulging.     Left Ear: Hearing, tympanic membrane, ear canal and external ear normal. Tympanic membrane is not erythematous, retracted or bulging.     Nose: No mucosal edema or rhinorrhea.     Right Sinus: No maxillary sinus tenderness or frontal sinus tenderness.     Left Sinus: No maxillary sinus tenderness or frontal sinus tenderness.     Mouth/Throat:     Pharynx: Uvula midline.  Eyes:     General: Lids are normal. Lids are everted, no foreign bodies appreciated.     Conjunctiva/sclera: Conjunctivae normal.     Pupils: Pupils are equal, round, and reactive to light.  Neck:     Thyroid: No thyroid mass or thyromegaly.     Vascular: No carotid bruit.      Trachea: Trachea normal.  Cardiovascular:     Rate and Rhythm: Normal rate and regular rhythm.     Pulses: Normal pulses.     Heart sounds: Normal heart sounds, S1 normal and S2 normal. No murmur heard.    No friction rub. No gallop.  Pulmonary:     Effort: Pulmonary effort is normal. No tachypnea or respiratory distress.     Breath sounds: Normal breath sounds. No decreased breath sounds, wheezing, rhonchi or rales.  Abdominal:     General: Bowel sounds are normal.     Palpations: Abdomen is soft.     Tenderness: There is no abdominal tenderness.  Musculoskeletal:     Cervical back: Normal range of motion and neck supple.  Skin:    General: Skin is warm and dry.     Findings: No rash.  Neurological:     Mental Status: She is alert.  Psychiatric:        Mood and Affect: Mood is not anxious or depressed.        Speech: Speech normal.        Behavior: Behavior normal. Behavior is cooperative.        Thought Content: Thought content normal.        Judgment: Judgment normal.     Diabetic foot exam: Normal inspection No skin breakdown No calluses  Normal DP pulses Normal sensation to light touch and monofilament Nails normal     Results for orders placed or performed in visit on 02/08/22  HM DIABETES EYE EXAM  Result Value Ref Range   HM Diabetic Eye Exam No Retinopathy No Retinopathy     COVID 19 screen:  No recent travel or known exposure to [place] The patient denies respiratory symptoms of COVID 19 at this time. The importance of social distancing was discussed today.   Assessment and Plan The patient's preventative maintenance and recommended screening tests for an annual wellness exam were reviewed in full today. Brought up to date unless services declined.  Counselled on the importance of diet, exercise, and  its role in overall health and mortality. The patient's FH and SH was reviewed, including their home life, tobacco status, and drug and alcohol  status.     Colon: 11/2021 Nonsmoker DEXA:  Normal 04/2017, can repeat in 5 years.  .. Due after 04/2022 Mammogram last 11/2021 PAP not indicated, DVE not indicated total hysterecotmy Vaccine: uptodate , COVID19  series x3 , given high dose flu shot today. Hep C neg   Problem List Items Addressed This Visit     Hyperlipidemia associated with type 2 diabetes mellitus ([place])    Due for re-eval      Relevant Orders   Lipid panel   Comprehensive metabolic panel   Hypertension associated with diabetes ([facility])    Stable, chronic.  Continue current medication.   Losartan 25 mg p.o. daily Metoprolol XL 50 mg p.o. daily      Hypothyroidism    Due for reevaluation      Relevant Orders   TSH   T4, free   T3, free   Type 2 diabetes mellitus with other circulatory complications HTN ([place])    Due for reevaluation      Vocal cord dysfunction     Stable on gabapentin and benzonatate... using alprazolam rarely.  ENT has release her given she is stable... I will prescribe here now.      Other Visit Diagnoses     Routine general medical examination at a health care facility    -  Primary   Estrogen deficiency       Relevant Orders   DG Bone Density   Need for influenza vaccination       Relevant Orders   Flu Vaccine QUAD High Dose(Fluad) (Completed)   Fatigue, unspecified type       Relevant Orders   IBC + Ferritin   CBC with Differential/Platelet   VITAMIN D 25 Hydroxy (Vit-D Deficiency, Fractures)         Eliezer Lofts, MD

## 2022-04-05 NOTE — Assessment & Plan Note (Signed)
Due for reevaluation 

## 2022-04-05 NOTE — Assessment & Plan Note (Signed)
Due for re-eval. 

## 2022-04-05 NOTE — Assessment & Plan Note (Signed)
Stable on gabapentin and benzonatate... using alprazolam rarely.  ENT has release her given she is stable... I will prescribe here now.

## 2022-04-05 NOTE — Assessment & Plan Note (Signed)
Stable, chronic.  Continue current medication.   Losartan 25 mg p.o. daily Metoprolol XL 50 mg p.o. daily

## 2022-04-08 ENCOUNTER — Other Ambulatory Visit (INDEPENDENT_AMBULATORY_CARE_PROVIDER_SITE_OTHER): Payer: PPO

## 2022-04-08 ENCOUNTER — Telehealth: Payer: Self-pay | Admitting: Family Medicine

## 2022-04-08 ENCOUNTER — Encounter: Payer: Self-pay | Admitting: Family Medicine

## 2022-04-08 DIAGNOSIS — E1169 Type 2 diabetes mellitus with other specified complication: Secondary | ICD-10-CM | POA: Diagnosis not present

## 2022-04-08 DIAGNOSIS — Z8679 Personal history of other diseases of the circulatory system: Secondary | ICD-10-CM | POA: Diagnosis not present

## 2022-04-08 DIAGNOSIS — E785 Hyperlipidemia, unspecified: Secondary | ICD-10-CM | POA: Diagnosis not present

## 2022-04-08 DIAGNOSIS — E038 Other specified hypothyroidism: Secondary | ICD-10-CM

## 2022-04-08 DIAGNOSIS — M542 Cervicalgia: Secondary | ICD-10-CM | POA: Diagnosis not present

## 2022-04-08 DIAGNOSIS — M47812 Spondylosis without myelopathy or radiculopathy, cervical region: Secondary | ICD-10-CM | POA: Diagnosis not present

## 2022-04-08 DIAGNOSIS — R5383 Other fatigue: Secondary | ICD-10-CM

## 2022-04-08 LAB — COMPREHENSIVE METABOLIC PANEL
ALT: 13 U/L (ref 0–35)
AST: 15 U/L (ref 0–37)
Albumin: 4.2 g/dL (ref 3.5–5.2)
Alkaline Phosphatase: 38 U/L — ABNORMAL LOW (ref 39–117)
BUN: 16 mg/dL (ref 6–23)
CO2: 29 mEq/L (ref 19–32)
Calcium: 9.5 mg/dL (ref 8.4–10.5)
Chloride: 102 mEq/L (ref 96–112)
Creatinine, Ser: 0.95 mg/dL (ref 0.40–1.20)
GFR: 57.67 mL/min — ABNORMAL LOW (ref >60.00)
Glucose, Bld: 102 mg/dL — ABNORMAL HIGH (ref 70–99)
Potassium: 5 mEq/L (ref 3.5–5.1)
Sodium: 139 mEq/L (ref 135–145)
Total Bilirubin: 0.4 mg/dL (ref 0.2–1.2)
Total Protein: 6.8 g/dL (ref 6.0–8.3)

## 2022-04-08 LAB — IBC + FERRITIN
Ferritin: 21.3 ng/mL (ref 10.0–291.0)
Iron: 70 ug/dL (ref 42–145)
Saturation Ratios: 18.6 % — ABNORMAL LOW (ref 20.0–50.0)
TIBC: 376.6 ug/dL (ref 250.0–450.0)
Transferrin: 269 mg/dL (ref 212.0–360.0)

## 2022-04-08 LAB — CBC WITH DIFFERENTIAL/PLATELET
Basophils Absolute: 0.1 10*3/uL (ref 0.0–0.1)
Basophils Relative: 0.8 % (ref 0.0–3.0)
Eosinophils Absolute: 0.1 10*3/uL (ref 0.0–0.7)
Eosinophils Relative: 1.2 % (ref 0.0–5.0)
HCT: 38.3 % (ref 36.0–46.0)
Hemoglobin: 12.6 g/dL (ref 12.0–15.0)
Lymphocytes Relative: 27.4 % (ref 12.0–46.0)
Lymphs Abs: 1.8 10*3/uL (ref 0.7–4.0)
MCHC: 33 g/dL (ref 30.0–36.0)
MCV: 90.6 fl (ref 78.0–100.0)
Monocytes Absolute: 0.5 10*3/uL (ref 0.1–1.0)
Monocytes Relative: 7.4 % (ref 3.0–12.0)
Neutro Abs: 4 10*3/uL (ref 1.4–7.7)
Neutrophils Relative %: 63.2 % (ref 43.0–77.0)
Platelets: 220 10*3/uL (ref 150.0–400.0)
RBC: 4.23 Mil/uL (ref 3.87–5.11)
RDW: 13.5 % (ref 11.5–15.5)
WBC: 6.4 10*3/uL (ref 4.0–10.5)

## 2022-04-08 LAB — T4, FREE: Free T4: 0.92 ng/dL (ref 0.60–1.60)

## 2022-04-08 LAB — T3, FREE: T3, Free: 2.7 pg/mL (ref 2.3–4.2)

## 2022-04-08 LAB — LIPID PANEL
Cholesterol: 185 mg/dL (ref 0–200)
HDL: 65.4 mg/dL (ref >39.00)
LDL Cholesterol: 99 mg/dL (ref 0–99)
NonHDL: 119.65
Total CHOL/HDL Ratio: 3
Triglycerides: 105 mg/dL (ref 0.0–149.0)
VLDL: 21 mg/dL (ref 0.0–40.0)

## 2022-04-08 LAB — TSH: TSH: 1.15 u[IU]/mL (ref 0.35–5.50)

## 2022-04-08 LAB — VITAMIN D 25 HYDROXY (VIT D DEFICIENCY, FRACTURES): VITD: 34.86 ng/mL (ref 30.00–100.00)

## 2022-04-08 NOTE — Telephone Encounter (Signed)
Pt dropped off a report from her gastroenterologist, pt stated Dr. Diona Browner requested the report after her last visit. Forms will be in pcp's folder.

## 2022-04-08 NOTE — Telephone Encounter (Signed)
Placed in Dr. Rometta Emery office in box to review.

## 2022-04-08 NOTE — Progress Notes (Signed)
No critical labs need to be addressed urgently. We will discuss labs in detail at upcoming office visit.   

## 2022-04-12 ENCOUNTER — Other Ambulatory Visit: Payer: Self-pay | Admitting: Family Medicine

## 2022-04-12 DIAGNOSIS — Z1231 Encounter for screening mammogram for malignant neoplasm of breast: Secondary | ICD-10-CM

## 2022-04-18 ENCOUNTER — Telehealth: Payer: Self-pay | Admitting: Family Medicine

## 2022-04-18 NOTE — Telephone Encounter (Signed)
Pt called stating she is getting rsv & covid shots from pharmacy but was told by pharmacy to get a prescription from pcp sent in in order to receive shots. Pt is asking for prescription to be sent in to pharmacy. call back # is [phone]

## 2022-04-19 MED ORDER — SPIKEVAX COVID-19 VACCINE 100 MCG/0.5ML IM SUSP: 0.5000 mL | Freq: Once | INTRAMUSCULAR | 0 refills | Status: AC

## 2022-04-19 MED ORDER — RSVPREF3 VAC RECOMB ADJUVANTED 120 MCG/0.5ML IM SUSR: 0.5000 mL | Freq: Once | INTRAMUSCULAR | 0 refills | Status: AC

## 2022-04-19 NOTE — Telephone Encounter (Signed)
Valerie Vargas notified by telephone that prescriptions for RSV and Spikevax has been sent to her pharmacy.

## 2022-04-19 NOTE — Telephone Encounter (Signed)
Okay.. to send in prescription via [facility].  Le me know if I need to hand write.

## 2022-04-25 DIAGNOSIS — L910 Hypertrophic scar: Secondary | ICD-10-CM | POA: Diagnosis not present

## 2022-04-25 DIAGNOSIS — H02132 Senile ectropion of right lower eyelid: Secondary | ICD-10-CM | POA: Diagnosis not present

## 2022-04-25 DIAGNOSIS — H019 Unspecified inflammation of eyelid: Secondary | ICD-10-CM | POA: Diagnosis not present

## 2022-04-25 DIAGNOSIS — H02423 Myogenic ptosis of bilateral eyelids: Secondary | ICD-10-CM | POA: Diagnosis not present

## 2022-04-25 DIAGNOSIS — L658 Other specified nonscarring hair loss: Secondary | ICD-10-CM | POA: Diagnosis not present

## 2022-04-25 DIAGNOSIS — H02131 Senile ectropion of right upper eyelid: Secondary | ICD-10-CM | POA: Diagnosis not present

## 2022-05-10 ENCOUNTER — Other Ambulatory Visit: Payer: Self-pay | Admitting: Family Medicine

## 2022-05-25 ENCOUNTER — Encounter: Payer: Self-pay | Admitting: Family Medicine

## 2022-05-25 DIAGNOSIS — K635 Polyp of colon: Secondary | ICD-10-CM | POA: Diagnosis not present

## 2022-05-25 DIAGNOSIS — D120 Benign neoplasm of cecum: Secondary | ICD-10-CM | POA: Diagnosis not present

## 2022-05-25 DIAGNOSIS — E119 Type 2 diabetes mellitus without complications: Secondary | ICD-10-CM | POA: Diagnosis not present

## 2022-05-25 LAB — HM COLONOSCOPY

## 2022-06-23 DIAGNOSIS — L814 Other melanin hyperpigmentation: Secondary | ICD-10-CM | POA: Diagnosis not present

## 2022-06-23 DIAGNOSIS — L820 Inflamed seborrheic keratosis: Secondary | ICD-10-CM | POA: Diagnosis not present

## 2022-06-24 DIAGNOSIS — B9689 Other specified bacterial agents as the cause of diseases classified elsewhere: Secondary | ICD-10-CM | POA: Diagnosis not present

## 2022-06-24 DIAGNOSIS — K5900 Constipation, unspecified: Secondary | ICD-10-CM | POA: Diagnosis not present

## 2022-06-24 DIAGNOSIS — R140 Abdominal distension (gaseous): Secondary | ICD-10-CM | POA: Diagnosis not present

## 2022-06-24 DIAGNOSIS — R1084 Generalized abdominal pain: Secondary | ICD-10-CM | POA: Diagnosis not present

## 2022-06-24 DIAGNOSIS — R197 Diarrhea, unspecified: Secondary | ICD-10-CM | POA: Diagnosis not present

## 2022-06-24 DIAGNOSIS — K639 Disease of intestine, unspecified: Secondary | ICD-10-CM | POA: Diagnosis not present

## 2022-07-08 DIAGNOSIS — J0100 Acute maxillary sinusitis, unspecified: Secondary | ICD-10-CM | POA: Diagnosis not present

## 2022-07-08 DIAGNOSIS — R0981 Nasal congestion: Secondary | ICD-10-CM | POA: Diagnosis not present

## 2022-07-08 DIAGNOSIS — R059 Cough, unspecified: Secondary | ICD-10-CM | POA: Diagnosis not present

## 2022-08-01 DIAGNOSIS — L820 Inflamed seborrheic keratosis: Secondary | ICD-10-CM | POA: Diagnosis not present

## 2022-08-01 DIAGNOSIS — L650 Telogen effluvium: Secondary | ICD-10-CM | POA: Diagnosis not present

## 2022-09-05 DIAGNOSIS — H52203 Unspecified astigmatism, bilateral: Secondary | ICD-10-CM | POA: Diagnosis not present

## 2022-09-05 LAB — HM DIABETES EYE EXAM

## 2022-09-13 DIAGNOSIS — K080 Exfoliation of teeth due to systemic causes: Secondary | ICD-10-CM | POA: Diagnosis not present

## 2022-09-17 IMAGING — MG MM DIGITAL SCREENING BILAT W/ TOMO AND CAD
8 series · 8 of 24 positions shown · non-contrast
Comparison: Previous exam(s).

CLINICAL DATA: Screening.

EXAM:
DIGITAL SCREENING BILATERAL MAMMOGRAM WITH TOMOSYNTHESIS AND CAD
TECHNIQUE: Bilateral screening digital craniocaudal and mediolateral oblique
mammograms were obtained. Bilateral screening digital breast
tomosynthesis was performed. The images were evaluated with
computer-aided detection.

[R CC synth-2D]
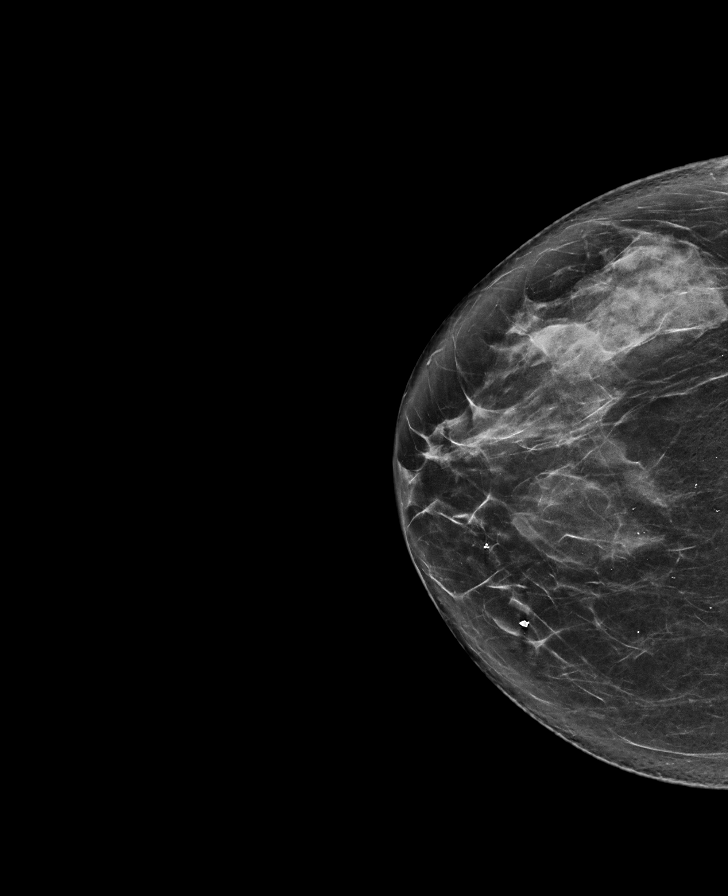

[L CC synth-2D]
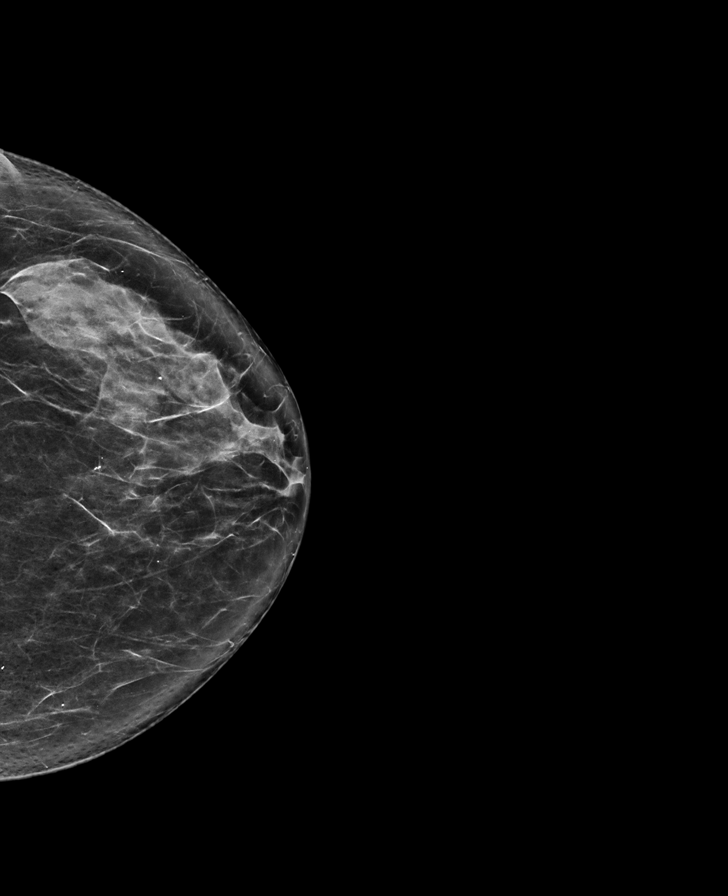

[L MLO synth-2D]
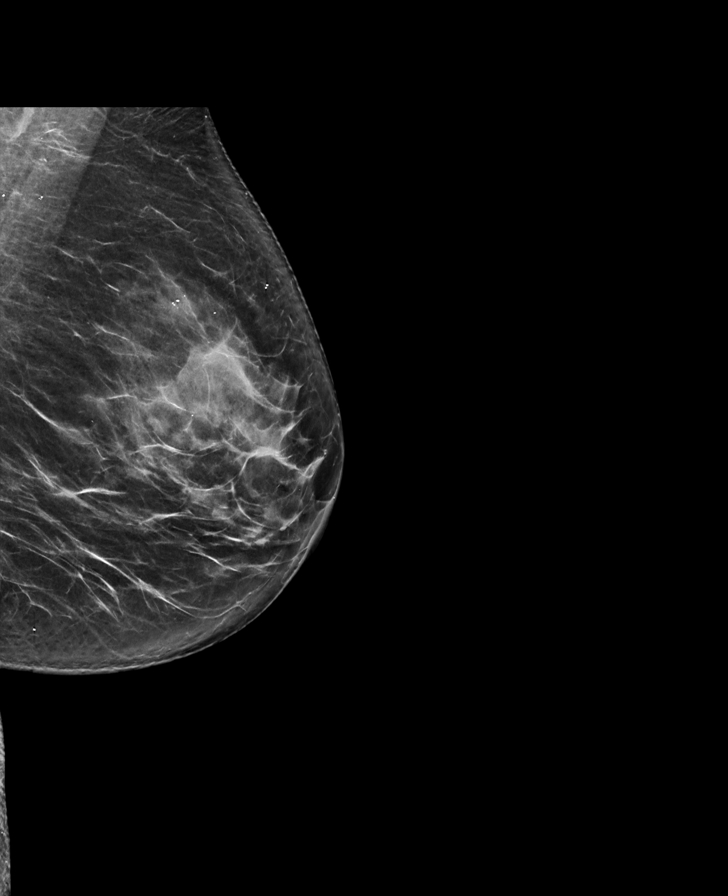

[R MLO synth-2D]
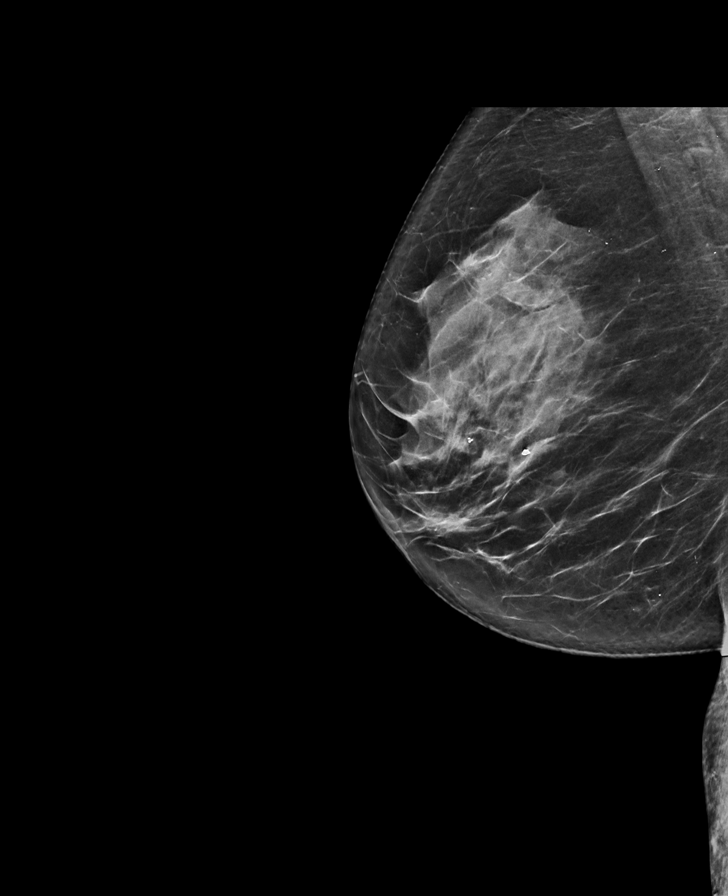

[R MLO tomo · tomo slice 39/76.0]
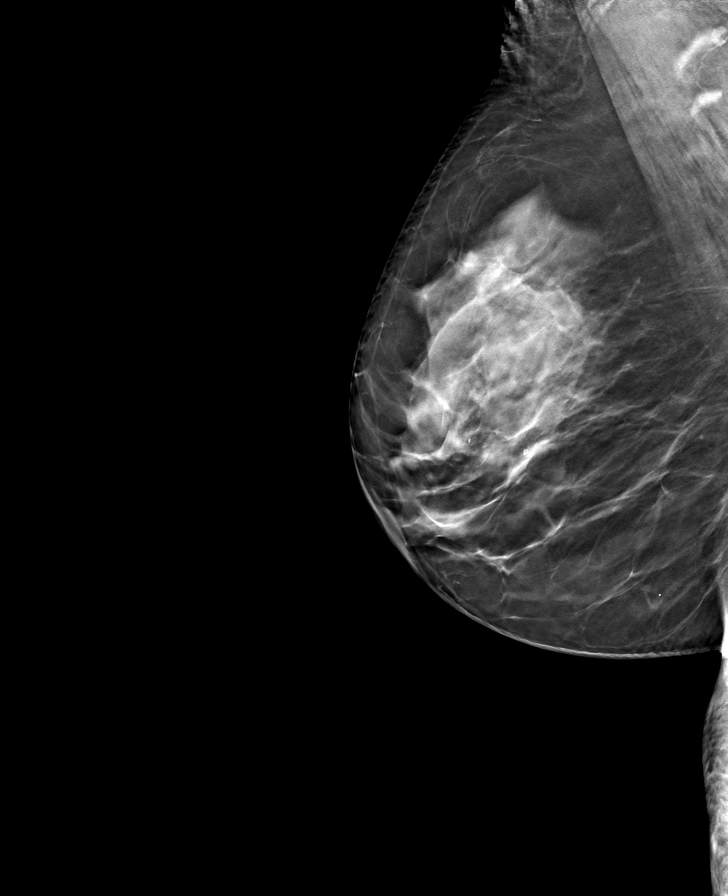

[R CC tomo · tomo slice 38/75.0]
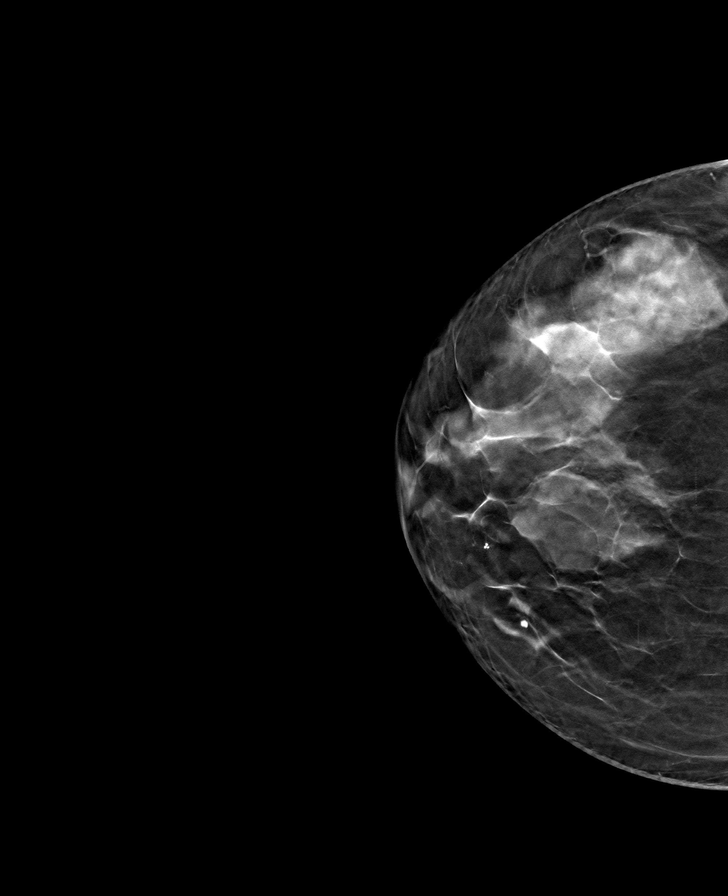

[L CC tomo · tomo slice 37/73.0]
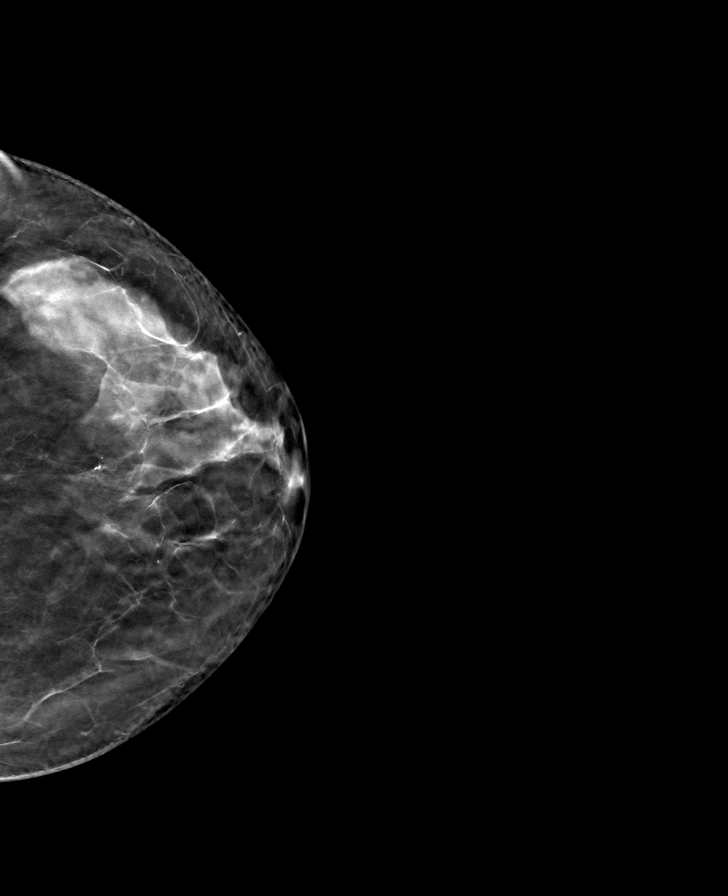

[L MLO tomo · tomo slice 38/75.0]
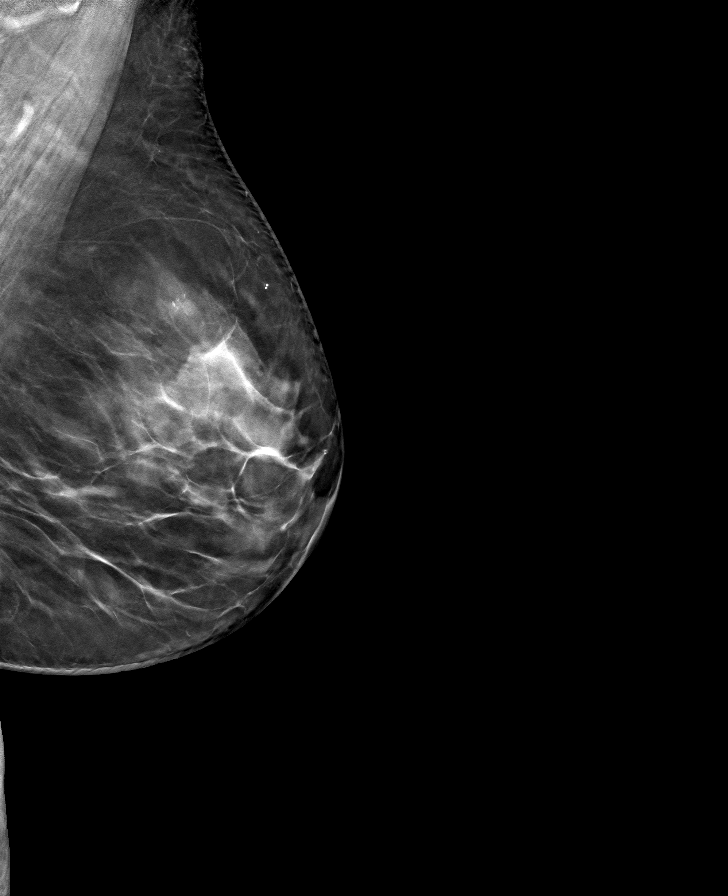

[8 of 24 positions shown; findings below may reference images not displayed]

ACR Breast Density Category c: The breast tissue is heterogeneously
dense, which may obscure small masses.
FINDINGS: There are no findings suspicious for malignancy. The images were
evaluated with computer-aided detection.
IMPRESSION: No mammographic evidence of malignancy. A result letter of this
screening mammogram will be mailed directly to the patient.

RECOMMENDATION:
Screening mammogram in one year. (Code:T4-5-GWO)

BI-RADS CATEGORY  1: Negative.

## 2022-10-06 ENCOUNTER — Other Ambulatory Visit: Payer: Self-pay | Admitting: Family Medicine

## 2022-10-06 NOTE — Telephone Encounter (Signed)
Last office visit 04/05/2022 for CPE.  Last refilled ?  Listed as historical med. Next appt: CPE 04/14/2023.

## 2022-10-06 NOTE — Telephone Encounter (Signed)
Spoke with Valerie Vargas.  She states Dr. Carol Ada, her ENT at [facility] prescribed this for her due to her throat doesn't close properly which can send her into coughing fits.  They control this with Gabapentin and Tessalon Perles.  She states she discussed with Dr. Diona Browner at her last CPE about taking over prescribing this for her due to there has been no changes with this after the last few years and her ENT told her if Dr. Diona Browner would take over prescribing this for her, then she wouldn't have go back to ENT.  Refill request is the correct way patient is currently taking it.

## 2022-10-31 DIAGNOSIS — L650 Telogen effluvium: Secondary | ICD-10-CM | POA: Diagnosis not present

## 2022-11-22 ENCOUNTER — Telehealth: Payer: Self-pay | Admitting: Family Medicine

## 2022-11-22 NOTE — Telephone Encounter (Signed)
Contacted Valerie Vargas to schedule their annual wellness visit. Appointment made for 12/28/2022.  [facility] TEAM Direct Dial: [phone]

## 2022-12-13 ENCOUNTER — Ambulatory Visit
Admission: RE | Admit: 2022-12-13 | Discharge: 2022-12-13 | Disposition: A | Discharge status: Home / Self Care | Payer: Medicare Other | Source: Ambulatory Visit | Attending: Family Medicine | Admitting: Family Medicine

## 2022-12-13 DIAGNOSIS — Z1231 Encounter for screening mammogram for malignant neoplasm of breast: Secondary | ICD-10-CM

## 2022-12-13 DIAGNOSIS — E2839 Other primary ovarian failure: Secondary | ICD-10-CM

## 2022-12-13 DIAGNOSIS — M810 Age-related osteoporosis without current pathological fracture: Secondary | ICD-10-CM | POA: Diagnosis not present

## 2022-12-13 IMAGING — CR DG HIP (WITH OR WITHOUT PELVIS) 2-3V*R*
2 series · 2 of 2 positions shown · non-contrast
Comparison: None.

CLINICAL DATA: Fall with pain

EXAM:
DG HIP (WITH OR WITHOUT PELVIS) 2-3V RIGHT

[t pelvis ap]
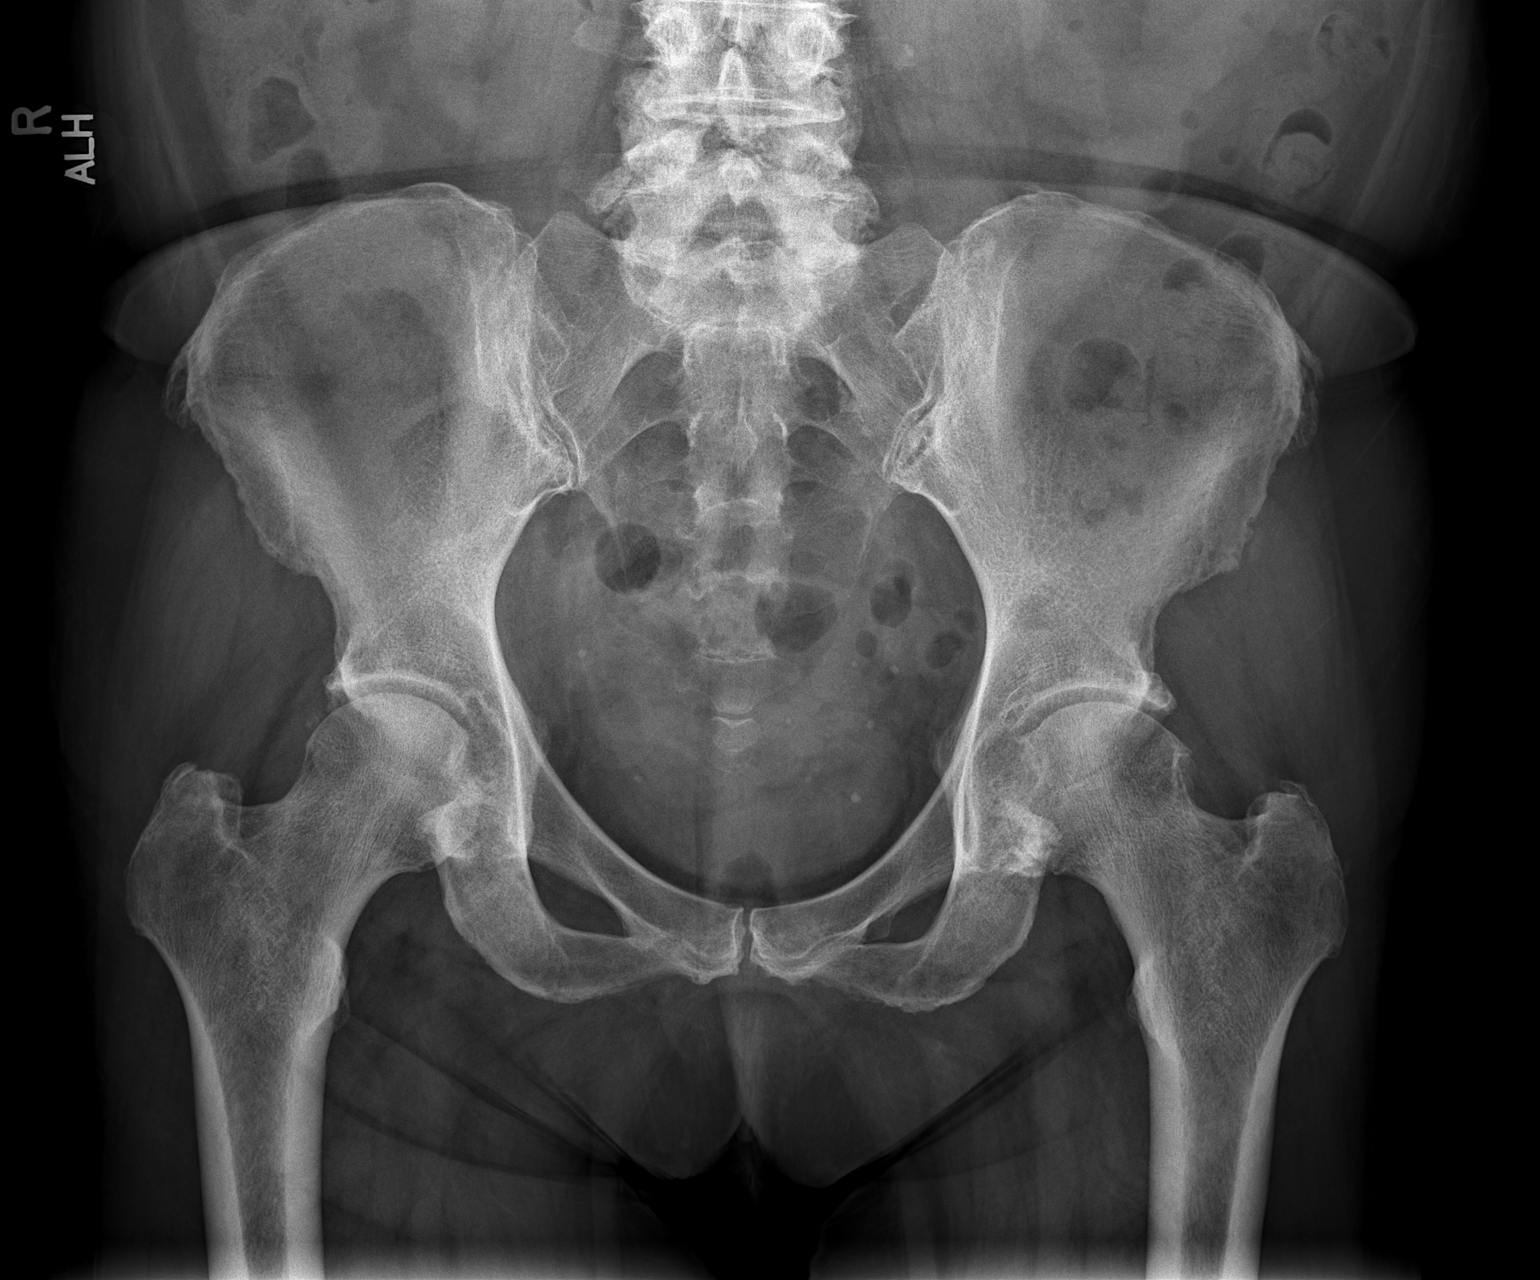

[t hip frog right]
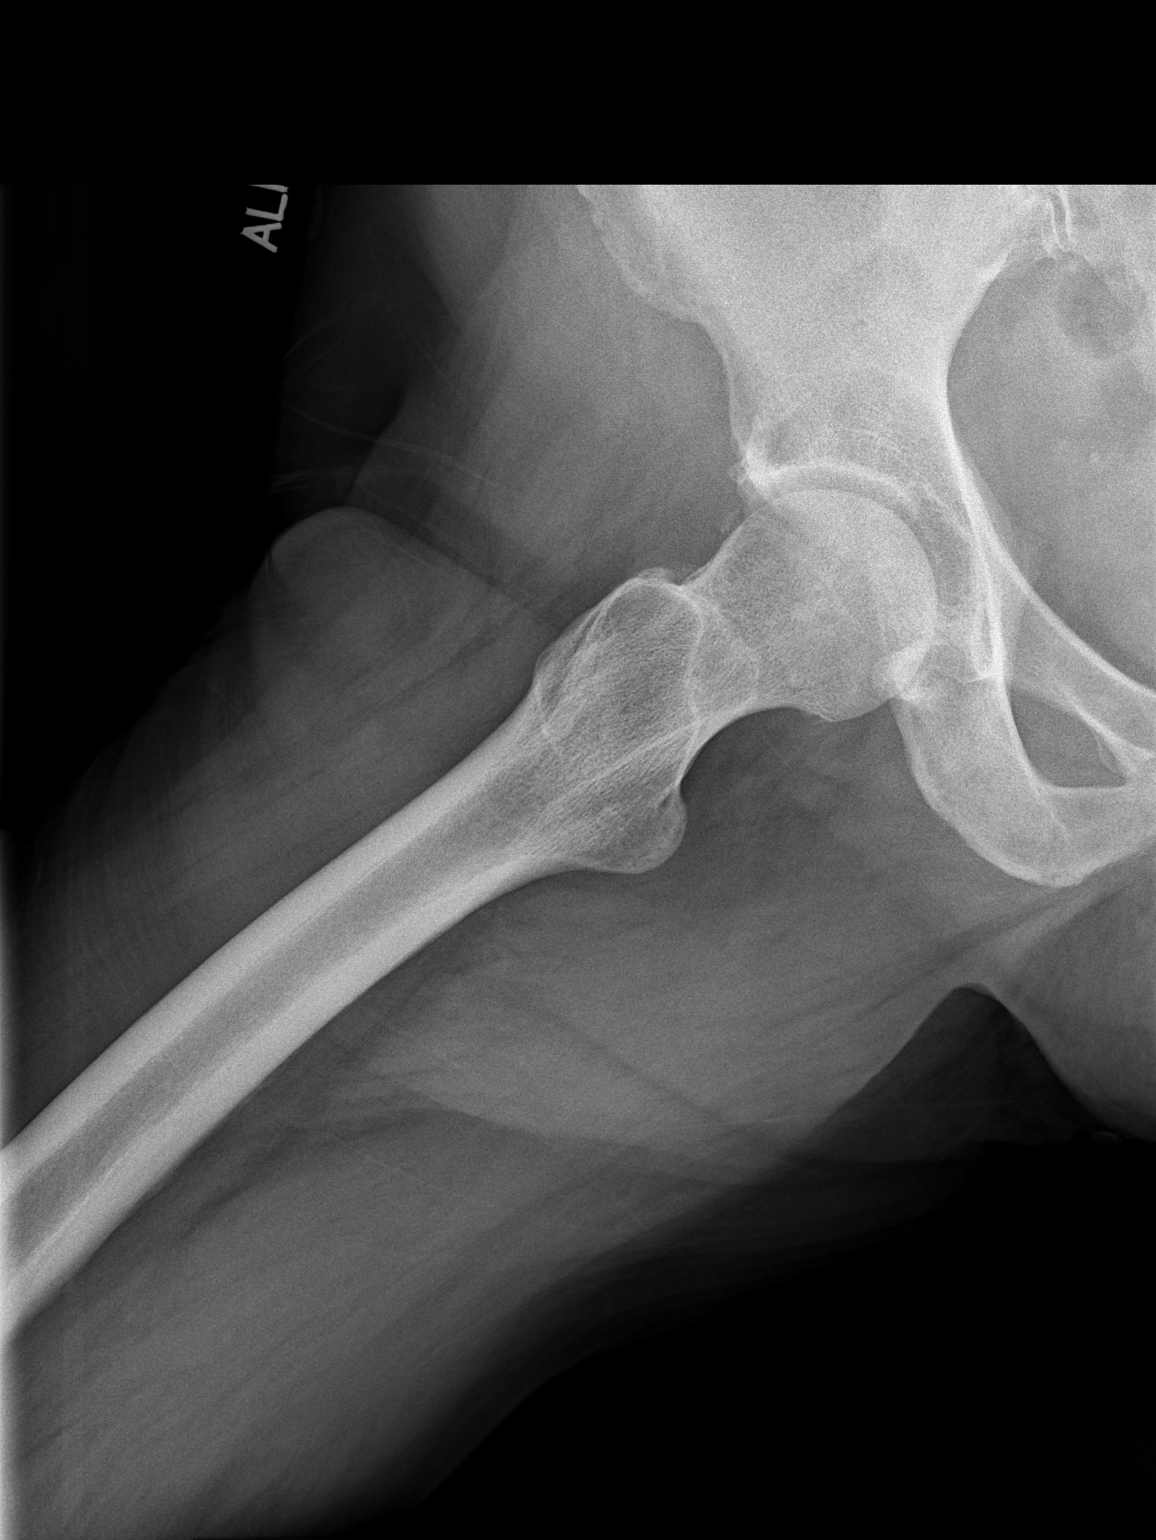

[2 of 2 positions shown; findings below may reference images not displayed]

FINDINGS: No fracture or malalignment. Mild degenerative change of both hips.
SI joints are non widened. Pubic symphysis and rami are intact
IMPRESSION: No acute osseous abnormality

## 2022-12-14 ENCOUNTER — Telehealth: Payer: Self-pay | Admitting: Cardiovascular Disease

## 2022-12-14 NOTE — Telephone Encounter (Signed)
Patient called stating she needs a new mask and straps that hold the mask ordered.

## 2022-12-15 ENCOUNTER — Other Ambulatory Visit: Payer: Self-pay | Admitting: Family Medicine

## 2022-12-15 DIAGNOSIS — R928 Other abnormal and inconclusive findings on diagnostic imaging of breast: Secondary | ICD-10-CM

## 2022-12-16 ENCOUNTER — Encounter: Payer: Self-pay | Admitting: Cardiovascular Disease

## 2022-12-27 ENCOUNTER — Ambulatory Visit
Admission: RE | Admit: 2022-12-27 | Discharge: 2022-12-27 | Disposition: A | Discharge status: Home / Self Care | Payer: PPO | Source: Ambulatory Visit | Attending: Family Medicine | Admitting: Family Medicine

## 2022-12-27 ENCOUNTER — Ambulatory Visit: Admission: RE | Admit: 2022-12-27 | Payer: PPO | Source: Ambulatory Visit

## 2022-12-27 DIAGNOSIS — R921 Mammographic calcification found on diagnostic imaging of breast: Secondary | ICD-10-CM | POA: Diagnosis not present

## 2022-12-27 DIAGNOSIS — R922 Inconclusive mammogram: Secondary | ICD-10-CM | POA: Diagnosis not present

## 2022-12-27 DIAGNOSIS — R928 Other abnormal and inconclusive findings on diagnostic imaging of breast: Secondary | ICD-10-CM

## 2022-12-28 ENCOUNTER — Ambulatory Visit (INDEPENDENT_AMBULATORY_CARE_PROVIDER_SITE_OTHER): Payer: Medicare Other

## 2022-12-28 ENCOUNTER — Other Ambulatory Visit: Payer: Self-pay | Admitting: Family Medicine

## 2022-12-28 VITALS — Ht 62.5 in | Wt 138.0 lb

## 2022-12-28 DIAGNOSIS — R921 Mammographic calcification found on diagnostic imaging of breast: Secondary | ICD-10-CM

## 2022-12-28 DIAGNOSIS — Z0000 Encounter for general adult medical examination without abnormal findings: Secondary | ICD-10-CM

## 2022-12-28 NOTE — Progress Notes (Signed)
I connected with  Valerie Vargas on 12/28/22 by a audio enabled telemedicine application and verified that I am speaking with the correct person using two identifiers.  Patient Location: Home  Provider Location: Home Office  I discussed the limitations of evaluation and management by telemedicine. The patient expressed understanding and agreed to proceed.  Subjective:   Valerie Vargas is a 77 y.o. female who presents for Medicare Annual (Subsequent) preventive examination.  Review of Systems      Cardiac Risk Factors include: advanced age (>72men, >53 women);hypertension;diabetes mellitus;sedentary lifestyle     Objective:    Today's Vitals   12/28/22 1303  Weight: 138 lb (62.6 kg)  Height: 5' 2.5" (1.588 m)   Body mass index is 24.84 kg/m.     12/28/2022    1:17 PM 04/04/2022    8:27 AM 03/20/2020   11:29 AM 02/19/2019    2:14 PM 07/01/2018    8:45 PM 05/01/2018    9:18 PM 02/09/2018    9:49 AM  Advanced Directives  Does Patient Have a Medical Advance Directive? Yes Yes Yes Yes Yes Yes Yes  Type of Estate agent of [place];Living will Healthcare Power of [place];Living will Healthcare Power of [place];Living will Healthcare Power of [place];Living will Healthcare Power of [place];Living will Healthcare Power of [place];Living will Healthcare Power of [place];Living will  Does patient want to make changes to medical advance directive?     No - Patient declined No - Patient declined   Copy of Healthcare Power of Attorney in Chart? No - copy requested No - copy requested No - copy requested No - copy requested  No - copy requested No - copy requested  Would patient like information on creating a medical advance directive?     No - Patient declined      Current Medications (verified) Outpatient Encounter Medications as of 12/28/2022  Medication Sig   ALPRAZolam (XANAX) 0.5 MG tablet TAKE 1 TABLET BY MOUTH EVERY DAY AS NEEDED   Ascorbic Acid (VITAMIN  C) 1000 MG tablet Take 1,000 mg by mouth daily.   benzonatate (TESSALON) 100 MG capsule TAKE TWO CAPSULES BY MOUTH 3 TIMES A DAYAS NEEDED FOR COUGH   blood glucose meter kit and supplies KIT Dispense based on patient and insurance preference. Use up to four times daily as directed. (FOR ICD-9 250.00, 250.01).   Cholecalciferol (VITAMIN D3) 1000 UNITS CAPS Take 1 capsule by mouth daily.   Cranberry 200 MG CAPS Take by mouth daily in the afternoon.   diphenhydrAMINE-PE-APAP 12.5-5-325 MG TABS Take 1 tablet by mouth at bedtime as needed.   gabapentin (NEURONTIN) 100 MG capsule TAKE 3 CAPSULES BY MOUTH 3 TIMES DAILY   gabapentin (NEURONTIN) 100 MG tablet Take 300 mg by mouth 3 (three) times daily.   glucose blood (ONE TOUCH ULTRA TEST) test strip -TEST TWICE DAILY   Guaifenesin 1200 MG TB12 Take by mouth as needed.   levothyroxine (SYNTHROID, LEVOTHROID) 75 MCG tablet Take 75 mcg by mouth daily before breakfast. Brand Synthroid   loratadine (CLARITIN) 10 MG tablet Take 10 mg by mouth daily.   losartan (COZAAR) 25 MG tablet TAKE 1 TABLET BY MOUTH EVERY DAY AS NEEDED (FOR SYMPTOMATIC BLOOD PRESSURE SYSTOLIC OVER 180)   metoprolol succinate (TOPROL-XL) 50 MG 24 hr tablet TAKE 1 TABLET BY MOUTH EVERY DAY   ONETOUCH DELICA LANCETS 33G MISC -TEST TWICE DAILY   OVER THE COUNTER MEDICATION Diomemd   Pyridoxine HCl (VITAMIN B-6 PO) Take 1  tablet by mouth daily.   rosuvastatin (CRESTOR) 10 MG tablet TAKE 1 TABLET BY MOUTH EVERY DAY FOR CHOLESTEROL   Thiamine HCl (VITAMIN B-1) 250 MG tablet Take 250 mg by mouth daily.   vitamin B-12 (CYANOCOBALAMIN) 1000 MCG tablet Take 1,000 mcg by mouth daily.   MAGNESIUM GLYCINATE PLUS PO Take 1 capsule by mouth daily. (Patient not taking: Reported on 12/28/2022)   OMEGA-3 1000 MG CAPS Take 1,000 mg by mouth daily. (Patient not taking: Reported on 12/28/2022)   pantoprazole (PROTONIX) 40 MG tablet Take 40 mg by mouth daily. (Patient not taking: Reported on 12/28/2022)   No  facility-administered encounter medications on file as of 12/28/2022.    Allergies (verified) Elmiron [pentosan polysulfate sodium], Hctz [hydrochlorothiazide], Penicillins, and Sulfa antibiotics   History: Past Medical History:  Diagnosis Date   Allergy, unspecified not elsewhere classified    Chronic interstitial cystitis    Cough    Esophageal reflux    H/O syncope 2007   No recurrence;Tilt table 09/23/05-negative   Hyperlipidemia 08/22/2014   Hypertension    Hypertriglyceridemia    Irritable bowel syndrome    Nonocclusive coronary atherosclerosis of native coronary artery    Obstructive sleep apnea (adult) (pediatric)    Paroxysmal ventricular tachycardia (HCC)    Type II or unspecified type diabetes mellitus without mention of complication, not stated as uncontrolled    Unspecified asthma(493.90)    Unspecified hypothyroidism    Ventricular tachycardia (HCC)    Past Surgical History:  Procedure Laterality Date   ABDOMINAL HYSTERECTOMY  11/16/1979   for endometriosis   CHOLECYSTECTOMY  07/18/2000   ESOPHAGOGASTRODUODENOSCOPY ENDOSCOPY  07/18/2008   EYE SURGERY Left 01/30/2022   FACIAL NERVE SURGERY     12/2021   Family History  Problem Relation Age of Onset   Diabetes Mother    Breast cancer Mother    Arthritis Mother    Diabetes Father    Stomach cancer Father    Heart disease Father    Leukemia Brother    Breast cancer Maternal Aunt    Breast cancer Paternal Aunt    Social History   Socioeconomic History   Marital status: Married    Spouse name: Not on file   Number of children: Not on file   Years of education: Not on file   Highest education level: Not on file  Occupational History   Occupation: retired  Tobacco Use   Smoking status: Never   Smokeless tobacco: Never  Vaping Use   Vaping Use: Never used  Substance and Sexual Activity   Alcohol use: No   Drug use: No   Sexual activity: Yes  Other Topics Concern   Not on file  Social History  Narrative   Not on file   Social Determinants of Health   Financial Resource Strain: Low Risk  (12/28/2022)   Overall Financial Resource Strain (CARDIA)    Difficulty of Paying Living Expenses: Not hard at all  Food Insecurity: No Food Insecurity (12/28/2022)   Hunger Vital Sign    Worried About Running Out of Food in the Last Year: Never true    Ran Out of Food in the Last Year: Never true  Transportation Needs: No Transportation Needs (12/28/2022)   PRAPARE - Administrator, Civil Service (Medical): No    Lack of Transportation (Non-Medical): No  Physical Activity: Inactive (12/28/2022)   Exercise Vital Sign    Days of Exercise per Week: 0 days    Minutes of Exercise  per Session: 0 min  Stress: No Stress Concern Present (12/28/2022)   [employer] of Occupational Health - Occupational Stress Questionnaire    Feeling of Stress : Not at all  Social Connections: Moderately Integrated (12/28/2022)   Social Connection and Isolation Panel [NHANES]    Frequency of Communication with Friends and Family: More than three times a week    Frequency of Social Gatherings with Friends and Family: More than three times a week    Attends Religious Services: More than 4 times per year    Active Member of [employer] or Organizations: No    Attends Engineer, structural: Never    Marital Status: Married    Tobacco Counseling Counseling given: Not Answered   Clinical Intake:  Pre-visit preparation completed: Yes  Pain : No/denies pain     Nutritional Risks: Nausea/ vomitting/ diarrhea (occasional loose stools) Diabetes: Yes CBG done?: No Did pt. bring in CBG monitor from home?: No  How often do you need to have someone help you when you read instructions, pamphlets, or other written materials from your doctor or pharmacy?: 1 - Never  Diabetic? Nutrition Risk Assessment:  Has the patient had any N/V/D within the last 2 months?  Yes  Does the patient have any  non-healing wounds?  No  Has the patient had any unintentional weight loss or weight gain?  No   Diabetes:  Is the patient diabetic?  Yes  If diabetic, was a CBG obtained today?  No  Did the patient bring in their glucometer from home?  No  How often do you monitor your CBG's? Once every couple of weeks.   Financial Strains and Diabetes Management:  Are you having any financial strains with the device, your supplies or your medication? No .  Does the patient want to be seen by Chronic Care Management for management of their diabetes?  No  Would the patient like to be referred to a Nutritionist or for Diabetic Management?  No   Diabetic Exams:  Diabetic Eye Exam: Completed 09/05/22 Dr.Bowen Diabetic Foot Exam: Completed 03/21/22 Endocrinologist    Interpreter Needed?: No  Information entered by :: C.Davied Nocito LPN   Activities of Daily Living    12/28/2022    1:19 PM 04/04/2022    8:29 AM  In your present state of health, do you have any difficulty performing the following activities:  Hearing? 0 1  Vision? 0 1  Difficulty concentrating or making decisions? 1 1  Comment occasionally forgets   Walking or climbing stairs? 0 1  Dressing or bathing? 0 0  Doing errands, shopping? 0 1  Comment  does not drive  Preparing Food and eating ? N N  Using the Toilet? N N  In the past six months, have you accidently leaked urine? N Y  Do you have problems with loss of bowel control? Y Y  Comment on medication   Managing your Medications? N N  Managing your Finances? N N  Housekeeping or managing your Housekeeping? N N    Patient Care Team: Excell Seltzer, MD as PCP - General (Family Medicine) Croitoru, Rachelle Hora, MD as PCP - Cardiology (Cardiology) Lennette Bihari, MD as PCP - Sleep Medicine (Cardiology) Dorisann Frames, MD as Attending Physician (Endocrinology) Barnie Alderman, MD as Referring Physician (Otolaryngology) Sinda Du, MD as Consulting Physician  (Ophthalmology) Kaylyn Layer, DDS, PA as Referring Physician (Dentistry) Vilinda Flake, [facility] as Pharmacist (Pharmacist) Briant Sites, MD as Referring Physician (Gastroenterology)  Indicate  any recent Medical Services you may have received from other than [facility] providers in the past year (date may be approximate).     Assessment:   This is a routine wellness examination for Niaomi.  Hearing/Vision screen Hearing Screening - Comments:: Has some hearing difficulty Vision Screening - Comments:: Glasses- Dr.Bowen  Dietary issues and exercise activities discussed: Current Exercise Habits: The patient does not participate in regular exercise at present, Exercise limited by: None identified   Goals Addressed             This Visit's Progress    Patient Stated       Exercise more       Depression Screen    12/28/2022    1:17 PM 04/04/2022    8:29 AM 09/30/2021    4:08 PM 04/01/2021   10:21 AM 03/20/2020   11:35 AM 02/19/2019    2:14 PM 02/09/2018    9:50 AM  PHQ 2/9 Scores  PHQ - 2 Score 0 0 0 0 0 0 0  PHQ- 9 Score     0 6 0    Fall Risk    12/28/2022    1:18 PM 04/04/2022    8:28 AM 09/30/2021    4:08 PM 04/01/2021   10:20 AM 03/20/2020   11:33 AM  Fall Risk   Falls in the past year? 1 1 0 1 0  Comment  passed out     Number falls in past yr: 0 0  0 0  Injury with Fall? 0 1 0 1 0  Comment back is sore      Risk for fall due to : Impaired balance/gait Impaired balance/gait;Medication side effect   Medication side effect  Follow up Falls evaluation completed;Falls prevention discussed Falls evaluation completed;Education provided;Falls prevention discussed Falls evaluation completed  Falls evaluation completed;Falls prevention discussed    FALL RISK PREVENTION PERTAINING TO THE HOME:  Any stairs in or around the home? No  If so, are there any without handrails? No  Home free of loose throw rugs in walkways, pet beds, electrical cords, etc? Yes  Adequate lighting in your  home to reduce risk of falls? Yes   ASSISTIVE DEVICES UTILIZED TO PREVENT FALLS:  Life alert? No  Use of a cane, walker or w/c? No  Grab bars in the bathroom? Yes  Shower chair or bench in shower? Yes  Elevated toilet seat or a handicapped toilet? Yes    Cognitive Function:    03/20/2020   11:41 AM 02/19/2019    2:22 PM 02/09/2018    9:50 AM 02/08/2017   10:50 AM 01/14/2016    1:33 PM  MMSE - Mini Mental State Exam  Orientation to time 5 5 5 5 5   Orientation to Place 5 5 5 5 5   Registration 3 3 3 3 3   Attention/ Calculation 5 5 0 0 0  Recall 3 3 3 2 3   Language- name 2 objects  0 0 0 0  Language- repeat 1 1 1 1 1   Language- follow 3 step command  0 3 3 3   Language- read & follow direction  0 0 0 0  Write a sentence  0 0 0 0  Copy design  0 0 0 0  Total score  22 20 19 20         12/28/2022    1:20 PM 04/04/2022    8:31 AM  6CIT Screen  What Year? 0 points 0 points  What month? 0 points  0 points  What time? 0 points 0 points  Count back from 20 0 points 0 points  Months in reverse 0 points 0 points  Repeat phrase 0 points 2 points  Total Score 0 points 2 points    Immunizations Immunization History  Administered Date(s) Administered   Covid-19, Mrna,Vaccine(Spikevax)17yrs and older 07/04/2022   Fluad Quad(high Dose 65+) 05/07/2019, 03/27/2020, 04/01/2021, 04/05/2022   Influenza Split 05/15/2013   Influenza, High Dose Seasonal PF 04/19/2016, 05/09/2017, 04/25/2018   Influenza,inj,quad, With Preservative 03/18/2021   Influenza-Unspecified 05/01/2014, 06/03/2015, 04/19/2016   PFIZER(Purple Top)SARS-COV-2 Vaccination 07/30/2019, 08/20/2019, 05/08/2020   Pneumococcal Conjugate-13 08/15/2012   Pneumococcal Polysaccharide-23 11/06/2014   Tdap 06/03/2015   Zoster Recombinat (Shingrix) 03/31/2020, 07/01/2020   Zoster, Live 03/06/2013    TDAP status: Up to date  Flu Vaccine status: Up to date  Pneumococcal vaccine status: Up to date  Covid-19 vaccine status:  Information provided on how to obtain vaccines.   Qualifies for Shingles Vaccine? Yes   Zostavax completed Yes   Shingrix Completed?: Yes  Screening Tests Health Maintenance  Topic Date Due   Diabetic kidney evaluation - Urine ACR  02/10/2019   HEMOGLOBIN A1C  09/21/2021   COVID-19 Vaccine (5 - 2023-24 season) 08/29/2022   INFLUENZA VACCINE  02/16/2023   FOOT EXAM  03/22/2023   Diabetic kidney evaluation - eGFR measurement  04/09/2023   Colonoscopy  05/26/2023   OPHTHALMOLOGY EXAM  09/06/2023   MAMMOGRAM  12/27/2023   Medicare Annual Wellness (AWV)  12/28/2023   DTaP/Tdap/Td (2 - Td or Tdap) 06/02/2025   DEXA SCAN  12/13/2027   Pneumonia Vaccine 33+ Years old  Completed   Hepatitis C Screening  Completed   Zoster Vaccines- Shingrix  Completed   HPV VACCINES  Aged Out    Health Maintenance  Health Maintenance Due  Topic Date Due   Diabetic kidney evaluation - Urine ACR  02/10/2019   HEMOGLOBIN A1C  09/21/2021   COVID-19 Vaccine (5 - 2023-24 season) 08/29/2022    Colorectal cancer screening: Type of screening: Colonoscopy. Completed 05/25/22. Repeat every 1 years per pt  Mammogram status: Completed 12/27/22. Repeat every year  Bone Density status: Completed 12/13/22. Results reflect: Bone density results: NORMAL. Repeat every 5 years.  Lung Cancer Screening: (Low Dose CT Chest recommended if Age 51-80 years, 30 pack-year currently smoking OR have quit w/in 15years.) does not qualify.   Lung Cancer Screening Referral: no  Additional Screening:  Hepatitis C Screening: does qualify; Completed 01/14/16  Vision Screening: Recommended annual ophthalmology exams for early detection of glaucoma and other disorders of the eye. Is the patient up to date with their annual eye exam?  Yes  Who is the provider or what is the name of the office in which the patient attends annual eye exams? Dr.Bowen  If pt is not established with a provider, would they like to be referred to a  provider to establish care? Yes .   Dental Screening: Recommended annual dental exams for proper oral hygiene  Community Resource Referral / Chronic Care Management: CRR required this visit?  No   CCM required this visit?  No      Plan:     I have personally reviewed and noted the following in the patient's chart:   Medical and social history Use of alcohol, tobacco or illicit drugs  Current medications and supplements including opioid prescriptions. Patient is not currently taking opioid prescriptions. Functional ability and status Nutritional status Physical activity Advanced directives List of  other physicians Hospitalizations, surgeries, and ER visits in previous 12 months Vitals Screenings to include cognitive, depression, and falls Referrals and appointments  In addition, I have reviewed and discussed with patient certain preventive protocols, quality metrics, and best practice recommendations. A written personalized care plan for preventive services as well as general preventive health recommendations were provided to patient.     Maryan Puls, LPN   10/24/8117   Nurse Notes: none

## 2022-12-28 NOTE — Patient Instructions (Signed)
Ms. Limbert , Thank you for taking time to come for your Medicare Wellness Visit. I appreciate your ongoing commitment to your health goals. Please review the following plan we discussed and let me know if I can assist you in the future.   These are the goals we discussed:  Goals       Increase water intake (pt-stated)      Starting 02/08/2017, I will attempt to drink at least 6 glasses of water daily.      Manage My Medicine      Timeframe:  Long-Range Goal Priority:  Medium Start Date:           02/26/21                  Expected End Date:  02/26/22                     Follow Up Date Nov 2022   - call for medicine refill 2 or 3 days before it runs out - call if I am sick and can't take my medicine - keep a list of all the medicines I take; vitamins and herbals too - use a pillbox to sort medicine    Why is this important?   These steps will help you keep on track with your medicines.   Notes:       Patient Stated      Starting 02/09/2018, I will continue to walk 6000-10000 steps daily.       Patient Stated      Wants to get more limber and get down to 130 pounds      Patient Stated      03/20/2020, I will continue to walk 5,000 steps a day.      Patient Stated      04/04/2022, wants to get in better shape      Patient Stated      Exercise more        This is a list of the screening recommended for you and due dates:  Health Maintenance  Topic Date Due   Yearly kidney health urinalysis for diabetes  02/10/2019   Hemoglobin A1C  09/21/2021   COVID-19 Vaccine (5 - 2023-24 season) 08/29/2022   Flu Shot  02/16/2023   Complete foot exam   03/22/2023   Yearly kidney function blood test for diabetes  04/09/2023   Colon Cancer Screening  05/26/2023   Eye exam for diabetics  09/06/2023   Mammogram  12/27/2023   Medicare Annual Wellness Visit  12/28/2023   DTaP/Tdap/Td vaccine (2 - Td or Tdap) 06/02/2025   DEXA scan (bone density measurement)  12/13/2027   Pneumonia Vaccine   Completed   Hepatitis C Screening  Completed   Zoster (Shingles) Vaccine  Completed   HPV Vaccine  Aged Out    Advanced directives: Please bring a copy of your health care power of attorney and living will to the office to be added to your chart at your convenience.   Conditions/risks identified: Aim for 30 minutes of exercise or brisk walking, 6-8 glasses of water, and 5 servings of fruits and vegetables each day.   Next appointment: Follow up in one year for your annual wellness visit 01/01/24 @ 11:15 televisit   Preventive Care 65 Years and Older, Female Preventive care refers to lifestyle choices and visits with your health care provider that can promote health and wellness. What does preventive care include? A yearly physical exam. This is  also called an annual well check. Dental exams once or twice a year. Routine eye exams. Ask your health care provider how often you should have your eyes checked. Personal lifestyle choices, including: Daily care of your teeth and gums. Regular physical activity. Eating a healthy diet. Avoiding tobacco and drug use. Limiting alcohol use. Practicing safe sex. Taking low-dose aspirin every day. Taking vitamin and mineral supplements as recommended by your health care provider. What happens during an annual well check? The services and screenings done by your health care provider during your annual well check will depend on your age, overall health, lifestyle risk factors, and family history of disease. Counseling  Your health care provider may ask you questions about your: Alcohol use. Tobacco use. Drug use. Emotional well-being. Home and relationship well-being. Sexual activity. Eating habits. History of falls. Memory and ability to understand (cognition). Work and work Astronomer. Reproductive health. Screening  You may have the following tests or measurements: Height, weight, and BMI. Blood pressure. Lipid and cholesterol  levels. These may be checked every 5 years, or more frequently if you are over 66 years old. Skin check. Lung cancer screening. You may have this screening every year starting at age 59 if you have a 30-pack-year history of smoking and currently smoke or have quit within the past 15 years. Fecal occult blood test (FOBT) of the stool. You may have this test every year starting at age 73. Flexible sigmoidoscopy or colonoscopy. You may have a sigmoidoscopy every 5 years or a colonoscopy every 10 years starting at age 70. Hepatitis C blood test. Hepatitis B blood test. Sexually transmitted disease (STD) testing. Diabetes screening. This is done by checking your blood sugar (glucose) after you have not eaten for a while (fasting). You may have this done every 1-3 years. Bone density scan. This is done to screen for osteoporosis. You may have this done starting at age 29. Mammogram. This may be done every 1-2 years. Talk to your health care provider about how often you should have regular mammograms. Talk with your health care provider about your test results, treatment options, and if necessary, the need for more tests. Vaccines  Your health care provider may recommend certain vaccines, such as: Influenza vaccine. This is recommended every year. Tetanus, diphtheria, and acellular pertussis (Tdap, Td) vaccine. You may need a Td booster every 10 years. Zoster vaccine. You may need this after age 2. Pneumococcal 13-valent conjugate (PCV13) vaccine. One dose is recommended after age 43. Pneumococcal polysaccharide (PPSV23) vaccine. One dose is recommended after age 69. Talk to your health care provider about which screenings and vaccines you need and how often you need them. This information is not intended to replace advice given to you by your health care provider. Make sure you discuss any questions you have with your health care provider. Document Released: 07/31/2015 Document Revised: 03/23/2016  Document Reviewed: 05/05/2015 Elsevier Interactive Patient Education  2017 [employer].  Fall Prevention in the Home Falls can cause injuries. They can happen to people of all ages. There are many things you can do to make your home safe and to help prevent falls. What can I do on the outside of my home? Regularly fix the edges of walkways and driveways and fix any cracks. Remove anything that might make you trip as you walk through a door, such as a raised step or threshold. Trim any bushes or trees on the path to your home. Use bright outdoor lighting. Clear any walking  paths of anything that might make someone trip, such as rocks or tools. Regularly check to see if handrails are loose or broken. Make sure that both sides of any steps have handrails. Any raised decks and porches should have guardrails on the edges. Have any leaves, snow, or ice cleared regularly. Use sand or salt on walking paths during winter. Clean up any spills in your garage right away. This includes oil or grease spills. What can I do in the bathroom? Use night lights. Install grab bars by the toilet and in the tub and shower. Do not use towel bars as grab bars. Use non-skid mats or decals in the tub or shower. If you need to sit down in the shower, use a plastic, non-slip stool. Keep the floor dry. Clean up any water that spills on the floor as soon as it happens. Remove soap buildup in the tub or shower regularly. Attach bath mats securely with double-sided non-slip rug tape. Do not have throw rugs and other things on the floor that can make you trip. What can I do in the bedroom? Use night lights. Make sure that you have a light by your bed that is easy to reach. Do not use any sheets or blankets that are too big for your bed. They should not hang down onto the floor. Have a firm chair that has side arms. You can use this for support while you get dressed. Do not have throw rugs and other things on the floor  that can make you trip. What can I do in the kitchen? Clean up any spills right away. Avoid walking on wet floors. Keep items that you use a lot in easy-to-reach places. If you need to reach something above you, use a strong step stool that has a grab bar. Keep electrical cords out of the way. Do not use floor polish or wax that makes floors slippery. If you must use wax, use non-skid floor wax. Do not have throw rugs and other things on the floor that can make you trip. What can I do with my stairs? Do not leave any items on the stairs. Make sure that there are handrails on both sides of the stairs and use them. Fix handrails that are broken or loose. Make sure that handrails are as long as the stairways. Check any carpeting to make sure that it is firmly attached to the stairs. Fix any carpet that is loose or worn. Avoid having throw rugs at the top or bottom of the stairs. If you do have throw rugs, attach them to the floor with carpet tape. Make sure that you have a light switch at the top of the stairs and the bottom of the stairs. If you do not have them, ask someone to add them for you. What else can I do to help prevent falls? Wear shoes that: Do not have high heels. Have rubber bottoms. Are comfortable and fit you well. Are closed at the toe. Do not wear sandals. If you use a stepladder: Make sure that it is fully opened. Do not climb a closed stepladder. Make sure that both sides of the stepladder are locked into place. Ask someone to hold it for you, if possible. Clearly mark and make sure that you can see: Any grab bars or handrails. First and last steps. Where the edge of each step is. Use tools that help you move around (mobility aids) if they are needed. These include: Canes. Walkers. Scooters. Crutches. Turn  on the lights when you go into a dark area. Replace any light bulbs as soon as they burn out. Set up your furniture so you have a clear path. Avoid moving your  furniture around. If any of your floors are uneven, fix them. If there are any pets around you, be aware of where they are. Review your medicines with your doctor. Some medicines can make you feel dizzy. This can increase your chance of falling. Ask your doctor what other things that you can do to help prevent falls. This information is not intended to replace advice given to you by your health care provider. Make sure you discuss any questions you have with your health care provider. Document Released: 04/30/2009 Document Revised: 12/10/2015 Document Reviewed: 08/08/2014 Elsevier Interactive Patient Education  2017 [employer].

## 2023-01-25 NOTE — Telephone Encounter (Signed)
Notified patient that she can come by our office and pick up a new mask until she is able to order a new shipment of supplies with insurance coverage. All questions were answered. Patient verbalized understanding and will come into office on 01/31/23 to pick up mask.

## 2023-02-07 ENCOUNTER — Other Ambulatory Visit: Payer: Self-pay | Admitting: Family Medicine

## 2023-02-07 NOTE — Telephone Encounter (Signed)
Last office visit 04/05/22 for CPE.  Last refilled 10/06/2022 for #[id number] with 1 refill.  Next Appt: CPE 04/14/2023

## 2023-02-20 ENCOUNTER — Other Ambulatory Visit: Payer: Self-pay | Admitting: Family Medicine

## 2023-02-27 ENCOUNTER — Ambulatory Visit: Payer: Medicare Other | Attending: Cardiovascular Disease | Admitting: Cardiovascular Disease

## 2023-02-27 ENCOUNTER — Encounter: Payer: Self-pay | Admitting: Cardiovascular Disease

## 2023-02-27 ENCOUNTER — Other Ambulatory Visit: Payer: Self-pay | Admitting: Cardiovascular Disease

## 2023-02-27 ENCOUNTER — Telehealth: Payer: Self-pay | Admitting: Emergency Medicine

## 2023-02-27 ENCOUNTER — Ambulatory Visit: Payer: Medicare Other | Attending: Cardiovascular Disease

## 2023-02-27 VITALS — BP 138/90 | HR 142 | Ht 62.5 in | Wt 142.2 lb

## 2023-02-27 DIAGNOSIS — E1159 Type 2 diabetes mellitus with other circulatory complications: Secondary | ICD-10-CM

## 2023-02-27 DIAGNOSIS — I4729 Other ventricular tachycardia: Secondary | ICD-10-CM

## 2023-02-27 DIAGNOSIS — R0602 Shortness of breath: Secondary | ICD-10-CM

## 2023-02-27 DIAGNOSIS — I493 Ventricular premature depolarization: Secondary | ICD-10-CM

## 2023-02-27 DIAGNOSIS — E782 Mixed hyperlipidemia: Secondary | ICD-10-CM

## 2023-02-27 DIAGNOSIS — I471 Supraventricular tachycardia, unspecified: Secondary | ICD-10-CM | POA: Diagnosis not present

## 2023-02-27 DIAGNOSIS — E038 Other specified hypothyroidism: Secondary | ICD-10-CM | POA: Diagnosis not present

## 2023-02-27 DIAGNOSIS — G4733 Obstructive sleep apnea (adult) (pediatric): Secondary | ICD-10-CM

## 2023-02-27 LAB — COMPREHENSIVE METABOLIC PANEL
ALT: 19 IU/L (ref 0–32)
AST: 28 IU/L (ref 0–40)
Albumin: 5 g/dL — ABNORMAL HIGH (ref 3.8–4.8)
Alkaline Phosphatase: 64 IU/L (ref 44–121)
BUN/Creatinine Ratio: 20 (ref 12–28)
BUN: 16 mg/dL (ref 8–27)
Bilirubin Total: 0.3 mg/dL (ref 0.0–1.2)
CO2: 20 mmol/L (ref 20–29)
Calcium: 9.7 mg/dL (ref 8.7–10.3)
Chloride: 103 mmol/L (ref 96–106)
Creatinine, Ser: 0.8 mg/dL (ref 0.57–1.00)
Globulin, Total: 2.5 g/dL (ref 1.5–4.5)
Glucose: 148 mg/dL — ABNORMAL HIGH (ref 70–99)
Potassium: 4.8 mmol/L (ref 3.5–5.2)
Sodium: 139 mmol/L (ref 134–144)
Total Protein: 7.5 g/dL (ref 6.0–8.5)
eGFR: 75 mL/min/{1.73_m2} (ref >59)

## 2023-02-27 LAB — CBC

## 2023-02-27 LAB — LIPID PANEL
Chol/HDL Ratio: 4.2 ratio (ref 0.0–4.4)
Cholesterol, Total: 205 mg/dL — ABNORMAL HIGH (ref 100–199)
HDL: 49 mg/dL (ref >39)
LDL Chol Calc (NIH): 76 mg/dL (ref 0–99)
Triglycerides: 511 mg/dL — ABNORMAL HIGH (ref 0–149)
VLDL Cholesterol Cal: 80 mg/dL — ABNORMAL HIGH (ref 5–40)

## 2023-02-27 LAB — HEMOGLOBIN A1C

## 2023-02-27 LAB — TSH: TSH: 1.12 u[IU]/mL (ref 0.450–4.500)

## 2023-02-27 MED ORDER — METOPROLOL SUCCINATE ER 100 MG PO TB24: 100.0000 mg | ORAL_TABLET | Freq: Every day | ORAL | 3 refills | Status: DC

## 2023-02-27 NOTE — Telephone Encounter (Signed)
Called to give this patient a message/instructions from Dr [employer]. No answer, left message to call back. Will also send a MyChart message.   Dr Royann Shivers would like her to go ahead and take the 100 mg of Metoprolol today- do not wait until tomorrow to increase it. He would like her to send a download of her EKG (from smart watch around 4-5 pm (around 4 hours after taking the increased dose of metoprolol) to see if her heart rate has improved.

## 2023-02-27 NOTE — Telephone Encounter (Signed)
Patient is returning call. Please advise? 

## 2023-02-27 NOTE — Progress Notes (Unsigned)
Enrolled for [employer] to mail a ZIO XT long term holter monitor to the patients address on file.  

## 2023-02-27 NOTE — Progress Notes (Signed)
Patient ID: AMADEA VIK, female   DOB: 1946-03-09, 77 y.o.   MRN: [id number]    Cardiology Office Note    Date:  02/27/2023   ID:  Valerie Vargas, Valerie Vargas 08/15/1945, MRN [id number]  PCP:  Excell Seltzer, MD  Cardiologist:   Thurmon Fair, MD   No chief complaint on file.   History of Present Illness:  Valerie Vargas is a 77 y.o. female with OSA, a remote history of syncope in 2007, that recurred following a hot shower in 2022 , minor coronary artery disease by angiography in 2003, hypertension, hyperlipidemia, type 2 diabetes mellitus, neurally mediated syncope.  She presents in the office today feeling that her "chest is nervous".  Her ECG shows that she has tachycardia at a rate of 142 bpm.  The underlying rhythm appears to be SVT (prominent retrograde P waves are seen well beyond the QRS interval consistent with junctional tachycardia or slow-slow AV node reentry), also with frequent premature ventricular contractions with at least 2 different morphologies.  She does not feel palpitations, per se.  She is wearing a Fitbit that reports that her heart rate is only 93 bpm, consistent with pulse deficit from under counting of the PVCs.  She does not feel dizzy and she has not had any recent presyncopal episodes, although she has had 2 episodes of presyncope following warm showers.  She had a syncopal event October 2022, also in the shower, with serious injury requiring scalp stitches.  She had a previous syncopal event about 15 years earlier.  Her blood pressure has generally been well-controlled.  It is never too low, occasionally it is a little high, usually in the evenings.  She has very poor quality sleep.  She tries to be 100% compliant with the CPAP, but she finds that she unconsciously removes it at night and that she "sleepwalks".  Her Fitbit shows an apnea hypopnea index of about 25/h and significant desaturations.  She is doing her best to comply with the CPAP.  She feels that she  has trouble getting in enough air sometimes due to problems with her nose.  The patient specifically denies any chest pain at rest exertion, dyspnea with exertion, orthopnea, paroxysmal nocturnal dyspnea, syncope, palpitations, focal neurological deficits, intermittent claudication, lower extremity edema, unexplained weight gain, cough, hemoptysis or wheezing.   Metabolic control is fair with an LDL cholesterol of 99, HDL 65 and hemoglobin A1c of 6.0%.  She has normal renal function.  She has had vision problems related to a macular hole in her dominant right eye (her left eye has problems due to a childhood injury).  Even after cataract surgery she still only has 20/80 vision and she has been avoiding driving.  She has seen a retina specialist.  She was incidentally found to have empty sella syndrome when she had her CT for head injury in 2022.  In the past a tilt table test demonstrated that she had orthostatic hypotension. Normal Vascuscreen (minimal carotid plaque, normal ABI, no AAA) in May 2018.    Past Medical History:  Diagnosis Date   Allergy, unspecified not elsewhere classified    Chronic interstitial cystitis    Cough    Esophageal reflux    H/O syncope 2007   No recurrence;Tilt table 09/23/05-negative   Hyperlipidemia 08/22/2014   Hypertension    Hypertriglyceridemia    Irritable bowel syndrome    Nonocclusive coronary atherosclerosis of native coronary artery    Obstructive sleep apnea (adult) (pediatric)  Paroxysmal ventricular tachycardia (HCC)    Type II or unspecified type diabetes mellitus without mention of complication, not stated as uncontrolled    Unspecified asthma(493.90)    Unspecified hypothyroidism    Ventricular tachycardia [facility])     Past Surgical History:  Procedure Laterality Date   ABDOMINAL HYSTERECTOMY  11/16/1979   for endometriosis   CHOLECYSTECTOMY  07/18/2000   ESOPHAGOGASTRODUODENOSCOPY ENDOSCOPY  07/18/2008   EYE SURGERY Left 01/30/2022    FACIAL NERVE SURGERY     12/2021    Outpatient Medications Prior to Visit  Medication Sig Dispense Refill   Ascorbic Acid (VITAMIN C) 1000 MG tablet Take 1,000 mg by mouth daily.     blood glucose meter kit and supplies KIT Dispense based on patient and insurance preference. Use up to four times daily as directed. (FOR ICD-9 250.00, 250.01). 1 each 0   Cholecalciferol (VITAMIN D3) 1000 UNITS CAPS Take 1 capsule by mouth daily.     Cranberry 200 MG CAPS Take by mouth daily in the afternoon.     gabapentin (NEURONTIN) 100 MG capsule TAKE 3 CAPSULES BY MOUTH 3 TIMES DAILY 270 capsule 1   glucose blood (ONE TOUCH ULTRA TEST) test strip -TEST TWICE DAILY 100 each 6   Guaifenesin 1200 MG TB12 Take by mouth as needed.     levothyroxine (SYNTHROID, LEVOTHROID) 75 MCG tablet Take 75 mcg by mouth daily before breakfast. Brand Synthroid     loratadine (CLARITIN) 10 MG tablet Take 10 mg by mouth daily.     MAGNESIUM GLYCINATE PLUS PO Take 1 capsule by mouth daily.     OMEGA-3 1000 MG CAPS Take 1,000 mg by mouth daily.     ONETOUCH DELICA LANCETS 33G MISC -TEST TWICE DAILY 100 each 5   OVER THE COUNTER MEDICATION Diomemd     pantoprazole (PROTONIX) 40 MG tablet Take 40 mg by mouth daily.     Pyridoxine HCl (VITAMIN B-6 PO) Take 1 tablet by mouth daily.     rosuvastatin (CRESTOR) 10 MG tablet TAKE 1 TABLET BY MOUTH EVERY DAY FOR CHOLESTEROL 90 tablet 0   Thiamine HCl (VITAMIN B-1) 250 MG tablet Take 250 mg by mouth daily.     vitamin B-12 (CYANOCOBALAMIN) 1000 MCG tablet Take 1,000 mcg by mouth daily.     metoprolol succinate (TOPROL-XL) 50 MG 24 hr tablet TAKE 1 TABLET BY MOUTH EVERY DAY 30 tablet 5   ALPRAZolam (XANAX) 0.5 MG tablet TAKE 1 TABLET BY MOUTH EVERY DAY AS NEEDED (Patient not taking: Reported on 02/27/2023) 10 tablet 0   benzonatate (TESSALON) 100 MG capsule TAKE TWO CAPSULES BY MOUTH 3 TIMES A DAYAS NEEDED FOR COUGH (Patient not taking: Reported on 02/27/2023) 60 capsule 1    diphenhydrAMINE-PE-APAP 12.5-5-325 MG TABS Take 1 tablet by mouth at bedtime as needed. (Patient not taking: Reported on 02/27/2023)     gabapentin (NEURONTIN) 100 MG tablet Take 300 mg by mouth 3 (three) times daily.     losartan (COZAAR) 25 MG tablet TAKE 1 TABLET BY MOUTH EVERY DAY AS NEEDED (FOR SYMPTOMATIC BLOOD PRESSURE SYSTOLIC OVER 180) (Patient not taking: Reported on 02/27/2023) 30 tablet 10   No facility-administered medications prior to visit.     Allergies:   Elmiron [pentosan polysulfate sodium], Hctz [hydrochlorothiazide], Penicillins, and Sulfa antibiotics   Social History   Socioeconomic History   Marital status: Married    Spouse name: Not on file   Number of children: Not on file   Years of education: Not  on file   Highest education level: Not on file  Occupational History   Occupation: retired  Tobacco Use   Smoking status: Never   Smokeless tobacco: Never  Vaping Use   Vaping status: Never Used  Substance and Sexual Activity   Alcohol use: No   Drug use: No   Sexual activity: Yes  Other Topics Concern   Not on file  Social History Narrative   Not on file   Social Determinants of Health   Financial Resource Strain: Low Risk  (12/28/2022)   Overall Financial Resource Strain (CARDIA)    Difficulty of Paying Living Expenses: Not hard at all  Food Insecurity: No Food Insecurity (12/28/2022)   Hunger Vital Sign    Worried About Running Out of Food in the Last Year: Never true    Ran Out of Food in the Last Year: Never true  Transportation Needs: No Transportation Needs (12/28/2022)   PRAPARE - Administrator, Civil Service (Medical): No    Lack of Transportation (Non-Medical): No  Physical Activity: Inactive (12/28/2022)   Exercise Vital Sign    Days of Exercise per Week: 0 days    Minutes of Exercise per Session: 0 min  Stress: No Stress Concern Present (12/28/2022)   [employer] of Occupational Health - Occupational Stress Questionnaire     Feeling of Stress : Not at all  Social Connections: Moderately Integrated (12/28/2022)   Social Connection and Isolation Panel [NHANES]    Frequency of Communication with Friends and Family: More than three times a week    Frequency of Social Gatherings with Friends and Family: More than three times a week    Attends Religious Services: More than 4 times per year    Active Member of [employer] or Organizations: No    Attends Engineer, structural: Never    Marital Status: Married     Family History:  The patient's family history includes Arthritis in her mother; Breast cancer in her maternal aunt, mother, and paternal aunt; Diabetes in her father and mother; Heart disease in her father; Leukemia in her brother; Stomach cancer in her father.   ROS:   Please see the history of present illness.    ROS  All other systems are reviewed and are negative  PHYSICAL EXAM:   VS:  BP (!) 138/90 (BP Location: Left Arm, Patient Position: Sitting, Cuff Size: Normal)   Pulse (!) 142   Ht 5' 2.5" (1.588 m)   Wt 142 lb 3.2 oz (64.5 kg)   SpO2 95%   BMI 25.59 kg/m      General: Alert, oriented x3, no distress. Head: no evidence of trauma, PERRL, EOMI, no exophtalmos or lid lag, no myxedema, no xanthelasma; normal ears, nose and oropharynx Neck: normal jugular venous pulsations and no hepatojugular reflux; brisk carotid pulses without delay and no carotid bruits Chest: clear to auscultation, no signs of consolidation by percussion or palpation, normal fremitus, symmetrical and full respiratory excursions Cardiovascular: normal position and quality of the apical impulse, rapid irrregular rhythm, normal first and second heart sounds, no murmurs, rubs or gallops Abdomen: no tenderness or distention, no masses by palpation, no abnormal pulsatility or arterial bruits, normal bowel sounds, no hepatosplenomegaly Extremities: no clubbing, cyanosis or edema; 2+ radial, ulnar and brachial pulses bilaterally;  2+ right femoral, posterior tibial and dorsalis pedis pulses; 2+ left femoral, posterior tibial and dorsalis pedis pulses; no subclavian or femoral bruits Neurological: grossly nonfocal Psych: Normal mood and affect  Wt Readings from Last 3 Encounters:  02/27/23 142 lb 3.2 oz (64.5 kg)  12/28/22 138 lb (62.6 kg)  04/05/22 138 lb 2 oz (62.7 kg)     Studies/Labs Reviewed:   EKG:  EKG is ordered today and shows supraventricular tachycardia with frequent PVCs (2 morphologies are seen).  Retrograde P waves are seen following the narrow complex QRS, suggesting junctional tachycardia or uncommon AV node reentry.  There may be ST segment depression in the inferolateral leads, but it is hard to say because of the large negative P waves overall at the ST segment. Recent Labs:  03/19/2020 (Dr. Talmage Nap) hemoglobin A1c of 6.6%, fasting glucose 109  mild residual hypertriglyceridemia (even this is improved at 164).  HDL 49, LDL 55, total cholesterol 782.  04/08/2022 Cholesterol 185, HDL 65, LDL 99, triglycerides 105 Hemoglobin A1c 6.0% Hemoglobin 12.6, creatinine 0.95, potassium 5.0, ALT 13, TSH 1.15   Lipid Panel    Component Value Date/Time   CHOL 185 04/08/2022 0901   TRIG 105.0 04/08/2022 0901   HDL 65.40 04/08/2022 0901   CHOLHDL 3 04/08/2022 0901   VLDL 21.0 04/08/2022 0901   LDLCALC 99 04/08/2022 0901   LDLDIRECT 70.0 02/09/2018 1033    ASSESSMENT:    1. SVT (supraventricular tachycardia)   2. PVCs (premature ventricular contractions)   3. Shortness of breath   4. Mixed hyperlipidemia   5. Type 2 diabetes mellitus with other circulatory complications HTN (HCC)   6. Other specified hypothyroidism   7. OSA (obstructive sleep apnea)      PLAN:  In order of problems listed above:  SVT: Unusual rhythm.  Short R-P tachycardia. Very prominent negative P waves well beyond the QRS. This could represent junctional tachycardia which would be unusual at her age or slow slow AVNRT.   Vagal maneuvers and carotid sinus compression had no impact in the office.  Asked her to take an additional 50 mg of metoprolol succinate for a total of 100 mg today and will ask her to send [place] another ECG tracing later this afternoon.  She is only mildly symptomatic and is hemodynamically stable.  Also have her wear a monitor to see what the burden of the arrhythmia is.  If the arrhythmia is persistent she may benefit from at least a trial of intravenous adenosine and/or cardioversion.  Consider EP referral.   PVCs: Not sure how these relates to the SVT.  Checking labs today for electrolyte imbalance, excessive thyroid hormone, etc.  If this was AV node dependent tachycardia, and expect that maybe 1 of these numerous PVCs could terminate it. Syncope: She has had a few episodes of syncope during her life that sound highly consistent with vasovagal syncope, not arrhythmic events.  Has had 2 presyncopal events in the last year, both of them associated with hot showers. Elevated BP: Adequate blood pressure control.  Will increase her metoprolol for the arrhythmia, suspect she will still remain in normal blood pressure range. HLP: Acceptable lipid parameters. DM: Good glycemic control. OSA: She is having trouble maintaining CPAP throughout the night.  She is lean. I think she would be an excellent candidate for an inspire device.  I referred her to Dr. Christia Reading. Empty sella: Dr. Talmage Nap is her endocrinologist.    Medication Adjustments/Labs and Tests Ordered: Current medicines are reviewed at length with the patient today.  Concerns regarding medicines are outlined above.  Medication changes, Labs and Tests ordered today are listed in the Patient Instructions below. Patient Instructions  Medication Instructions:  Increase Metoprolol to 100 mg daily *If you need a refill on your cardiac medications before your next appointment, please call your pharmacy*   Lab Work: Lipid panel, A1C, TSH, CBC,  CMP If you have labs (blood work) drawn today and your tests are completely normal, you will receive your results only by: MyChart Message (if you have MyChart) OR A paper copy in the mail If you have any lab test that is abnormal or we need to change your treatment, we will call you to review the results.   Testing/Procedures: Your physician has requested that you have an echocardiogram. Echocardiography is a painless test that uses sound waves to create images of your heart. It provides your doctor with information about the size and shape of your heart and how well your heart's chambers and valves are working. This procedure takes approximately one hour. There are no restrictions for this procedure. Please do NOT wear cologne, perfume, aftershave, or lotions (deodorant is allowed). Please arrive 15 minutes prior to your appointment time.   Your physician has recommended that you wear a 7 DAY ZIO-PATCH monitor. The Zio patch cardiac monitor continuously records heart rhythm data for up to 14 days, this is for patients being evaluated for multiple types heart rhythms. For the first 24 hours post application, please avoid getting the Zio monitor wet in the shower or by excessive sweating during exercise. After that, feel free to carry on with regular activities. Keep soaps and lotions away from the ZIO XT Patch.  This will be mailed to you, please expect 7-10 days to receive.    Applying the monitor   Shave hair from upper left chest.   Hold abrader disc by orange tab.  Rub abrader in 40 strokes over left upper chest as indicated in your monitor instructions.   Clean area with 4 enclosed alcohol pads .  Use all pads to assure are is cleaned thoroughly.  Let dry.   Apply patch as indicated in monitor instructions.  Patch will be place under collarbone on left side of chest with arrow pointing upward.   Rub patch adhesive wings for 2 minutes.Remove white label marked "1".  Remove white label  marked "2".  Rub patch adhesive wings for 2 additional minutes.   While looking in a mirror, press and release button in center of patch.  A small green light will flash 3-4 times .  This will be your only indicator the monitor has been turned on.     Do not shower for the first 24 hours.  You may shower after the first 24 hours.   Press button if you feel a symptom. You will hear a small click.  Record Date, Time and Symptom in the Patient Log Book.   When you are ready to remove patch, follow instructions on last 2 pages of Patient Log Book.  Stick patch monitor onto last page of Patient Log Book.   Place Patient Log Book in [place] box.  Use locking tab on box and tape box closed securely.  The Orange and [employer] has [employer] on it.  Please place in mailbox as soon as possible.  Your physician should have your test results approximately 7 days after the monitor has been mailed back to [facility].   Call [facility] Customer Care at [phone] if you have questions regarding your ZIO XT patch monitor.  Call them immediately if you see an orange light blinking on your monitor.  If your monitor falls off in less than 4 days contact our Monitor department at [phone].  If your monitor becomes loose or falls off after 4 days call [employer] at [phone] for suggestions on securing your monitor    Follow-Up: At [facility], you and your health needs are our priority.  As part of our continuing mission to provide you with exceptional heart care, we have created designated Provider Care Teams.  These Care Teams include your primary Cardiologist (physician) and Advanced Practice Providers (APPs -  Physician Assistants and Nurse Practitioners) who all work together to provide you with the care you need, when you need it.  We recommend signing up for the patient portal called "MyChart".  Sign up information is provided on this After Visit Summary.  MyChart is used to  connect with patients for Virtual Visits (Telemedicine).  Patients are able to view lab/test results, encounter notes, upcoming appointments, etc.  Non-urgent messages can be sent to your provider as well.   To learn more about what you can do with MyChart, go to [email or web address].    Your next appointment:    APP in 1 month  Dr Royann Shivers in 6 months       Signed, Thurmon Fair, MD  02/27/2023 11:51 AM    [facility] [address] [place], [place], [place]  [address] Phone: [phone]; Fax: [phone]

## 2023-02-27 NOTE — Telephone Encounter (Signed)
Patient would like to know how to add the EKG to my chart. Are you able to help with this

## 2023-02-27 NOTE — Patient Instructions (Signed)
Medication Instructions:  Increase Metoprolol to 100 mg daily *If you need a refill on your cardiac medications before your next appointment, please call your pharmacy*   Lab Work: Lipid panel, A1C, TSH, CBC, CMP If you have labs (blood work) drawn today and your tests are completely normal, you will receive your results only by: MyChart Message (if you have MyChart) OR A paper copy in the mail If you have any lab test that is abnormal or we need to change your treatment, we will call you to review the results.   Testing/Procedures: Your physician has requested that you have an echocardiogram. Echocardiography is a painless test that uses sound waves to create images of your heart. It provides your doctor with information about the size and shape of your heart and how well your heart's chambers and valves are working. This procedure takes approximately one hour. There are no restrictions for this procedure. Please do NOT wear cologne, perfume, aftershave, or lotions (deodorant is allowed). Please arrive 15 minutes prior to your appointment time.   Your physician has recommended that you wear a 7 DAY ZIO-PATCH monitor. The Zio patch cardiac monitor continuously records heart rhythm data for up to 14 days, this is for patients being evaluated for multiple types heart rhythms. For the first 24 hours post application, please avoid getting the Zio monitor wet in the shower or by excessive sweating during exercise. After that, feel free to carry on with regular activities. Keep soaps and lotions away from the ZIO XT Patch.  This will be mailed to you, please expect 7-10 days to receive.    Applying the monitor   Shave hair from upper left chest.   Hold abrader disc by orange tab.  Rub abrader in 40 strokes over left upper chest as indicated in your monitor instructions.   Clean area with 4 enclosed alcohol pads .  Use all pads to assure are is cleaned thoroughly.  Let dry.   Apply patch as  indicated in monitor instructions.  Patch will be place under collarbone on left side of chest with arrow pointing upward.   Rub patch adhesive wings for 2 minutes.Remove white label marked "1".  Remove white label marked "2".  Rub patch adhesive wings for 2 additional minutes.   While looking in a mirror, press and release button in center of patch.  A small green light will flash 3-4 times .  This will be your only indicator the monitor has been turned on.     Do not shower for the first 24 hours.  You may shower after the first 24 hours.   Press button if you feel a symptom. You will hear a small click.  Record Date, Time and Symptom in the Patient Log Book.   When you are ready to remove patch, follow instructions on last 2 pages of Patient Log Book.  Stick patch monitor onto last page of Patient Log Book.   Place Patient Log Book in [place] box.  Use locking tab on box and tape box closed securely.  The [employer] and [employer] has [employer] on it.  Please place in mailbox as soon as possible.  Your physician should have your test results approximately 7 days after the monitor has been mailed back to [facility].   Call [facility] Customer Care at [phone] if you have questions regarding your ZIO XT patch monitor.  Call them immediately if you see an orange light blinking on your monitor.  If your monitor falls off in less than 4 days contact our Monitor department at [phone].  If your monitor becomes loose or falls off after 4 days call [employer] at [phone] for suggestions on securing your monitor    Follow-Up: At [facility], you and your health needs are our priority.  As part of our continuing mission to provide you with exceptional heart care, we have created designated Provider Care Teams.  These Care Teams include your primary Cardiologist (physician) and Advanced Practice Providers (APPs -  Physician Assistants and Nurse Practitioners) who all work  together to provide you with the care you need, when you need it.  We recommend signing up for the patient portal called "MyChart".  Sign up information is provided on this After Visit Summary.  MyChart is used to connect with patients for Virtual Visits (Telemedicine).  Patients are able to view lab/test results, encounter notes, upcoming appointments, etc.  Non-urgent messages can be sent to your provider as well.   To learn more about what you can do with MyChart, go to [email or web address].    Your next appointment:    APP in 1 month  Dr Royann Shivers in 6 months

## 2023-03-02 ENCOUNTER — Encounter (INDEPENDENT_AMBULATORY_CARE_PROVIDER_SITE_OTHER): Payer: Self-pay

## 2023-03-02 DIAGNOSIS — I4729 Other ventricular tachycardia: Secondary | ICD-10-CM

## 2023-03-02 DIAGNOSIS — I471 Supraventricular tachycardia, unspecified: Secondary | ICD-10-CM | POA: Diagnosis not present

## 2023-03-02 DIAGNOSIS — I493 Ventricular premature depolarization: Secondary | ICD-10-CM

## 2023-03-11 DIAGNOSIS — I493 Ventricular premature depolarization: Secondary | ICD-10-CM | POA: Diagnosis not present

## 2023-03-11 DIAGNOSIS — I4729 Other ventricular tachycardia: Secondary | ICD-10-CM | POA: Diagnosis not present

## 2023-03-13 ENCOUNTER — Telehealth: Payer: Self-pay | Admitting: Cardiovascular Disease

## 2023-03-13 MED ORDER — METOPROLOL SUCCINATE ER 100 MG PO TB24: 100.0000 mg | ORAL_TABLET | Freq: Every day | ORAL | 3 refills | Status: DC

## 2023-03-13 NOTE — Telephone Encounter (Signed)
*  STAT* If patient is at the pharmacy, call can be transferred to refill team.   1. Which medications need to be refilled? (please list name of each medication and dose if known) metoprolol succinate (TOPROL-XL) 100 MG 24 hr tablet  2. Which pharmacy/location (including street and city if local pharmacy) is medication to be sent to? [facility] - [address] [place], [place], [place] [address]  3. Do they need a 30 day or 90 day supply?   Patient is out of town and does not have her medication. She is requesting to have a small supply of 4 tablets sent to the pharmacy listed above ASAP.

## 2023-03-13 NOTE — Telephone Encounter (Signed)
Patient called to get update on this medication refill. Requesting call back.

## 2023-03-14 ENCOUNTER — Encounter: Payer: Self-pay | Admitting: *Deleted

## 2023-03-14 MED ORDER — METOPROLOL SUCCINATE ER 100 MG PO TB24: 100.0000 mg | ORAL_TABLET | Freq: Every day | ORAL | 0 refills | Status: DC

## 2023-03-14 NOTE — Telephone Encounter (Signed)
4 tablets of [facility]rolol [facility] to [facility], [address] [place], [place], [place] [address] , per pt's request.

## 2023-03-14 NOTE — Addendum Note (Signed)
Addended by: Burnetta Sabin on: 03/14/2023 06:39 AM   Modules accepted: Orders

## 2023-03-21 ENCOUNTER — Other Ambulatory Visit: Payer: Self-pay | Admitting: Medical Genetics

## 2023-03-21 DIAGNOSIS — Z006 Encounter for examination for normal comparison and control in clinical research program: Secondary | ICD-10-CM

## 2023-03-22 ENCOUNTER — Ambulatory Visit (HOSPITAL_COMMUNITY): Payer: Medicare Other | Attending: Cardiovascular Disease

## 2023-03-22 DIAGNOSIS — R0602 Shortness of breath: Secondary | ICD-10-CM | POA: Insufficient documentation

## 2023-03-22 LAB — ECHOCARDIOGRAM COMPLETE
Area-P 1/2: 3.72 cm2
S' Lateral: 2.9 cm

## 2023-03-28 ENCOUNTER — Encounter: Payer: Self-pay | Admitting: Family Medicine

## 2023-03-30 NOTE — Progress Notes (Unsigned)
Cardiology Clinic Note   Date: 04/03/2023 ID: Valerie Vargas Nov 06, 1945, MRN [id number]  Primary Cardiologist:  Thurmon Fair, MD  Patient Profile    Valerie Vargas is a 77 y.o. female who presents to the clinic today for follow up after testing.     Past medical history significant for: Nonobstructive CAD. LHC 12/17/2001 (exercise-induced VT): No significant atherosclerotic coronary artery disease. Palpitations. 7-day ZIO 03/13/2023: HR 48-182 bpm, average 75 bpm. 4 VT runs with max rate 182 bpm, longest/fastest 10 beats. 3 SVT runs, longest 17 beats. Frequent PVCs (8.2%).  Mildly abnormal arrhythmia monitor with rare brief episodes of nonsustained ectopic atrial tachycardia and nonsustained ventricular tachycardia.  Patient's symptoms correlated mostly with periods of increased PVCs. Echo 03/22/2023: EF 60 to 65%.  No RWMA.  Grade I DD.  Low normal RV function.  No significant valvular abnormalities. Hypertension. Hyperlipidemia. Lipid panel 02/27/2023: LDL 76, HDL 49, TG 511, total 205. T2DM. Hypothyroidism. Syncope. Tilt table test 09/23/2005: Negative head up tilt table testing.  Orthostatic hypotension. OSA. On CPAP. Inconsistent use secondary to taking mask off while sleeping. Awaiting scheduling with Dr. Jenne Pane for possible inspire.      History of Present Illness    Valerie Vargas is a longtime patient of cardiology.  She is followed by Dr. Royann Shivers for the above outlined history.  In summary, patient has a remote history of LHC in the setting of exercise-induced VT which showed no significant atherosclerotic coronary artery disease June 2003.  She has a history of syncope with a negative tilt table test in March 2007.  She is followed by Dr. Tresa Endo for OSA.  Patient was last seen in the office by Dr. Royann Shivers on 02/27/2023 with complaints of feeling as though her "chest is nervous."  EKG showed tachycardia, 142 bpm with the underlying rhythm appearing to be SVT (prominent  retrograde P waves are seen will be on the QRS interval consistent with junctional tachycardia or slow-slow AV nodal reentry) and frequent PVCs with at least 2 different morphologies.  She did not complain of palpitations.  She reported poor quality sleep stating she unconsciously removes CPAP at night.  Her Fitbit shows an apnea hypopnea index of about 25/h and significant desaturations.  7-day ZIO showed rare brief episodes of nonsustained ectopic atrial tachycardia and nonsustained ventricular tachycardia.  Patient's symptoms correlated mostly with periods of increased PVCs.  Echo showed normal LV function, Grade I DD, low normal RV function.  Today, patient reports feeling somewhat improved since increasing metoprolol. She continues to have days that she feels "bad" when she feels unbalanced and requires frequent rest breaks while doing simple tasks around her home. She states "I want my life back." She has not had any recent episodes of syncope. She denies chest pain, pressure or tightness. She has not been scheduled to see Dr. Jenne Pane yet regarding the [facility] for sleep apnea.     ROS: All other systems reviewed and are otherwise negative except as noted in History of Present Illness.  Studies Reviewed    EKG Interpretation Date/Time:  Monday April 03 2023 11:32:55 EDT Ventricular Rate:  67 PR Interval:  142 QRS Duration:  68 QT Interval:  418 QTC Calculation: 441 R Axis:   12  Text Interpretation: Normal sinus rhythm Normal ECG When compared with ECG of 27-Feb-2023 08:47, Premature ventricular complexes are no longer Present Vent. rate has decreased BY  75 BPM Non-specific change in ST segment in Inferior leads ST no longer  depressed in Anterolateral leads Nonspecific T wave abnormality has replaced inverted T waves in Inferior leads Nonspecific T wave abnormality no longer evident in Lateral leads Confirmed by Carlos Levering [phone]) on 04/03/2023 11:37:32 AM   Risk  Assessment/Calculations      HYPERTENSION CONTROL Vitals:   04/03/23 1123 04/03/23 1218  BP: (!) 160/70 (!) 142/70    The patient's blood pressure is elevated above target today.  In order to address the patient's elevated BP: Blood pressure will be monitored at home to determine if medication changes need to be made.           Physical Exam    VS:  BP (!) 160/70 (BP Location: Left Arm, Patient Position: Sitting, Cuff Size: Normal)   Pulse 67   Ht 5' 2.5" (1.588 m)   Wt 142 lb (64.4 kg)   BMI 25.56 kg/m  , BMI Body mass index is 25.56 kg/m.  GEN: Well nourished, well developed, in no acute distress. Neck: No JVD or carotid bruits. Cardiac:  RRR. Occasional extrasystole.  No murmurs. No rubs or gallops.   Respiratory:  Respirations regular and unlabored. Clear to auscultation without rales, wheezing or rhonchi. GI: Soft, nontender, nondistended. Extremities: Radials/DP/PT 2+ and equal bilaterally. No clubbing or cyanosis. No edema.  Skin: Warm and dry, no rash. Neuro: Strength intact.  Assessment & Plan    Nonobstructive CAD.  LHC June 2003 in the setting of exercise-induced VT showed no significant atherosclerotic CAD. Patient  denies chest pain, pressure or tightness. Continue Toprol (see #2), rosuvastatin. Palpitations/NSVT/frequent PVCs/ SVT.  7-day ZIO August 2024 showed rare brief episodes of nonsustained ectopic atrial tachycardia and nonsustained ventricular tachycardia, frequent PVCs 8.2%.  Patient symptomatic mostly with periods of increased PVCs.  Patient reports some improvement with increased dose of Toprol. She continues to have days when she feels bad with increased feeling of imbalance and requiring more frequent rest breaks when doing household tasks. EKG shows NSR today. Extrasystole on auscultation today.  Will increase Toprol to 50 mg in the morning and 100 mg at night. If this does not help improve symptoms will refer to EP. Dr. Royann Shivers in agreement with  plan.  Hypertension. BP today 160/70 on intake and 142/70 on my recheck. Home BP typically in the 130s/70s.  Continue Toprol, losartan. Hyperlipidemia.  LDL August 2024 76, close to goal.  Continue rosuvastatin.  Disposition: Increase Toprol to 50 mg in the morning and 100 mg in the evening. Return in 6 months or sooner as needed.          Signed, Etta Grandchild. Krista Som, DNP, NP-C

## 2023-04-03 ENCOUNTER — Encounter: Payer: Self-pay | Admitting: Student

## 2023-04-03 ENCOUNTER — Ambulatory Visit: Payer: Medicare Other | Attending: Student | Admitting: Student

## 2023-04-03 ENCOUNTER — Other Ambulatory Visit: Payer: Self-pay | Admitting: *Deleted

## 2023-04-03 VITALS — BP 142/70 | HR 67 | Ht 62.5 in | Wt 142.0 lb

## 2023-04-03 DIAGNOSIS — I10 Essential (primary) hypertension: Secondary | ICD-10-CM

## 2023-04-03 DIAGNOSIS — R002 Palpitations: Secondary | ICD-10-CM

## 2023-04-03 DIAGNOSIS — I471 Supraventricular tachycardia, unspecified: Secondary | ICD-10-CM | POA: Diagnosis not present

## 2023-04-03 DIAGNOSIS — I2510 Atherosclerotic heart disease of native coronary artery without angina pectoris: Secondary | ICD-10-CM

## 2023-04-03 DIAGNOSIS — I4729 Other ventricular tachycardia: Secondary | ICD-10-CM

## 2023-04-03 DIAGNOSIS — E785 Hyperlipidemia, unspecified: Secondary | ICD-10-CM

## 2023-04-03 DIAGNOSIS — I493 Ventricular premature depolarization: Secondary | ICD-10-CM

## 2023-04-03 MED ORDER — METOPROLOL SUCCINATE ER 50 MG PO TB24: 50.0000 mg | ORAL_TABLET | Freq: Every day | ORAL | 3 refills | Status: DC

## 2023-04-03 MED ORDER — METOPROLOL SUCCINATE ER 50 MG PO TB24: ORAL_TABLET | ORAL | 3 refills | Status: DC

## 2023-04-03 NOTE — Addendum Note (Signed)
Addended by: Corey Harold T on: 04/03/2023 01:09 PM   Modules accepted: Orders

## 2023-04-03 NOTE — Patient Instructions (Signed)
Medication Instructions:  START METOPROLOL SUCCINATE 50 MG IN THE MORNING AND CONTINUE TO TAKE 100 MG AT NIGHT. *If you need a refill on your cardiac medications before your next appointment, please call your pharmacy*   Lab Work: NO LABS If you have labs (blood work) drawn today and your tests are completely normal, you will receive your results only by: MyChart Message (if you have MyChart) OR A paper copy in the mail If you have any lab test that is abnormal or we need to change your treatment, we will call you to review the results.   Testing/Procedures: NO TESTING   Follow-Up: At [facility], you and your health needs are our priority.  As part of our continuing mission to provide you with exceptional heart care, we have created designated Provider Care Teams.  These Care Teams include your primary Cardiologist (physician) and Advanced Practice Providers (APPs -  Physician Assistants and Nurse Practitioners) who all work together to provide you with the care you need, when you need it.   Your next appointment:   6 month(s)  Provider:   Thurmon Fair, MD  Other Instructions DR. Christia Reading [phone]

## 2023-04-14 ENCOUNTER — Ambulatory Visit (INDEPENDENT_AMBULATORY_CARE_PROVIDER_SITE_OTHER): Payer: Medicare Other | Admitting: Family Medicine

## 2023-04-14 VITALS — BP 122/58 | HR 74 | Temp 97.9°F | Ht 62.5 in | Wt 142.4 lb

## 2023-04-14 DIAGNOSIS — E1159 Type 2 diabetes mellitus with other circulatory complications: Secondary | ICD-10-CM | POA: Diagnosis not present

## 2023-04-14 DIAGNOSIS — I152 Hypertension secondary to endocrine disorders: Secondary | ICD-10-CM

## 2023-04-14 DIAGNOSIS — Z0000 Encounter for general adult medical examination without abnormal findings: Secondary | ICD-10-CM

## 2023-04-14 DIAGNOSIS — E1169 Type 2 diabetes mellitus with other specified complication: Secondary | ICD-10-CM

## 2023-04-14 DIAGNOSIS — G4733 Obstructive sleep apnea (adult) (pediatric): Secondary | ICD-10-CM | POA: Diagnosis not present

## 2023-04-14 DIAGNOSIS — E785 Hyperlipidemia, unspecified: Secondary | ICD-10-CM

## 2023-04-14 DIAGNOSIS — E038 Other specified hypothyroidism: Secondary | ICD-10-CM

## 2023-04-14 LAB — MICROALBUMIN / CREATININE URINE RATIO
Creatinine,U: 29 mg/dL
Microalb Creat Ratio: 3.1 mg/g (ref 0.0–30.0)
Microalb, Ur: 0.9 mg/dL (ref 0.0–1.9)

## 2023-04-14 LAB — HM DIABETES FOOT EXAM

## 2023-04-14 MED ORDER — BENZONATATE 100 MG PO CAPS: ORAL_CAPSULE | ORAL | 1 refills | Status: DC

## 2023-04-14 MED ORDER — ALPRAZOLAM 0.5 MG PO TABS: 0.5000 mg | ORAL_TABLET | Freq: Every day | ORAL | 0 refills | Status: AC | PRN

## 2023-04-14 NOTE — Assessment & Plan Note (Signed)
Stable, chronic.  Continue current medication.  Synthroid 75 mcg daily

## 2023-04-14 NOTE — Assessment & Plan Note (Signed)
Stable, chronic.  Continue current medication.   Losartan 25 mg p.o. daily Metoprolol XL 50 mg p.o. daily

## 2023-04-14 NOTE — Assessment & Plan Note (Addendum)
Stable, chronic.   Diet controlled.    No longer on metformin BID. Followed by Dr. Talmage Nap.

## 2023-04-14 NOTE — Progress Notes (Signed)
Patient ID: Valerie Vargas, female    DOB: May 11, 1946, 77 y.o.   MRN: [id number]  This visit was conducted in person.  BP (!) 122/58 (BP Location: Left Arm, Patient Position: Sitting, Cuff Size: Normal)   Pulse 74   Temp 97.9 F (36.6 C) (Oral)   Ht 5' 2.5" (1.588 m)   Wt 142 lb 6.4 oz (64.6 kg)   SpO2 98%   BMI 25.63 kg/m    CC:  Chief Complaint  Patient presents with   Annual Exam    Subjective:   HPI: MAYRA JOLLIFFE is a 77 y.o. female presenting on 04/14/2023 for Annual Exam  The patient presents for  complete physical and review of chronic health problems. He/She also has the following acute concerns today:  She is still having digestive issues. She is having issues with alternating constipation to diarrhea, copious gas. Bloating. She is feeling fatigued.  Having stool leakage. No testing well but no specific  food causes problems.  Has been seeing GI.. colonoscopy, barium follow thorough and rectal  sphincter test done... all normal.  Planning repeat colonoscopy.  Some better with BIoMe probiotic.  IB guard was not helpful.  Wt Readings from Last 3 Encounters:  04/14/23 142 lb 6.4 oz (64.6 kg)  04/03/23 142 lb (64.4 kg)  02/27/23 142 lb 3.2 oz (64.5 kg)    The patient saw a LPN or RN for medicare wellness visit. 12/28/22  Prevention and wellness was reviewed in detail. Note reviewed and important notes copied below.   Reviewed recent Cardiology note 9/16/204 non obstructive CAD, palpitations...  7-day ZIO August 2024 showed rare brief episodes of nonsustained ectopic atrial tachycardia and nonsustained ventricular tachycardia, frequent PVCs 8.2%.  Patient symptomatic mostly with periods of increased PVCs.  Recent increase in metoprolol 50 mg in AM and  to 100 mg  at night... she has switched it to 100 in AM and 50 in PM.  Hypothyroidism: Chronic, well-controlled on namebrand Synthroid 75 mcg daily followed by Endo. Lab Results  Component Value Date   TSH  1.120 02/27/2023     Diabetes: Well-controlled .Marland Kitchen No longer on metfomrin. Lab Results  Component Value Date   HGBA1C 6.6 (H) 02/27/2023  Using medications without difficulties: Hypoglycemic episodes: none Hyperglycemic episodes: none Feet problems: no ulcers Blood Sugars averaging:  not checking regularly. eye exam within last year: yes  Hypertension:   Good control in office today on losartan 25 mg daily and metoprolol XL 50 mg p.o. daily BP Readings from Last 3 Encounters:  04/14/23 (!) 122/58  04/03/23 (!) 142/70  02/27/23 (!) 138/90  Using medication without problems or lightheadedness:  none Chest pain with exertion: none Edema:none Short of breath: none Average home BPs: Other issues: followed by cardiology... told to have BP higher than normal given syncope... only using losartan if BP spiking up.  Elevated Cholesterol: LDL at goal on Crestor 10 mg p.o. daily. Lab Results  Component Value Date   CHOL 205 (H) 02/27/2023   HDL 49 02/27/2023   LDLCALC 76 02/27/2023   LDLDIRECT 70.0 02/09/2018   TRIG 511 (H) 02/27/2023   CHOLHDL 4.2 02/27/2023  Using medications without problems: Muscle aches:  Diet compliance: issues as below Exercise:  none.. trying to get back to exercise. Other complaints:   ENT prescribing gabapentin, alprazolam and benzonatate for spasmodic dysphonia: hoarse voice, throat clearing etc.  Fairly stable overall.. now released by ENT for me to prescribe these meds.  Relevant past medical, surgical, family and social history reviewed and updated as indicated. Interim medical history since our last visit reviewed. Allergies and medications reviewed and updated. Outpatient Medications Prior to Visit  Medication Sig Dispense Refill   Ascorbic Acid (VITAMIN C) 1000 MG tablet Take 1,000 mg by mouth daily.     blood glucose meter kit and supplies KIT Dispense based on patient and insurance preference. Use up to four times daily as directed. (FOR  ICD-9 250.00, 250.01). 1 each 0   Cholecalciferol (VITAMIN D3) 1000 UNITS CAPS Take 1 capsule by mouth daily.     Cranberry 200 MG CAPS Take by mouth daily in the afternoon.     diphenhydrAMINE-PE-APAP 12.5-5-325 MG TABS Take 1 tablet by mouth at bedtime as needed.     gabapentin (NEURONTIN) 100 MG tablet Take 300 mg by mouth 3 (three) times daily.     glucose blood (ONE TOUCH ULTRA TEST) test strip -TEST TWICE DAILY 100 each 6   Guaifenesin 1200 MG TB12 Take by mouth as needed.     levothyroxine (SYNTHROID, LEVOTHROID) 75 MCG tablet Take 75 mcg by mouth daily before breakfast. Brand Synthroid     loratadine (CLARITIN) 10 MG tablet Take 10 mg by mouth daily.     metoprolol succinate (TOPROL-XL) 50 MG 24 hr tablet Take 1 50 mg tablet in the morning with or immediately following a meal and 2 tablets(100 mg) at night. (Patient taking differently: Take 1 50 mg tablet in the morning with or immediately following a meal and 2 tablets(100 mg) at night.) 90 tablet 3   OMEGA-3 1000 MG CAPS Take 1,000 mg by mouth daily.     ONETOUCH DELICA LANCETS 33G MISC -TEST TWICE DAILY 100 each 5   OVER THE COUNTER MEDICATION Biomemd     Pyridoxine HCl (VITAMIN B-6 PO) Take 1 tablet by mouth daily.     rosuvastatin (CRESTOR) 10 MG tablet TAKE 1 TABLET BY MOUTH EVERY DAY FOR CHOLESTEROL 90 tablet 0   vitamin B-12 (CYANOCOBALAMIN) 1000 MCG tablet Take 1,000 mcg by mouth daily.     ALPRAZolam (XANAX) 0.5 MG tablet TAKE 1 TABLET BY MOUTH EVERY DAY AS NEEDED 10 tablet 0   benzonatate (TESSALON) 100 MG capsule TAKE TWO CAPSULES BY MOUTH 3 TIMES A DAYAS NEEDED FOR COUGH 60 capsule 1   Thiamine HCl (VITAMIN B-1) 250 MG tablet Take 250 mg by mouth daily. (Patient not taking: Reported on 04/14/2023)     gabapentin (NEURONTIN) 100 MG capsule TAKE 3 CAPSULES BY MOUTH 3 TIMES DAILY 270 capsule 1   losartan (COZAAR) 25 MG tablet TAKE 1 TABLET BY MOUTH EVERY DAY AS NEEDED (FOR SYMPTOMATIC BLOOD PRESSURE SYSTOLIC OVER 180) (Patient  not taking: Reported on 04/14/2023) 30 tablet 10   MAGNESIUM GLYCINATE PLUS PO Take 1 capsule by mouth daily. (Patient not taking: Reported on 04/14/2023)     pantoprazole (PROTONIX) 40 MG tablet Take 40 mg by mouth daily.     No facility-administered medications prior to visit.     Per HPI unless specifically indicated in ROS section below Review of Systems  Constitutional:  Positive for fatigue. Negative for fever.  HENT:  Negative for congestion.   Eyes:  Negative for pain.  Respiratory:  Negative for cough and shortness of breath.   Cardiovascular:  Negative for chest pain, palpitations and leg swelling.  Gastrointestinal:  Positive for abdominal distention, abdominal pain, constipation and diarrhea. Negative for anal bleeding, blood in stool, nausea, rectal pain and vomiting.  Genitourinary:  Negative for dysuria and vaginal bleeding.  Musculoskeletal:  Negative for back pain.  Neurological:  Negative for syncope, light-headedness and headaches.  Psychiatric/Behavioral:  Negative for dysphoric mood.    Objective:  BP (!) 122/58 (BP Location: Left Arm, Patient Position: Sitting, Cuff Size: Normal)   Pulse 74   Temp 97.9 F (36.6 C) (Oral)   Ht 5' 2.5" (1.588 m)   Wt 142 lb 6.4 oz (64.6 kg)   SpO2 98%   BMI 25.63 kg/m   Wt Readings from Last 3 Encounters:  04/14/23 142 lb 6.4 oz (64.6 kg)  04/03/23 142 lb (64.4 kg)  02/27/23 142 lb 3.2 oz (64.5 kg)      Physical Exam Constitutional:      General: She is not in acute distress.    Appearance: Normal appearance. She is well-developed. She is not ill-appearing or toxic-appearing.  HENT:     Head: Normocephalic.     Right Ear: Hearing, tympanic membrane, ear canal and external ear normal. Tympanic membrane is not erythematous, retracted or bulging.     Left Ear: Hearing, tympanic membrane, ear canal and external ear normal. Tympanic membrane is not erythematous, retracted or bulging.     Nose: No mucosal edema or rhinorrhea.      Right Sinus: No maxillary sinus tenderness or frontal sinus tenderness.     Left Sinus: No maxillary sinus tenderness or frontal sinus tenderness.     Mouth/Throat:     Pharynx: Uvula midline.  Eyes:     General: Lids are normal. Lids are everted, no foreign bodies appreciated.     Conjunctiva/sclera: Conjunctivae normal.     Pupils: Pupils are equal, round, and reactive to light.  Neck:     Thyroid: No thyroid mass or thyromegaly.     Vascular: No carotid bruit.     Trachea: Trachea normal.  Cardiovascular:     Rate and Rhythm: Normal rate and regular rhythm.     Pulses: Normal pulses.     Heart sounds: Normal heart sounds, S1 normal and S2 normal. No murmur heard.    No friction rub. No gallop.  Pulmonary:     Effort: Pulmonary effort is normal. No tachypnea or respiratory distress.     Breath sounds: Normal breath sounds. No decreased breath sounds, wheezing, rhonchi or rales.  Abdominal:     General: Bowel sounds are normal.     Palpations: Abdomen is soft.     Tenderness: There is no abdominal tenderness.  Musculoskeletal:     Cervical back: Normal range of motion and neck supple.  Skin:    General: Skin is warm and dry.     Findings: No rash.  Neurological:     Mental Status: She is alert.  Psychiatric:        Mood and Affect: Mood is not anxious or depressed.        Speech: Speech normal.        Behavior: Behavior normal. Behavior is cooperative.        Thought Content: Thought content normal.        Judgment: Judgment normal.     Diabetic foot exam: Normal inspection No skin breakdown No calluses  Normal DP pulses Normal sensation to light touch and monofilament Nails normal     Results for orders placed or performed in visit on 04/14/23  HM DIABETES FOOT EXAM  Result Value Ref Range   HM Diabetic Foot Exam done      COVID 19  screen:  No recent travel or known exposure to COVID19 The patient denies respiratory symptoms of COVID 19 at this  time. The importance of social distancing was discussed today.   Assessment and Plan The patient's preventative maintenance and recommended screening tests for an annual wellness exam were reviewed in full today. Brought up to date unless services declined.  Counselled on the importance of diet, exercise, and its role in overall health and mortality. The patient's FH and SH was reviewed, including their home life, tobacco status, and drug and alcohol status.     Colon: 11/2021, plan repeat yearly Nonsmoker DEXA:  Normal 1 11/2022 Mammogram last 12/2022 left breast calcifications.. plan repeat 6 months. Mom with breast cancer PAP not indicated, DVE not indicated total hysterecotmy Vaccine: uptodate  flu, COVID19  series , Tdap,  Shingrix and PNA. Hep C neg   Problem List Items Addressed This Visit     Hyperlipidemia associated with type 2 diabetes mellitus (HCC)    Stable, chronic.  Continue current medication.    Crestor 10 mg daily      Hypertension associated with diabetes (HCC)    Stable, chronic.  Continue current medication.   Losartan 25 mg p.o. daily Metoprolol XL 50 mg p.o. daily      Hypothyroidism    Stable, chronic.  Continue current medication.  Synthroid 75 mcg daily      OBSTRUCTIVE SLEEP APNEA    Now on CPAP.Marland Kitchen needs titration.      Type 2 diabetes mellitus with other circulatory complications HTN (HCC)    Stable, chronic.   Diet controlled.    No longer on metformin BID. Followed by Dr. Talmage Nap.      Relevant Orders   Microalbumin/Creatinine Ratio, Urine   Other Visit Diagnoses     Routine general medical examination at a health care facility    -  Primary          Kerby Nora, MD

## 2023-04-14 NOTE — Assessment & Plan Note (Signed)
Stable, chronic.  Continue current medication.   Crestor 10 mg daily 

## 2023-04-14 NOTE — Assessment & Plan Note (Signed)
Now on CPAP.Marland Kitchen needs titration.

## 2023-04-26 ENCOUNTER — Other Ambulatory Visit: Payer: Self-pay | Admitting: Family Medicine

## 2023-04-27 NOTE — Telephone Encounter (Signed)
Not given by you in the past ok to refill?

## 2023-05-01 DIAGNOSIS — L650 Telogen effluvium: Secondary | ICD-10-CM | POA: Diagnosis not present

## 2023-05-15 ENCOUNTER — Encounter: Payer: Self-pay | Admitting: Cardiovascular Disease

## 2023-05-15 NOTE — Telephone Encounter (Signed)
Please send the requested referral

## 2023-05-23 DIAGNOSIS — R7989 Other specified abnormal findings of blood chemistry: Secondary | ICD-10-CM | POA: Diagnosis not present

## 2023-05-23 DIAGNOSIS — E039 Hypothyroidism, unspecified: Secondary | ICD-10-CM | POA: Diagnosis not present

## 2023-05-23 DIAGNOSIS — E1165 Type 2 diabetes mellitus with hyperglycemia: Secondary | ICD-10-CM | POA: Diagnosis not present

## 2023-05-23 DIAGNOSIS — I10 Essential (primary) hypertension: Secondary | ICD-10-CM | POA: Diagnosis not present

## 2023-05-25 DIAGNOSIS — K080 Exfoliation of teeth due to systemic causes: Secondary | ICD-10-CM | POA: Diagnosis not present

## 2023-05-25 DIAGNOSIS — H524 Presbyopia: Secondary | ICD-10-CM | POA: Diagnosis not present

## 2023-05-31 DIAGNOSIS — Z1211 Encounter for screening for malignant neoplasm of colon: Secondary | ICD-10-CM | POA: Diagnosis not present

## 2023-05-31 DIAGNOSIS — K5730 Diverticulosis of large intestine without perforation or abscess without bleeding: Secondary | ICD-10-CM | POA: Diagnosis not present

## 2023-05-31 DIAGNOSIS — K635 Polyp of colon: Secondary | ICD-10-CM | POA: Diagnosis not present

## 2023-05-31 DIAGNOSIS — D122 Benign neoplasm of ascending colon: Secondary | ICD-10-CM | POA: Diagnosis not present

## 2023-05-31 DIAGNOSIS — Z860101 Personal history of adenomatous and serrated colon polyps: Secondary | ICD-10-CM | POA: Diagnosis not present

## 2023-05-31 DIAGNOSIS — D126 Benign neoplasm of colon, unspecified: Secondary | ICD-10-CM | POA: Diagnosis not present

## 2023-05-31 DIAGNOSIS — D120 Benign neoplasm of cecum: Secondary | ICD-10-CM | POA: Diagnosis not present

## 2023-06-10 ENCOUNTER — Other Ambulatory Visit: Payer: Self-pay | Admitting: Family Medicine

## 2023-06-12 ENCOUNTER — Other Ambulatory Visit (HOSPITAL_COMMUNITY)
Admission: RE | Admit: 2023-06-12 | Discharge: 2023-06-12 | Disposition: A | Discharge status: Home / Self Care | Payer: Medicare Other | Source: Ambulatory Visit | Attending: Oncology | Admitting: Oncology

## 2023-06-12 DIAGNOSIS — Z006 Encounter for examination for normal comparison and control in clinical research program: Secondary | ICD-10-CM | POA: Insufficient documentation

## 2023-06-20 DIAGNOSIS — L720 Epidermal cyst: Secondary | ICD-10-CM | POA: Diagnosis not present

## 2023-06-20 DIAGNOSIS — G4733 Obstructive sleep apnea (adult) (pediatric): Secondary | ICD-10-CM | POA: Diagnosis not present

## 2023-06-20 DIAGNOSIS — Z6824 Body mass index (BMI) 24.0-24.9, adult: Secondary | ICD-10-CM | POA: Diagnosis not present

## 2023-06-25 LAB — GENECONNECT MOLECULAR SCREEN: Genetic Analysis Overall Interpretation: NEGATIVE

## 2023-07-04 ENCOUNTER — Other Ambulatory Visit: Payer: Self-pay | Admitting: Family Medicine

## 2023-07-04 ENCOUNTER — Ambulatory Visit
Admission: RE | Admit: 2023-07-04 | Discharge: 2023-07-04 | Disposition: A | Discharge status: Home / Self Care | Payer: Medicare Other | Source: Ambulatory Visit | Attending: Family Medicine | Admitting: Family Medicine

## 2023-07-04 DIAGNOSIS — R921 Mammographic calcification found on diagnostic imaging of breast: Secondary | ICD-10-CM

## 2023-07-05 DIAGNOSIS — L720 Epidermal cyst: Secondary | ICD-10-CM | POA: Diagnosis not present

## 2023-07-20 ENCOUNTER — Encounter: Payer: Self-pay | Admitting: Family Medicine

## 2023-07-20 DIAGNOSIS — Z803 Family history of malignant neoplasm of breast: Secondary | ICD-10-CM

## 2023-07-25 ENCOUNTER — Encounter: Payer: Self-pay | Admitting: Family Medicine

## 2023-07-25 NOTE — Progress Notes (Signed)
 Valerie Vargas.. pt also just messaged me and told me she has been scheduled. I let her know that I was not sure the cost and asked her to verify with imaging bifer they did the test.

## 2023-07-27 NOTE — Progress Notes (Signed)
 Notes are in the Referral Order  This test is $399 out of pocket, self pay Patient was made aware   Nothing further needed.

## 2023-07-31 ENCOUNTER — Ambulatory Visit
Admission: RE | Admit: 2023-07-31 | Discharge: 2023-07-31 | Disposition: A | Discharge status: Home / Self Care | Payer: No Typology Code available for payment source | Source: Ambulatory Visit | Attending: Family Medicine | Admitting: Family Medicine

## 2023-07-31 DIAGNOSIS — Z803 Family history of malignant neoplasm of breast: Secondary | ICD-10-CM

## 2023-07-31 DIAGNOSIS — Z1231 Encounter for screening mammogram for malignant neoplasm of breast: Secondary | ICD-10-CM | POA: Diagnosis not present

## 2023-07-31 MED ORDER — GADOPICLENOL 0.5 MMOL/ML IV SOLN
6.0000 mL | Freq: Once | INTRAVENOUS | Status: AC | PRN
  Administered 2023-07-31: 6 mL via INTRAVENOUS

## 2023-08-04 DIAGNOSIS — H04123 Dry eye syndrome of bilateral lacrimal glands: Secondary | ICD-10-CM | POA: Diagnosis not present

## 2023-08-04 DIAGNOSIS — H1133 Conjunctival hemorrhage, bilateral: Secondary | ICD-10-CM | POA: Diagnosis not present

## 2023-08-08 DIAGNOSIS — H903 Sensorineural hearing loss, bilateral: Secondary | ICD-10-CM | POA: Diagnosis not present

## 2023-08-09 DIAGNOSIS — R490 Dysphonia: Secondary | ICD-10-CM | POA: Diagnosis not present

## 2023-08-09 DIAGNOSIS — G4733 Obstructive sleep apnea (adult) (pediatric): Secondary | ICD-10-CM | POA: Diagnosis not present

## 2023-08-09 DIAGNOSIS — H903 Sensorineural hearing loss, bilateral: Secondary | ICD-10-CM | POA: Diagnosis not present

## 2023-08-17 DIAGNOSIS — G4730 Sleep apnea, unspecified: Secondary | ICD-10-CM | POA: Diagnosis not present

## 2023-09-11 ENCOUNTER — Other Ambulatory Visit: Payer: Self-pay | Admitting: Family Medicine

## 2023-09-11 NOTE — Telephone Encounter (Signed)
 Last office visit 04/14/23 for CPE.  Last refilled 04/27/2023 for #[id number] with no refills.  Next Appt: No future appointments with PCP.

## 2023-09-20 ENCOUNTER — Encounter (HOSPITAL_BASED_OUTPATIENT_CLINIC_OR_DEPARTMENT_OTHER): Payer: Self-pay | Admitting: Pulmonary Disease

## 2023-09-20 ENCOUNTER — Ambulatory Visit (HOSPITAL_BASED_OUTPATIENT_CLINIC_OR_DEPARTMENT_OTHER): Payer: Medicare Other | Admitting: Pulmonary Disease

## 2023-09-20 VITALS — BP 118/72 | HR 82 | Ht 62.5 in | Wt 141.9 lb

## 2023-09-20 DIAGNOSIS — G4733 Obstructive sleep apnea (adult) (pediatric): Secondary | ICD-10-CM

## 2023-09-20 DIAGNOSIS — J383 Other diseases of vocal cords: Secondary | ICD-10-CM

## 2023-09-20 NOTE — Progress Notes (Signed)
 Subjective:    Patient ID: Valerie Vargas, female    DOB: 11-10-1945, 78 y.o.   MRN: [id number]  HPI  78 year old woman with known OSA on CPAP therapy presents to establish care. Specific she is referred by Dr. Jenne Pane , she has been scheduled for sleep endoscopy procedure in preparation for hypoglossal nerve stimulator device.  She has been maintained on CPAP therapy for several years without adequate follow-up.  She shares her Ring data with me which shows residual AHI of 16/hour and low saturation less than 88% for about 1 hour every night.  I reviewed this data for the last 3 months on her phone  78 year old with a history of severe sleep apnea, presents with concerns about dropping oxygen levels at night despite using a CPAP machine. She reports that she has been using the CPAP machine since 2008, but over the past couple of years, she has noticed a decrease in her oxygen levels. She has been monitoring her oxygen levels using a ring device, which has shown that her oxygen levels drop below 80% at times during the night. The patient also reports experiencing memory issues, which she believes may be related to her dropping oxygen levels.  In addition to her sleep apnea, the patient also has a history of sleepwalking. She reports that she often wakes up in the middle of the night to find that she has removed her CPAP machine. Despite these challenges, the patient is committed to using her CPAP machine and has tried various strategies to make it more comfortable, including using filters and cherry-type things in her mask.  The patient is interested in exploring other treatment options for her sleep apnea. She has been researching the [facility] implant and is interested in finding out if she would be a good candidate for this treatment.  PMH : Vocal cord dysfunction, she has been evaluated by Deland Pretty at [facility] and diagnosed with spasmodic dysphonia.  She is maintained on Xanax and Tessalon  Perles  She is currently using her husband's old CPAP machine that has been set to 14 cm and using his order supplies.  I have reviewed all the sleep studies dating back.  2005.  Epworth sleepiness score is 6.  Bedtime around 10:30 PM, sleep latency about 30 minutes, she wakes up around 8 AM  Significant tests/ events reviewed  HST/SNAP 07/2023 AHI 44/hour, low desat 77%  06/2018 CPAP titration>> 13 to 14 cm fullface mask  04/2018 NPSG>> AHI 6/hour, supine AHI 10/hour, low sat 78% -weight 132 pounds  01/2004 N PSG mild OSA, AHI 9/hour, low sat 87%   01/2011 PFTs normal  Past Medical History:  Diagnosis Date   Allergy, unspecified not elsewhere classified    Chronic interstitial cystitis    Cough    Esophageal reflux    H/O syncope 2007   No recurrence;Tilt table 09/23/05-negative   Hyperlipidemia 08/22/2014   Hypertension    Hypertriglyceridemia    Irritable bowel syndrome    Nonocclusive coronary atherosclerosis of native coronary artery    Obstructive sleep apnea (adult) (pediatric)    Paroxysmal ventricular tachycardia (HCC)    Type II or unspecified type diabetes mellitus without mention of complication, not stated as uncontrolled    Unspecified asthma(493.90)    Unspecified hypothyroidism    Ventricular tachycardia [facility])    Past Surgical History:  Procedure Laterality Date   ABDOMINAL HYSTERECTOMY  11/16/1979   for endometriosis   CHOLECYSTECTOMY  07/18/2000   ESOPHAGOGASTRODUODENOSCOPY ENDOSCOPY  07/18/2008  EYE SURGERY Left 01/30/2022   FACIAL NERVE SURGERY     12/2021    Allergies  Allergen Reactions   Elmiron [Pentosan Polysulfate Sodium] Anxiety    "I felt like I wanted to kill myself"   Hctz [Hydrochlorothiazide] Other (See Comments)    Unknown reaction   Penicillins Rash   Sulfa Antibiotics Rash    Social History   Socioeconomic History   Marital status: Married    Spouse name: Not on file   Number of children: Not on file   Years of education:  Not on file   Highest education level: Not on file  Occupational History   Occupation: retired  Tobacco Use   Smoking status: Never   Smokeless tobacco: Never  Vaping Use   Vaping status: Never Used  Substance and Sexual Activity   Alcohol use: No   Drug use: No   Sexual activity: Yes  Other Topics Concern   Not on file  Social History Narrative   Not on file   Social Drivers of Health   Financial Resource Strain: Low Risk  (12/28/2022)   Overall Financial Resource Strain (CARDIA)    Difficulty of Paying Living Expenses: Not hard at all  Food Insecurity: No Food Insecurity (12/28/2022)   Hunger Vital Sign    Worried About Running Out of Food in the Last Year: Never true    Ran Out of Food in the Last Year: Never true  Transportation Needs: No Transportation Needs (12/28/2022)   PRAPARE - Administrator, Civil Service (Medical): No    Lack of Transportation (Non-Medical): No  Physical Activity: Inactive (12/28/2022)   Exercise Vital Sign    Days of Exercise per Week: 0 days    Minutes of Exercise per Session: 0 min  Stress: No Stress Concern Present (12/28/2022)   [employer] of Occupational Health - Occupational Stress Questionnaire    Feeling of Stress : Not at all  Social Connections: Moderately Integrated (12/28/2022)   Social Connection and Isolation Panel [NHANES]    Frequency of Communication with Friends and Family: More than three times a week    Frequency of Social Gatherings with Friends and Family: More than three times a week    Attends Religious Services: More than 4 times per year    Active Member of [employer] or Organizations: No    Attends Banker Meetings: Never    Marital Status: Married  Catering manager Violence: Not At Risk (12/28/2022)   Humiliation, Afraid, Rape, and Kick questionnaire    Fear of Current or Ex-Partner: No    Emotionally Abused: No    Physically Abused: No    Sexually Abused: No    Family History  Problem  Relation Age of Onset   Diabetes Mother    Breast cancer Mother    Arthritis Mother    Diabetes Father    Stomach cancer Father    Heart disease Father    Leukemia Brother    Breast cancer Maternal Aunt    Breast cancer Paternal Aunt      Review of Systems  Constitutional: negative for anorexia, fevers and sweats  Eyes: negative for irritation, redness and visual disturbance  Ears, nose, mouth, throat, and face: negative for earaches, epistaxis, nasal congestion and sore throat  Respiratory: negative for cough, dyspnea on exertion, sputum and wheezing  Cardiovascular: negative for chest pain, dyspnea, lower extremity edema, orthopnea, palpitations and syncope  Gastrointestinal: negative for abdominal pain, constipation, diarrhea, melena, nausea  and vomiting  Genitourinary:negative for dysuria, frequency and hematuria  Hematologic/lymphatic: negative for bleeding, easy bruising and lymphadenopathy  Musculoskeletal:negative for arthralgias, muscle weakness and stiff joints  Neurological: negative for coordination problems, gait problems, headaches and weakness  Endocrine: negative for diabetic symptoms including polydipsia, polyuria and weight loss     Objective:   Physical Exam  Gen. Pleasant, well-nourished, in no distress, normal affect ENT - no pallor,icterus, no post nasal drip Neck: No JVD, no thyromegaly, no carotid bruits Lungs: no use of accessory muscles, no dullness to percussion, clear without rales or rhonchi  Cardiovascular: Rhythm regular, heart sounds  normal, no murmurs or gallops, no peripheral edema Abdomen: soft and non-tender, no hepatosplenomegaly, BS normal. Musculoskeletal: No deformities, no cyanosis or clubbing Neuro:  alert, non focal       Assessment & Plan:    Obstructive Sleep Apnea (OSA) Severe OSA with an AHI of 44 events/hour. Using CPAP since 2008, but recent sleep study and personal monitoring indicate nocturnal oxygen desaturation below  80%, even with CPAP. Reports fatigue and memory issues. Current CPAP may not be adequately titrated. Discussed the need to exhaust all CPAP options before considering Inspire therapy due to its lifelong implications. Potential need for BiPAP or supplemental oxygen if CPAP is inadequate. - Order in-lab sleep study to evaluate CPAP efficacy and titrate settings. - Consider switching to BiPAP if CPAP is inadequate. - Evaluate the need for supplemental oxygen during sleep. - Review CPAP machine card data to assess usage and settings. - Adjust CPAP settings or provide a new machine post-sleep study. - Reassess condition after implementing new settings or machine.  Potential CPAP Failure Possible CPAP failure due to improper titration or inability to keep the mask on. Reports waking up without the mask and has a history of sleepwalking. Current CPAP machine is several years old and was set up for her husband's settings. Emphasized the importance of proper titration and consistent use. - Review and analyze CPAP machine card data. - Adjust CPAP settings based on in-lab sleep study results. - Consider a new CPAP machine if current settings are inadequate.  Consideration for Inspire Therapy Inquired about Inspire therapy for OSA. Recommended exhausting all CPAP options first due to lifelong implications of Inspire. Vocal cord dysfunction may complicate Inspire use. Discussed the need for a DICE procedure as a prep for Inspire but recommended delaying it until CPAP efficacy is reassessed. - Delay DISE procedure until CPAP efficacy is reassessed. - Reevaluate the need for Inspire therapy after optimizing CPAP treatment.  Follow-up - Schedule in-lab sleep study within four weeks. - Adjust CPAP settings or provide a new machine by April. - Reassess condition and CPAP efficacy before the trip.

## 2023-09-20 NOTE — Patient Instructions (Signed)
 X Bring in card for download  X cpap titration study  Defer endoscopy for now (Dr Jenne Pane)

## 2023-09-22 ENCOUNTER — Telehealth: Payer: Self-pay | Admitting: Pulmonary Disease

## 2023-09-22 NOTE — Telephone Encounter (Signed)
 CPAP download was reviewed Good control of events on CPAP 14 cm.  She has a large leak and this may be the main issue-may have to obtain better mask seal/better fitting mask.  Pressure seems adequate Usage is about 4 hours on average.  No change in plan -proceed with CPAP titration study which will enable [place] to evaluate mask fitting and also need for oxygen

## 2023-09-22 NOTE — Telephone Encounter (Signed)
 Pt.notified

## 2023-09-26 ENCOUNTER — Other Ambulatory Visit: Payer: Self-pay | Admitting: Student

## 2023-10-02 ENCOUNTER — Other Ambulatory Visit: Payer: Self-pay | Admitting: Otolaryngology

## 2023-10-03 ENCOUNTER — Telehealth: Payer: Self-pay | Admitting: Family Medicine

## 2023-10-03 DIAGNOSIS — E1159 Type 2 diabetes mellitus with other circulatory complications: Secondary | ICD-10-CM

## 2023-10-03 NOTE — Telephone Encounter (Signed)
 MyChart message sent to patient regarding microalbumin

## 2023-10-04 ENCOUNTER — Telehealth: Payer: Self-pay | Admitting: Family Medicine

## 2023-10-04 NOTE — Telephone Encounter (Signed)
 Copied from CRM [id number]. Topic: General - Other >> Oct 04, 2023 10:33 AM Rodman Pickle T wrote: Reason for CRM: patient is calling in regarding the renewal of her handicapped sticker she wants to know if she needs to drop the form off are does she need mail it in she would like a call back regarding this

## 2023-10-04 NOTE — Telephone Encounter (Signed)
 Spoke with pt. Advised to bring the form by the office so that way it won't get lost in the mail. She verbalized understanding.

## 2023-10-06 ENCOUNTER — Telehealth: Payer: Self-pay | Admitting: *Deleted

## 2023-10-06 NOTE — Telephone Encounter (Signed)
 Copied from CRM [id number]. Topic: Appointments - Appointment Scheduling >> Oct 06, 2023  8:55 AM [place] A wrote: Patient called because she received message in MyCHart to have Urine Test performed- Patient would like to know if she has to fast for that test

## 2023-10-06 NOTE — Telephone Encounter (Signed)
 Patient notified that we will just be collecting a urine sample and no blood work so she does not have to fast.  Patient states understanding.

## 2023-10-11 ENCOUNTER — Other Ambulatory Visit (INDEPENDENT_AMBULATORY_CARE_PROVIDER_SITE_OTHER)

## 2023-10-11 ENCOUNTER — Telehealth: Payer: Self-pay | Admitting: Family Medicine

## 2023-10-11 ENCOUNTER — Ambulatory Visit: Payer: Medicare Other | Attending: Cardiovascular Disease | Admitting: Cardiovascular Disease

## 2023-10-11 ENCOUNTER — Encounter: Payer: Self-pay | Admitting: Cardiovascular Disease

## 2023-10-11 ENCOUNTER — Encounter: Payer: Self-pay | Admitting: Family Medicine

## 2023-10-11 VITALS — BP 125/70 | HR 73 | Ht 62.5 in | Wt 142.2 lb

## 2023-10-11 DIAGNOSIS — I10 Essential (primary) hypertension: Secondary | ICD-10-CM

## 2023-10-11 DIAGNOSIS — E1159 Type 2 diabetes mellitus with other circulatory complications: Secondary | ICD-10-CM

## 2023-10-11 DIAGNOSIS — G4733 Obstructive sleep apnea (adult) (pediatric): Secondary | ICD-10-CM

## 2023-10-11 DIAGNOSIS — I493 Ventricular premature depolarization: Secondary | ICD-10-CM

## 2023-10-11 DIAGNOSIS — E782 Mixed hyperlipidemia: Secondary | ICD-10-CM

## 2023-10-11 DIAGNOSIS — I4729 Other ventricular tachycardia: Secondary | ICD-10-CM

## 2023-10-11 DIAGNOSIS — R55 Syncope and collapse: Secondary | ICD-10-CM | POA: Diagnosis not present

## 2023-10-11 DIAGNOSIS — E236 Other disorders of pituitary gland: Secondary | ICD-10-CM

## 2023-10-11 DIAGNOSIS — I471 Supraventricular tachycardia, unspecified: Secondary | ICD-10-CM | POA: Diagnosis not present

## 2023-10-11 LAB — MICROALBUMIN / CREATININE URINE RATIO
Creatinine,U: 65.6 mg/dL
Microalb Creat Ratio: 42.9 mg/g — ABNORMAL HIGH (ref 0.0–30.0)
Microalb, Ur: 2.8 mg/dL — ABNORMAL HIGH (ref 0.0–1.9)

## 2023-10-11 MED ORDER — METOPROLOL SUCCINATE ER 50 MG PO TB24: ORAL_TABLET | ORAL | 3 refills | Status: DC

## 2023-10-11 MED ORDER — METOPROLOL SUCCINATE ER 50 MG PO TB24: 50.0000 mg | ORAL_TABLET | Freq: Two times a day (BID) | ORAL | 3 refills | Status: DC

## 2023-10-11 NOTE — Patient Instructions (Signed)

## 2023-10-11 NOTE — Progress Notes (Signed)
 Patient ID: Valerie Vargas, female   DOB: 1945-08-20, 78 y.o.   MRN: [id number]    Cardiology Office Note    Date:  10/18/2023   ID:  Valerie, Vargas 1945-12-19, MRN [id number]  PCP:  Excell Seltzer, MD  Cardiologist:   Thurmon Fair, MD   Chief Complaint  Patient presents with   Palpitations    History of Present Illness:  Valerie Vargas is a 78 y.o. female with OSA, a remote history of syncope in 2007, that recurred following a hot shower in 2022 , minor coronary artery disease by angiography in 2003, hypertension, hyperlipidemia, type 2 diabetes mellitus, neurally mediated syncope, SVT.    She presented in SVT at a rate of 142 bpm with prominent retrograde P waves at her previous appointment, suggesting junctional tachycardia or atypical AVNRT.  The arrhythmia resolved and extra dose of beta-blocker and no recurrence was seen where she wore an event monitor.  She has not had any new episodes of sustained SVT and in general her palpitations have not been very bothersome.  She denies exertional dyspnea orthopnea PND or lower extremity edema.  She has not had any chest pain.  Blood pressure was initially high when she checked in but was much better when I rechecked it later and is usually normal at home.  She has not had any episodes of syncope or near syncope (in the past she has had a full syncopal event with head injury and another two near syncopal events after taking warm showers; had another remote syncopal event 15 years ago).  A home sleep study described as having remarkably severe OSA with an AHI of 38.7, but Dr. Vassie Loll has expressed skepticism and the accuracy of the test and has ordered a full conventional sleep study.  She has normal renal function.  She has good glycemic control with a hemoglobin A1c of 6.6%.  Cholesterol parameters are satisfactory with an HDL of 49 and LDL of 76, but her triglycerides were quite high at 511.  She has had vision problems related to a  macular hole in her dominant right eye (her left eye has problems due to a childhood injury).  Even after cataract surgery she still only has 20/80 vision and she has been avoiding driving.  She has seen a retina specialist.  She was incidentally found to have empty sella syndrome when she had her CT for head injury in 2022.  In the past a tilt table test demonstrated that she had orthostatic hypotension. Normal Vascuscreen (minimal carotid plaque, normal ABI, no AAA) in May 2018.    Past Medical History:  Diagnosis Date   Allergy, unspecified not elsewhere classified    Chronic interstitial cystitis    Cough    Esophageal reflux    H/O syncope 2007   No recurrence;Tilt table 09/23/05-negative   Hyperlipidemia 08/22/2014   Hypertension    Hypertriglyceridemia    Irritable bowel syndrome    Nonocclusive coronary atherosclerosis of native coronary artery    Obstructive sleep apnea (adult) (pediatric)    Paroxysmal ventricular tachycardia (HCC)    Type II or unspecified type diabetes mellitus without mention of complication, not stated as uncontrolled    Unspecified asthma(493.90)    Unspecified hypothyroidism    Ventricular tachycardia [facility])     Past Surgical History:  Procedure Laterality Date   ABDOMINAL HYSTERECTOMY  11/16/1979   for endometriosis   CHOLECYSTECTOMY  07/18/2000   ESOPHAGOGASTRODUODENOSCOPY ENDOSCOPY  07/18/2008   EYE SURGERY  Left 01/30/2022   FACIAL NERVE SURGERY     12/2021    Outpatient Medications Prior to Visit  Medication Sig Dispense Refill   ALPRAZolam (XANAX) 0.5 MG tablet Take 1 tablet (0.5 mg total) by mouth daily as needed. 5 tablet 0   Ascorbic Acid (VITAMIN C) 1000 MG tablet Take 1,000 mg by mouth daily.     benzonatate (TESSALON) 100 MG capsule TAKE TWO CAPSULES BY MOUTH 3 TIMES A DAYAS NEEDED FOR COUGH 60 capsule 1   blood glucose meter kit and supplies KIT Dispense based on patient and insurance preference. Use up to four times daily as directed.  (FOR ICD-9 250.00, 250.01). 1 each 0   Cholecalciferol (VITAMIN D3) 1000 UNITS CAPS Take 1 capsule by mouth daily.     Cranberry 200 MG CAPS Take by mouth daily in the afternoon.     diphenhydrAMINE-PE-APAP 12.5-5-325 MG TABS Take 1 tablet by mouth at bedtime as needed.     gabapentin (NEURONTIN) 100 MG capsule TAKE 3 CAPSULES BY MOUTH 3 TIMES DAILY 270 capsule 0   glucose blood (ONE TOUCH ULTRA TEST) test strip -TEST TWICE DAILY 100 each 6   Guaifenesin 1200 MG TB12 Take by mouth as needed.     levothyroxine (SYNTHROID, LEVOTHROID) 75 MCG tablet Take 75 mcg by mouth daily before breakfast. Brand Synthroid     loratadine (CLARITIN) 10 MG tablet Take 10 mg by mouth daily.     OMEGA-3 1000 MG CAPS Take 1,000 mg by mouth daily.     ONETOUCH DELICA LANCETS 33G MISC -TEST TWICE DAILY 100 each 5   Pyridoxine HCl (VITAMIN B-6 PO) Take 1 tablet by mouth daily.     rosuvastatin (CRESTOR) 10 MG tablet TAKE 1 TABLET BY MOUTH EVERY DAY FOR CHOLESTEROL 90 tablet 3   Thiamine HCl (VITAMIN B-1) 250 MG tablet Take 250 mg by mouth daily.     vitamin B-12 (CYANOCOBALAMIN) 1000 MCG tablet Take 1,000 mcg by mouth daily.     metoprolol succinate (TOPROL-XL) 50 MG 24 hr tablet Take 50 mg by mouth 2 (two) times daily. Pt takes 2 tablets,100 mg in the morning, and 1 tablet 50 mg at night.     metoprolol succinate (TOPROL-XL) 50 MG 24 hr tablet TAKE 1 TABLET BY MOUTH EVERY MORNING (IMMEDIATELY FOLLOWING A MEAL) & TAKE 2 TABLETS NIGHTLY (Patient not taking: Reported on 10/11/2023) 90 tablet 3   No facility-administered medications prior to visit.     Allergies:   Elmiron [pentosan polysulfate sodium], Hctz [hydrochlorothiazide], Penicillins, and Sulfa antibiotics   Family History:  The patient's family history includes Arthritis in her mother; Breast cancer in her maternal aunt, mother, and paternal aunt; Diabetes in her father and mother; Heart disease in her father; Leukemia in her brother; Stomach cancer in her  father.   PHYSICAL EXAM:   VS:  BP 125/70   Pulse 73   Ht 5' 2.5" (1.588 m)   Wt 142 lb 3.2 oz (64.5 kg)   SpO2 97%   BMI 25.59 kg/m      General: Alert, oriented x3, no distress, appears lean and fit Head: no evidence of trauma, PERRL, EOMI, no exophtalmos or lid lag, no myxedema, no xanthelasma; normal ears, nose and oropharynx Neck: normal jugular venous pulsations and no hepatojugular reflux; brisk carotid pulses without delay and no carotid bruits Chest: clear to auscultation, no signs of consolidation by percussion or palpation, normal fremitus, symmetrical and full respiratory excursions Cardiovascular: normal position and quality of  the apical impulse, regular rhythm, normal first and second heart sounds, no murmurs, rubs or gallops Abdomen: no tenderness or distention, no masses by palpation, no abnormal pulsatility or arterial bruits, normal bowel sounds, no hepatosplenomegaly Extremities: no clubbing, cyanosis or edema; 2+ radial, ulnar and brachial pulses bilaterally; 2+ right femoral, posterior tibial and dorsalis pedis pulses; 2+ left femoral, posterior tibial and dorsalis pedis pulses; no subclavian or femoral bruits Neurological: grossly nonfocal Psych: Normal mood and affect     Wt Readings from Last 3 Encounters:  10/11/23 142 lb 3.2 oz (64.5 kg)  09/20/23 141 lb 14.4 oz (64.4 kg)  04/14/23 142 lb 6.4 oz (64.6 kg)     Studies/Labs Reviewed:   EKG:    EKG Interpretation Date/Time:  Wednesday October 11 2023 11:58:29 EDT Ventricular Rate:  73 PR Interval:  170 QRS Duration:  72 QT Interval:  430 QTC Calculation: 473 R Axis:   25  Text Interpretation: Sinus rhythm with frequent Premature ventricular complexes When compared with ECG of 03-Apr-2023 11:32, Premature ventricular complexes are now Present Confirmed by Carmilla Granville [phone]) on 10/11/2023 12:13:49 PM        ECG at her previous appointment shows supraventricular tachycardia with frequent PVCs (2  morphologies are seen).  Retrograde P waves are seen following the narrow complex QRS, suggesting junctional tachycardia or uncommon AV node reentry.  There may be ST segment depression in the inferolateral leads, but it is hard to say because of the large negative P waves overall at the ST segment.  Recent Labs:  03/19/2020 (Dr. Talmage Nap) hemoglobin A1c of 6.6%, fasting glucose 109  mild residual hypertriglyceridemia (even this is improved at 164).  HDL 49, LDL 55, total cholesterol 604.  04/08/2022 Cholesterol 185, HDL 65, LDL 99, triglycerides 105 Hemoglobin A1c 6.0% Hemoglobin 12.6, creatinine 0.95, potassium 5.0, ALT 13, TSH 1.15  02/27/2023 Cholesterol 205, HDL 49, LDL 76, triglycerides 511 Hemoglobin A1c 6.6% Creatinine 0.8, Potassium 4.8, ALT 19, TSH 1.12   Lipid Panel    Component Value Date/Time   CHOL 205 (H) 02/27/2023 0943   TRIG 511 (H) 02/27/2023 0943   HDL 49 02/27/2023 0943   CHOLHDL 4.2 02/27/2023 0943   CHOLHDL 3 04/08/2022 0901   VLDL 21.0 04/08/2022 0901   LDLCALC 76 02/27/2023 0943   LDLDIRECT 70.0 02/09/2018 1033    ASSESSMENT:    1. SVT (supraventricular tachycardia) (HCC)   2. PVCs (premature ventricular contractions)   3. Vasovagal syncope   4. Primary hypertension   5. Mixed hyperlipidemia   6. Type 2 diabetes mellitus with other circulatory complications HTN (HCC)   7. OSA (obstructive sleep apnea)   8. Empty sella syndrome (HCC)      PLAN:  In order of problems listed above:  SVT: No clinical recurrence short R-P tachycardia. Very prominent negative P waves well beyond the QRS. This could represent junctional tachycardia which would be unusual at her age or slow slow AVNRT.  Vagal maneuvers and carotid sinus compression had no impact in the office.  She was on mildly symptomatic and the arrhythmia resolved with an extra dose of beta-blocker.  We did not see any recurrence on a 2-week arrhythmia monitor and she has not seen tachycardia on her  Fitbit. PVCs: When she wore the monitor, these were responsible for her palpitations. Syncope: In her lifetime so far has had 2 episodes of syncope and at least 2 other episodes of near syncope that all had characteristic suggestive of neurally mediated/vasovagal events. Elevated  BP: Situational.  Normalized when we rechecked a few minutes later. HLP: Triglycerides are higher than usual (500 versus for her usual 200) so I wonder whether she was truly fasting when the blood was drawn.  Otherwise, cholesterol parameters are in target range.  Continue rosuvastatin. DM: Very good glycemic control.  This cannot be incremented as a major cause of her hypertriglyceridemia. OSA: Being reevaluated by Dr. Cyril Mourning.  If it is confirmed I think she is a good candidate for an inspire device since she is lean Empty sella sd.: Dr. Talmage Nap is her endocrinologist.    Medication Adjustments/Labs and Tests Ordered: Current medicines are reviewed at length with the patient today.  Concerns regarding medicines are outlined above.  Medication changes, Labs and Tests ordered today are listed in the Patient Instructions below. Patient Instructions  Medication Instructions:  No changes *If you need a refill on your cardiac medications before your next appointment, please call your pharmacy*  Follow-Up: At [facility], you and your health needs are our priority.  As part of our continuing mission to provide you with exceptional heart care, we have created designated Provider Care Teams.  These Care Teams include your primary Cardiologist (physician) and Advanced Practice Providers (APPs -  Physician Assistants and Nurse Practitioners) who all work together to provide you with the care you need, when you need it.  We recommend signing up for the patient portal called "MyChart".  Sign up information is provided on this After Visit Summary.  MyChart is used to connect with patients for Virtual Visits  (Telemedicine).  Patients are able to view lab/test results, encounter notes, upcoming appointments, etc.  Non-urgent messages can be sent to your provider as well.   To learn more about what you can do with MyChart, go to [email or web address].    Your next appointment:   1 year(s)  Provider:   Thurmon Fair, MD               Signed, Thurmon Fair, MD  10/18/2023 3:51 PM    [facility] [address] [place], [place], [place]  [address] Phone: [phone]; Fax: [phone]

## 2023-10-11 NOTE — Telephone Encounter (Signed)
 Patient dropped off ppwk for provider to complete for handicap placecard placed in providers box at front desk

## 2023-10-11 NOTE — Telephone Encounter (Signed)
 Handicap Placard Application placed in Dr. Daphine Deutscher office in box for signature.

## 2023-10-12 ENCOUNTER — Ambulatory Visit (HOSPITAL_COMMUNITY): Admission: RE | Admit: 2023-10-12 | Source: Home / Self Care | Admitting: Otolaryngology

## 2023-10-12 ENCOUNTER — Encounter (HOSPITAL_COMMUNITY): Admission: RE | Payer: Self-pay | Source: Home / Self Care

## 2023-10-12 SURGERY — DRUG INDUCED SLEEP ENDOSCOPY
Anesthesia: General | Laterality: Bilateral

## 2023-10-18 NOTE — Telephone Encounter (Signed)
 Valerie Vargas schedule an office visit on 10/20/2023 at 2:20 pm to discuss this in person.

## 2023-10-19 NOTE — Telephone Encounter (Signed)
 Will have patient keep the appointment.  Can do A1c at that time.  Agree with likely starting Jardiance 10 mg daily.

## 2023-10-20 ENCOUNTER — Ambulatory Visit: Admitting: Family Medicine

## 2023-10-20 ENCOUNTER — Encounter: Payer: Self-pay | Admitting: Family Medicine

## 2023-10-20 VITALS — BP 149/78 | HR 74 | Temp 98.3°F | Ht 62.5 in | Wt 144.2 lb

## 2023-10-20 DIAGNOSIS — M79605 Pain in left leg: Secondary | ICD-10-CM

## 2023-10-20 DIAGNOSIS — M79604 Pain in right leg: Secondary | ICD-10-CM

## 2023-10-20 DIAGNOSIS — G4733 Obstructive sleep apnea (adult) (pediatric): Secondary | ICD-10-CM | POA: Diagnosis not present

## 2023-10-20 DIAGNOSIS — M545 Low back pain, unspecified: Secondary | ICD-10-CM | POA: Diagnosis not present

## 2023-10-20 DIAGNOSIS — R809 Proteinuria, unspecified: Secondary | ICD-10-CM | POA: Diagnosis not present

## 2023-10-20 DIAGNOSIS — E1159 Type 2 diabetes mellitus with other circulatory complications: Secondary | ICD-10-CM | POA: Diagnosis not present

## 2023-10-20 LAB — POCT GLYCOSYLATED HEMOGLOBIN (HGB A1C): Hemoglobin A1C: 6.4 % — AB (ref 4.0–5.6)

## 2023-10-20 MED ORDER — EMPAGLIFLOZIN 10 MG PO TABS: 10.0000 mg | ORAL_TABLET | Freq: Every day | ORAL | 11 refills | Status: DC

## 2023-10-20 NOTE — Assessment & Plan Note (Signed)
 She feels o2 may be dropping at night. USing CPAP for PSA.  Referred to ENT Dr. Jenne Pane by Dr. Royann Shivers.. for Inspire  Had home study.  Referred to Dr, Vassie [facility]oll [facility].. recommended CPAP with oxygen.

## 2023-10-20 NOTE — Progress Notes (Signed)
 Patient ID: SHAKERIA OPIE, female    DOB: 07-26-1945, 78 y.o.   MRN: [id number]  This visit was conducted in person.  BP (!) 149/78 (BP Location: Left Arm, Patient Position: Sitting, Cuff Size: Normal)   Pulse 74   Temp 98.3 F (36.8 C) (Temporal)   Ht 5' 2.5" (1.588 m)   Wt 144 lb 4 oz (65.4 kg)   SpO2 96%   BMI 25.96 kg/m    CC:  Chief Complaint  Patient presents with   Diabetes    Discuss Microalbumin Results    Subjective:   HPI: LETIZIA BOXLEY is a 78 y.o. female presenting on 10/20/2023 for Diabetes (Discuss Microalbumin Results)   Abnormal microalbumin results due to lab error.  On retest.. MALB again high.  BP well controlled  at hoe with metoprolol   but high in office today.  Cardiology DX SVT, PSVT.Aaron Aas recent increase in metoprolol . No recent passing out spell. BP Readings from Last 3 Encounters:  10/20/23 (!) 149/78  10/11/23 125/70  09/20/23 118/72    DM well controlled but pt not on SGLT2i.. diet controlled. Lab Results  Component Value Date   HGBA1C 6.4 (A) 10/20/2023     She feels O2 may be dropping at night. Using CPAP for OSa. Feeling foggy brained.  Referred to ENT Dr. Tellis Feathers by Dr. Alvis Ba.. for Inspire  Had home study.  Referred to Dr, [facility].. recommended CPAP with oxygen .  She has been noting pain in left buttok and left leg... chronic but gradually worse.  Legs feel more tired quicker.  Doing home PT.        Relevant past medical, surgical, family and social history reviewed and updated as indicated. Interim medical history since our last visit reviewed. Allergies and medications reviewed and updated. Outpatient Medications Prior to Visit  Medication Sig Dispense Refill   ALPRAZolam  (XANAX ) 0.5 MG tablet Take 1 tablet (0.5 mg total) by mouth daily as needed. 5 tablet 0   Ascorbic Acid (VITAMIN C) 1000 MG tablet Take 1,000 mg by mouth daily.     benzonatate  (TESSALON ) 100 MG capsule TAKE TWO CAPSULES BY MOUTH 3 TIMES A  DAYAS NEEDED FOR COUGH 60 capsule 1   blood glucose meter kit and supplies KIT Dispense based on patient and insurance preference. Use up to four times daily as directed. (FOR ICD-9 250.00, 250.01). 1 each 0   Cholecalciferol (VITAMIN D3) 1000 UNITS CAPS Take 1 capsule by mouth daily.     Cranberry 200 MG CAPS Take by mouth daily in the afternoon.     diphenhydrAMINE-PE-APAP 12.5-5-325 MG TABS Take 1 tablet by mouth at bedtime as needed.     gabapentin (NEURONTIN) 100 MG capsule TAKE 3 CAPSULES BY MOUTH 3 TIMES DAILY 270 capsule 0   glucose blood (ONE TOUCH ULTRA TEST) test strip -TEST TWICE DAILY 100 each 6   Guaifenesin  1200 MG TB12 Take by mouth as needed.     loratadine (CLARITIN) 10 MG tablet Take 10 mg by mouth daily.     metoprolol  succinate (TOPROL -XL) 50 MG 24 hr tablet Take 2 tablets( 100 mg )by mouth in the morning, and 1 tablet by mouth (50 mg ) at night.     OMEGA-3 1000 MG CAPS Take 1,000 mg by mouth daily.     ONETOUCH DELICA LANCETS 33G MISC -TEST TWICE DAILY 100 each 5   Pyridoxine HCl (VITAMIN B-6 PO) Take 1 tablet by mouth daily.     rosuvastatin  (CRESTOR ) 10  MG tablet TAKE 1 TABLET BY MOUTH EVERY DAY FOR CHOLESTEROL 90 tablet 3   SYNTHROID 75 MCG tablet Take 75 mcg by mouth daily before breakfast.     Thiamine HCl (VITAMIN B-1) 250 MG tablet Take 250 mg by mouth daily.     vitamin B-12 (CYANOCOBALAMIN) 1000 MCG tablet Take 1,000 mcg by mouth daily.     levothyroxine (SYNTHROID, LEVOTHROID) 75 MCG tablet Take 75 mcg by mouth daily before breakfast. Brand Synthroid     metoprolol  succinate (TOPROL -XL) 50 MG 24 hr tablet Take 1 tablet (50 mg total) by mouth 2 (two) times daily. Pt takes 2 tablets,100 mg in the morning, and 1 tablet 50 mg at night. (Patient taking differently: daily. Take 2 tablets( 100 mg )by mouth in the morning, and 1 tablet by mouth (50 mg ) at night.) 270 tablet 3   No facility-administered medications prior to visit.     Per HPI unless specifically  indicated in ROS section below Review of Systems  Constitutional:  Positive for fatigue. Negative for fever.  HENT:  Negative for congestion.   Eyes:  Negative for pain.  Respiratory:  Negative for cough and shortness of breath.   Cardiovascular:  Negative for chest pain, palpitations and leg swelling.  Gastrointestinal:  Negative for abdominal pain.  Genitourinary:  Negative for dysuria and vaginal bleeding.  Musculoskeletal:  Positive for back pain.  Neurological:  Negative for syncope, light-headedness and headaches.  Psychiatric/Behavioral:  Negative for dysphoric mood.    Objective:  BP (!) 149/78 (BP Location: Left Arm, Patient Position: Sitting, Cuff Size: Normal)   Pulse 74   Temp 98.3 F (36.8 C) (Temporal)   Ht 5' 2.5" (1.588 m)   Wt 144 lb 4 oz (65.4 kg)   SpO2 96%   BMI 25.96 kg/m   Wt Readings from Last 3 Encounters:  11/01/23 143 lb (64.9 kg)  10/20/23 144 lb 4 oz (65.4 kg)  10/11/23 142 lb 3.2 oz (64.5 kg)      Physical Exam Constitutional:      General: She is not in acute distress.    Appearance: Normal appearance. She is well-developed. She is not ill-appearing or toxic-appearing.  HENT:     Head: Normocephalic.     Right Ear: Hearing, tympanic membrane, ear canal and external ear normal. Tympanic membrane is not erythematous, retracted or bulging.     Left Ear: Hearing, tympanic membrane, ear canal and external ear normal. Tympanic membrane is not erythematous, retracted or bulging.     Nose: No mucosal edema or rhinorrhea.     Right Sinus: No maxillary sinus tenderness or frontal sinus tenderness.     Left Sinus: No maxillary sinus tenderness or frontal sinus tenderness.     Mouth/Throat:     Pharynx: Uvula midline.  Eyes:     General: Lids are normal. Lids are everted, no foreign bodies appreciated.     Conjunctiva/sclera: Conjunctivae normal.     Pupils: Pupils are equal, round, and reactive to light.  Neck:     Thyroid : No thyroid  mass or  thyromegaly.     Vascular: No carotid bruit.     Trachea: Trachea normal.  Cardiovascular:     Rate and Rhythm: Normal rate and regular rhythm.     Pulses: Normal pulses.     Heart sounds: Normal heart sounds, S1 normal and S2 normal. No murmur heard.    No friction rub. No gallop.  Pulmonary:     Effort: Pulmonary effort is  normal. No tachypnea or respiratory distress.     Breath sounds: Normal breath sounds. No decreased breath sounds, wheezing, rhonchi or rales.  Abdominal:     General: Bowel sounds are normal.     Palpations: Abdomen is soft.     Tenderness: There is no abdominal tenderness.  Musculoskeletal:     Cervical back: Normal range of motion and neck supple.  Skin:    General: Skin is warm and dry.     Findings: No rash.  Neurological:     Mental Status: She is alert.  Psychiatric:        Mood and Affect: Mood is not anxious or depressed.        Speech: Speech normal.        Behavior: Behavior normal. Behavior is cooperative.        Thought Content: Thought content normal.        Judgment: Judgment normal.       Results for orders placed or performed in visit on 10/20/23  POCT glycosylated hemoglobin (Hb A1C)   Collection Time: 10/20/23  2:39 PM  Result Value Ref Range   Hemoglobin A1C 6.4 (A) 4.0 - 5.6 %   HbA1c POC (<> result, manual entry)     HbA1c, POC (prediabetic range)     HbA1c, POC (controlled diabetic range)      Assessment and Plan  Type 2 diabetes mellitus with other circulatory complications (HCC) Assessment & Plan: Chronic, associated with hypertension Stable.   Diet controlled.    No longer on metformin BID. Followed by Dr. Ronelle Coffee in past.  Orders: -     POCT glycosylated hemoglobin (Hb A1C)  OBSTRUCTIVE SLEEP APNEA Assessment & Plan: She feels o2 may be dropping at night. USing CPAP for PSA.  Referred to ENT Dr. Tellis Feathers by Dr. Alvis Ba.. for Inspire  Had home study.  Referred to Dr, [facility].. recommended CPAP with  oxygen .    Microalbuminuria Assessment & Plan: New finding, she is not on SGLT2 inhibitor or ACE/ARB. Will start with initiation of Jardiance  10 mg p.o. daily. Reviewed significant cardiovascular and renal benefits of Jardiance .  Reviewed possible side effect profile and need for her to be aware of possible urinary tract infection general infection risk.   Low back pain radiating to both legs Assessment & Plan:  Chronic, feels like legs are more tired.  Lumbar radiculopathy symptoms on left, no overt weakness, no numbness. Will refer to PT.  Orders: -     Ambulatory referral to Physical Therapy  Other orders -     Empagliflozin ; Take 1 tablet (10 mg total) by mouth daily before breakfast.  Dispense: 30 tablet; Refill: 11    No follow-ups on file.   Herby Lolling, MD

## 2023-10-20 NOTE — Assessment & Plan Note (Signed)
 Chronic, feels like legs are more tired.  Lumbar radiculopathy symptoms on left, no overt weakness, no numbness. Will refer to PT.

## 2023-11-01 ENCOUNTER — Ambulatory Visit (HOSPITAL_BASED_OUTPATIENT_CLINIC_OR_DEPARTMENT_OTHER): Attending: Pulmonary Disease | Admitting: Pulmonary Disease

## 2023-11-01 DIAGNOSIS — G4733 Obstructive sleep apnea (adult) (pediatric): Secondary | ICD-10-CM | POA: Insufficient documentation

## 2023-11-10 ENCOUNTER — Telehealth: Payer: Self-pay | Admitting: Pulmonary Disease

## 2023-11-10 DIAGNOSIS — G4733 Obstructive sleep apnea (adult) (pediatric): Secondary | ICD-10-CM

## 2023-11-10 NOTE — Therapy (Addendum)
 OUTPATIENT PHYSICAL THERAPY THORACOLUMBAR EVALUATION / DISCHARGE SUMMARY   Patient Name: Valerie Vargas MRN: [id number] DOB:[birth year], 78 y.o., female Today's Date: 11/13/2023  END OF SESSION:  PT End of Session - 11/13/23 1312     Visit Number 1    Date for PT Re-Evaluation 01/08/24    Authorization Type [employer]    PT Start Time 1311    PT Stop Time 1405    PT Time Calculation (min) 54 min    Activity Tolerance Patient tolerated treatment well    Behavior During Therapy [facility] for tasks assessed/performed             Past Medical History:  Diagnosis Date   Allergy, unspecified not elsewhere classified    Chronic interstitial cystitis    Cough    Esophageal reflux    H/O syncope 2007   No recurrence;Tilt table 09/23/05-negative   Hyperlipidemia 08/22/2014   Hypertension    Hypertriglyceridemia    Irritable bowel syndrome    Nonocclusive coronary atherosclerosis of native coronary artery    Obstructive sleep apnea (adult) (pediatric)    Paroxysmal ventricular tachycardia (HCC)    Type II or unspecified type diabetes mellitus without mention of complication, not stated as uncontrolled    Unspecified asthma(493.90)    Unspecified hypothyroidism    Ventricular tachycardia (HCC)    Past Surgical History:  Procedure Laterality Date   ABDOMINAL HYSTERECTOMY  11/16/1979   for endometriosis   CHOLECYSTECTOMY  07/18/2000   ESOPHAGOGASTRODUODENOSCOPY ENDOSCOPY  07/18/2008   EYE SURGERY Left 01/30/2022   FACIAL NERVE SURGERY     12/2021   Patient Active Problem List   Diagnosis Date Noted   Microalbuminuria 10/20/2023   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   Empty sella (HCC) 06/04/2021   Injury of left facial nerve 06/04/2021   Mid back pain on left side 09/03/2020   Osteoarthritis, hand 01/22/2016   Left medial knee pain 01/22/2016   Low back pain 01/22/2016   Counseling regarding end of life decision making 11/06/2014   De Quervain's tenosynovitis, left  03/13/2014   Anemia, iron deficiency 08/15/2013   Hearing loss 08/15/2013   Hypertension associated with diabetes (HCC) 12/10/2012   CAD (coronary artery disease) 12/10/2012   Mixed hyperlipidemia 12/10/2012   Low back pain radiating to both legs 04/06/2012   Tinnitus of both ears 01/31/2012   Neurogenic syncope 07/14/2011   Carpal tunnel syndrome 03/17/2011   Hyperlipidemia associated with type 2 diabetes mellitus (HCC) 11/17/2010   Spasm larynx and swelling episodes 11/08/2010   Vocal cord dysfunction 11/08/2010   OTHER DISEASES OF VOCAL CORDS 11/08/2007   COUGH, chronic  07/02/2007   Hypothyroidism 06/05/2007   Type 2 diabetes mellitus with other circulatory complications HTN (HCC) 06/05/2007   OBSTRUCTIVE SLEEP APNEA 06/05/2007   HX of VENTRICULAR TACHYCARDIA 06/05/2007   Asthma 06/05/2007   IRRITABLE BOWEL SYNDROME 06/05/2007   INTERSTITIAL CYSTITIS 06/05/2007    PCP: Avelina Greig BRAVO, MD  REFERRING PROVIDER: Avelina Greig BRAVO, MD  REFERRING DIAG: M54.50,M79.604,M79.605 (ICD-10-CM) - Low back pain radiating to both legs  THERAPY DIAG:  Radiculopathy, lumbar region  Muscle weakness (generalized)  Unsteadiness on feet  Abnormal posture  Other muscle spasm  RATIONALE FOR EVALUATION AND TREATMENT: Rehabilitation  ONSET DATE: Chronic - few years  NEXT MD VISIT: 01/01/24   SUBJECTIVE:  SUBJECTIVE STATEMENT: L4-5 injury due to fall years ago. Started radiating down legs when getting up started a month ago. Went to chiropractor  this morning for an adjustment and feels better. When getting up from floor, it feels impossible to get up, feels flexibility in legs is not good as it used to be. Feels unstable or has issues with balance at times and feels like leg will give way. Inserts in shoes, R  is shorter than L  PERTINENT HISTORY:  H/O syncope, HTN, DM-II, Nonocclusive coronary atherosclerosis of native coronary artery, Obstructive sleep apnea (adult), Paroxysmal ventricular tachycardia (HCC)  PAIN:  Are you having pain? Yes: NPRS scale: 0/10 currently due to chiropractor this morning, 10/10 w/ movement Pain location: L4-5, radiates down both to ankle Pain description: starts as sharp then eases to ache Aggravating factors: Sitting down for an hr Relieving factors: Tylenol  PRECAUTIONS: None  RED FLAGS: Bowel or bladder incontinence: Yes: trouble with bowels   WEIGHT BEARING RESTRICTIONS: No  FALLS:  Has patient fallen in last 6 months? No  LIVING ENVIRONMENT: Lives with: lives with their family Lives in: House/apartment Stairs: No Has following equipment at home: Single point cane  OCCUPATION: Retired  PLOF: Independent and Leisure: Walking  PATIENT GOALS: Become agile, improve strength and balance, address pain  OBJECTIVE:  Note: Objective measures were completed at Evaluation unless otherwise noted.  DIAGNOSTIC FINDINGS:  02/23/2018 LUMBAR SPINE - COMPLETE 4+ VIEW  FINDINGS: Lumbar Spine:   Lumbar vertebral elements maintain normal alignment without evidence of anterolisthesis, retrolisthesis, subluxation.   No acute fracture line identified.   Vertebral body heights maintained.   Disc space narrowing of L5-S1 with endplate sclerosis.   Oblique images demonstrate no displaced pars defect. Facet hypertrophy advanced at the L4-L5 and L5-S1 levels. More mild facet hypertrophy at L2-L3 and L3-L4.   Unremarkable appearance of the visualized abdomen.   IMPRESSION: No acute fracture malalignment of the lumbar spine.   Disc space narrowing and facet hypertrophy at L5-S1 likely contributes to foraminal narrowing.    PATIENT SURVEYS:  Modified Oswestry 26/50 = 52% severe disability   COGNITION: Overall cognitive status: Within functional limits  for tasks assessed     SENSATION: Radiating pain down legs  MUSCLE LENGTH: Quads: WFL ITB: mild tightness  HS: mod tightness Hip extension: R=L mild tightness  POSTURE: rounded shoulders, forward head, flexed trunk , and shift to R  PALPATION: Tender on R paraspinals, R piriformis  LUMBAR ROM:   AROM eval  Flexion Above ankles  Extension WFL  Right lateral flexion Lat knee; L4-5 R  Left lateral flexion Lat knee L4-5 R  Right rotation Limited, R pain  Left rotation Limited, L back & neck pain   (Blank rows = not tested)  LOWER EXTREMITY MMT:    MMT Right eval Left eval  Hip flexion 4 4-  Hip extension 4 4- p!  Hip abduction 4+ 4+  Hip adduction 4- 4-  Hip internal rotation 4- 3+  Hip external rotation 4- p! 3+ p!  Knee flexion 4- 3+  Knee extension 4- 4-  Ankle dorsiflexion 4- 4+  Ankle plantarflexion    Ankle inversion    Ankle eversion     (Blank rows = not tested)  LUMBAR SPECIAL TESTS:  Straight leg raise test: Negative and Slump test: Negative  FUNCTIONAL TESTS:  Timed up and go (TUG): 12.13 sec 30 seconds chair stand test: 9 reps   TREATMENT DATE:  11/13/23 THERAPEUTIC EXERCISE: To improve strength, endurance, ROM, and flexibility.  Demonstration, verbal and tactile cues throughout for technique. Supine HS stretch w/ strap x 30 B Supine KTOS x 30 B Seated HS stretch x 30 B Standing gastroc stretch on wall  PATIENT EDUCATION:  Education details: PT eval findings, anticipated POC, and initial HEP Person educated: Patient Education method: Explanation, Demonstration, Tactile cues, Verbal cues, and Handouts Education comprehension: verbalized understanding, returned demonstration, verbal cues required, tactile cues required, and needs further education  HOME EXERCISE PROGRAM: Access Code: [email or web address] URL:  [email or web address] Date: 11/13/2023 Prepared by: Taylan Mayhan Joseph-Greene  Exercises - Supine Hamstring Stretch with Strap  - 1 x daily - 7 x weekly - 2 sets - 30 hold - Seated Hamstring Stretch  - 1 x daily - 7 x weekly - 2 sets - 30 sec hold - Gastroc Stretch on Wall  - 1 x daily - 7 x weekly - 2 sets - 5 reps - 30 sec hold - Supine Piriformis Stretch with Leg Straight  - 1 x daily - 7 x weekly - 2 sets - 5 reps - 30 sec hold  ASSESSMENT:  CLINICAL IMPRESSION: Valerie Vargas is a 78 y.o. female who was seen today for physical therapy evaluation and treatment for LBP with radiculopathy. Valerie Vargas reports that she fell a few years ago and sustained an injury to her low back, particularly in the L4-5 region. A month or so ago, she stated that she began having radiating pain down both her legs into her ankle. She also mentioned issues with balance. Although she didn't fall within the past 6 months, she's had previous accounts of falling due to feeling unstable. She went to the chiropractor this morning, so she wasn't experiencing any pain, but when she does, her pain is a 10/10. During examination, she showed limitations in lumbar and LE flexibility and deficits in LE strength; showed more deficits on L LE than R LE side. On Modified Oswestry, she scored 26/50 demonstrating 52% or severe disability. Performed TUG in 12.09 sec and 9 reps in 30 sec STS but she showed more sway in the STS. Stated she sometimes feel more unbalanced when getting up to stand. The LBP interferes with her ADLs and impacts her quality of life. Valerie Vargas will benefits from skilled PT to address above deficits to improve mobility and activity tolerance with decreased pain risk of falls.   OBJECTIVE IMPAIRMENTS: decreased activity tolerance, decreased balance, decreased coordination, decreased endurance, decreased knowledge of condition, decreased mobility, decreased ROM, decreased strength, increased fascial restrictions, impaired  perceived functional ability, increased muscle spasms, impaired flexibility, impaired sensation, impaired tone, improper body mechanics, postural dysfunction, and pain.   ACTIVITY LIMITATIONS: lifting, sitting, standing, sleeping, dressing, hygiene/grooming, and locomotion level  PARTICIPATION LIMITATIONS: meal prep, cleaning, laundry, interpersonal relationship, and community activity  PERSONAL FACTORS: Past/current experiences, Time since onset of injury/illness/exacerbation, and 3+ comorbidities: H/O syncope, HTN, DM-II, Nonocclusive coronary atherosclerosis of native coronary artery, Obstructive sleep apnea (adult), Paroxysmal ventricular tachycardia (HCC) are also affecting patient's functional outcome.   REHAB POTENTIAL: Good  CLINICAL DECISION MAKING: Evolving/moderate complexity  EVALUATION COMPLEXITY: Moderate   GOALS: Goals reviewed with patient? Yes  SHORT TERM GOALS: Target date: 12/11/2023  1.  Pt will be independent with initial HEP to improve outcomes and carryover Baseline:  Goal status: INITIAL  2.  Pt will report 25% improvement in low back pain to improve QOL Baseline: 0/10 currently, 10/10 w/ movement Goal status: INITIAL     LONG TERM  GOALS: Target date: 01/22/2024   1.  Pt will be independent with ongoing/advanced HEP for self-management at home Baseline:  Goal status: INITIAL    2.  Pt will report 50-75% improvement in low back pain to improve QOL  Baseline: 0/10 currently, 10/10 w/ movement  Goal status: INITIAL    3.  Pt will report centralization of radicular symptoms  Baseline:  Goal status: INITIAL   4.  Pt will demonstrate at least 25/30 (MCID = 4pts) on FGA to decrease risk of fall  Baseline: TBA Goal status: INITIAL  5.  Pt will improve Berg score to >/= 54/56 to improve safety and stability with ADLs in standing and reduce risk for falls  Baseline: TBA Goal status: INITIAL  6.  Pt will report </= 14/50 on Modified Oswestry (MCID = 12%)  to demonstrate improved functional ability with decreased pain interference Baseline: 26/50 = 52% severe disability Goal status: INITIAL   PLAN:  PT FREQUENCY: 2x/week  PT DURATION: 10 weeks  PLANNED INTERVENTIONS: 97164- PT Re-evaluation, 97750- Physical Performance Testing, 97110-Therapeutic exercises, 97530- Therapeutic activity, [id number]- Neuromuscular re-education, 97535- Self Care, [id number]- Manual therapy, [id number]- Aquatic Therapy, H9716- Electrical stimulation (unattended), 97016- Vasopneumatic device, [id number]- Ultrasound, [id number]- Traction (mechanical), [id number]- Ionotophoresis 4mg /ml Dexamethasone, Patient/Family education, Balance training, Taping, Dry Needling, Joint mobilization, Spinal mobilization, Cryotherapy, and Moist heat.  PLAN FOR NEXT SESSION: Berg Balance Assessment & FGA; Review initial HEP, progress lumbopelvic flexibility/ROM; introduce LE/Core strengthening exercises   Jahmil Macleod Joseph-Greene, Student-PT 11/13/2023, 3:11 PM    PHYSICAL THERAPY DISCHARGE SUMMARY  Visits from Start of Care: 1  Current functional level related to goals / functional outcomes: Refer to above clinical impression and goal assessment for status as of eval visit on 11/13/2023. Patient cancelled all subsequent treatment visits due to in bed with vertigo and did not return to PT within >30 days, therefore will proceed with discharge from PT for this episode.     Remaining deficits: As above. Unable to formally assess status at discharge due to failure to return to PT.    Education / Equipment: Initial HEP   Patient agrees to discharge. Patient goals were not met. Patient is being discharged due to not returning since the last visit.   Elijah EMERSON Hidden, PT 02/21/2024, 11:29 AM  [facility] [address]  [address] [place], [place], [address] Phone: [phone]   Fax:  [phone]

## 2023-11-10 NOTE — Telephone Encounter (Signed)
 CPAP of 12 cm seem to work very well for her. Change to CPAP 12 cm

## 2023-11-10 NOTE — Procedures (Signed)
 Titration Interpretation  Patient Name:  Valerie Vargas, Valerie Vargas Date:  11/01/2023 Referring Physician:  Dr. Celene Coins  Indications for Polysomnography The patient is a 78 year old Female who is 5\' 2"  and weighs 143.0 lbs. Her BMI equals 26.0.  A full night titration treatment study was performed. HST/SNAP 07/2023 AHI 44/hour, low desat 77%   06/2018 CPAP titration>> 13 to 14 cm fullface mask  Polysomnogram Data A full night polysomnogram recorded the standard physiologic parameters including EEG, EOG, EMG, EKG, nasal and oral airflow.  Respiratory parameters of chest and abdominal movements were recorded with Respiratory Inductance Plethysmography belts.  Oxygen  saturation was recorded by pulse oximetry.   Sleep Architecture The total recording time of the polysomnogram was 407.2 minutes.  The total sleep time was 325.5 minutes.  The patient spent 7.2% of total sleep time in Stage N1, 69.1% in Stage N2, 0.3% in Stages N3, and 23.3% in REM.  Sleep latency was 2.5 minutes.  REM latency was 90.0 minutes.  Sleep Efficiency was 79.9%.  Wake after Sleep Onset time was 79.0 minutes.  Titration Summary The patient was titrated at pressures ranging from 6* cm/H20 up to 12* cm/H20 . The last pressure used in the study was 12* cm/H20 .  Respiratory Events The polysomnogram revealed a presence of - obstructive, 3 centrals, and - mixed apneas resulting in an Apnea index of 0.6 events per hour.  There were 12 hypopneas (>=3% desaturation and/or arousal) resulting in an Apnea\Hypopnea Index (AHI >=3% desaturation and/or arousal) of 2.8 events per hour.  There were 2 hypopneas (>=4% desaturation) resulting in an Apnea\Hypopnea Index (AHI >=4% desaturation) of 0.9 events per hour.  There were 13 Respiratory Effort Related Arousals resulting in a RERA index of 2.4 events per hour. The Respiratory Disturbance Index is 5.2 events per hour.  The snore index was 61.8 events per hour.  Mean oxygen  saturation was  95.8%.  The lowest oxygen  saturation during sleep was 91.0%.  Time spent <=88% oxygen  saturation was - minutes (-).  Limb Activity There were - limb movements recorded.  Of this total, - were classified as PLMs.  Of the PLMs, - were associated with arousals.  The Limb Movement index was - per hour while the PLM index was - per hour.  Cardiac Summary The average pulse rate was 66.7 bpm.  The minimum pulse rate was 54.0 bpm while the maximum pulse rate was 80.0 bpm.  Cardiac rhythm was normal/abnormal.  Diagnosis: Severe OSA corrected by CPAP 12 cm  Recommendations: CPAP 12 cm with airfit F 20 full face mask   This study was personally reviewed and electronically signed by: Dr. Celene Coins Accredited Board Certified in Sleep Medicine

## 2023-11-13 ENCOUNTER — Other Ambulatory Visit: Payer: Self-pay

## 2023-11-13 ENCOUNTER — Ambulatory Visit: Attending: Family Medicine | Admitting: Physical Therapy

## 2023-11-13 ENCOUNTER — Encounter: Payer: Self-pay | Admitting: Physical Therapy

## 2023-11-13 DIAGNOSIS — M62838 Other muscle spasm: Secondary | ICD-10-CM | POA: Diagnosis not present

## 2023-11-13 DIAGNOSIS — R2681 Unsteadiness on feet: Secondary | ICD-10-CM | POA: Diagnosis not present

## 2023-11-13 DIAGNOSIS — M5416 Radiculopathy, lumbar region: Secondary | ICD-10-CM | POA: Insufficient documentation

## 2023-11-13 DIAGNOSIS — M79605 Pain in left leg: Secondary | ICD-10-CM | POA: Diagnosis not present

## 2023-11-13 DIAGNOSIS — M545 Low back pain, unspecified: Secondary | ICD-10-CM | POA: Insufficient documentation

## 2023-11-13 DIAGNOSIS — M79604 Pain in right leg: Secondary | ICD-10-CM | POA: Insufficient documentation

## 2023-11-13 DIAGNOSIS — R293 Abnormal posture: Secondary | ICD-10-CM | POA: Diagnosis not present

## 2023-11-13 DIAGNOSIS — M6281 Muscle weakness (generalized): Secondary | ICD-10-CM | POA: Diagnosis not present

## 2023-11-13 NOTE — Telephone Encounter (Signed)
 Pt notified and order placed

## 2023-11-13 NOTE — Assessment & Plan Note (Signed)
 Chronic, associated with hypertension Stable.   Diet controlled.    No longer on metformin BID. Followed by Dr. Ronelle Coffee in past.

## 2023-11-13 NOTE — Assessment & Plan Note (Addendum)
 New finding, she is not on SGLT2 inhibitor or ACE/ARB. Will start with initiation of Jardiance  10 mg p.o. daily. Reviewed significant cardiovascular and renal benefits of Jardiance .  Reviewed possible side effect profile and need for her to be aware of possible urinary tract infection general infection risk.

## 2023-11-17 ENCOUNTER — Other Ambulatory Visit: Payer: Self-pay | Admitting: Family Medicine

## 2023-11-17 MED ORDER — FLUCONAZOLE 150 MG PO TABS: ORAL_TABLET | ORAL | 0 refills | Status: DC

## 2023-11-22 ENCOUNTER — Ambulatory Visit

## 2023-11-23 ENCOUNTER — Encounter (HOSPITAL_BASED_OUTPATIENT_CLINIC_OR_DEPARTMENT_OTHER): Admitting: Pulmonary Disease

## 2023-11-24 ENCOUNTER — Telehealth (HOSPITAL_BASED_OUTPATIENT_CLINIC_OR_DEPARTMENT_OTHER): Payer: Self-pay

## 2023-11-24 ENCOUNTER — Encounter: Admitting: Physical Therapy

## 2023-11-24 NOTE — Telephone Encounter (Signed)
 I have spoke with Valerie Vargas about her Cpap order. She is aware the order was sent to [facility] and that she use to use [facility] back in the day. I informed her I would send urgent message to [facility]

## 2023-11-24 NOTE — Telephone Encounter (Signed)
 I have received a message from [place] with [facility], Dariel Edelson   This order is already in process for new pap unit and is awaiting pap Authorization from the insurance currently. Once Siegfried Dress is received we will be calling to schedule setup.  I have spoke with Mrs. Langston Pippins and she has spoke with Rodman Clam with [facility] and they will be shipping to the patient because there are no appts available to schedule picking up her machine

## 2023-11-24 NOTE — Telephone Encounter (Signed)
 Can we check on status of changes an order was placed end of April   Copied from CRM #[id number]. Topic: Clinical - Medical Advice >> Nov 24, 2023 10:26 AM Ilene Malling wrote: Reason for CRM: Patient [phone] had a sleep study and needs cpap machine settings changes, has not heard from the office. Patient has an upcoming appointment with Dr. Alva for 12/04/23 and wants to get everything in order before leaving for vacation 12/06/23. Please advise and call back.

## 2023-11-27 ENCOUNTER — Encounter

## 2023-11-28 DIAGNOSIS — K080 Exfoliation of teeth due to systemic causes: Secondary | ICD-10-CM | POA: Diagnosis not present

## 2023-11-30 ENCOUNTER — Encounter: Admitting: Physical Therapy

## 2023-11-30 ENCOUNTER — Encounter: Payer: Self-pay | Admitting: Family Medicine

## 2023-11-30 MED ORDER — EMPAGLIFLOZIN 10 MG PO TABS
10.0000 mg | ORAL_TABLET | Freq: Every day | ORAL | 3 refills | Status: AC
Start: 2023-11-30 — End: ?

## 2023-12-03 DIAGNOSIS — R070 Pain in throat: Secondary | ICD-10-CM | POA: Diagnosis not present

## 2023-12-03 DIAGNOSIS — R059 Cough, unspecified: Secondary | ICD-10-CM | POA: Diagnosis not present

## 2023-12-03 DIAGNOSIS — R0981 Nasal congestion: Secondary | ICD-10-CM | POA: Diagnosis not present

## 2023-12-04 ENCOUNTER — Ambulatory Visit (HOSPITAL_BASED_OUTPATIENT_CLINIC_OR_DEPARTMENT_OTHER): Admitting: Pulmonary Disease

## 2023-12-04 ENCOUNTER — Encounter (HOSPITAL_BASED_OUTPATIENT_CLINIC_OR_DEPARTMENT_OTHER): Payer: Self-pay | Admitting: Pulmonary Disease

## 2023-12-04 ENCOUNTER — Encounter: Admitting: Physical Therapy

## 2023-12-04 VITALS — BP 141/73 | HR 81 | Ht 62.5 in | Wt 138.7 lb

## 2023-12-04 DIAGNOSIS — J209 Acute bronchitis, unspecified: Secondary | ICD-10-CM

## 2023-12-04 DIAGNOSIS — G4733 Obstructive sleep apnea (adult) (pediatric): Secondary | ICD-10-CM | POA: Diagnosis not present

## 2023-12-04 NOTE — Progress Notes (Signed)
   Subjective:    Patient ID: Valerie Vargas, female    DOB: 1946-07-16, 78 y.o.   MRN: [id number]  HPI 78 year old woman with known OSA on CPAP therapy She was referred by Dr. Tellis Feathers , she was scheduled for sleep endoscopy procedure in preparation for hypoglossal nerve stimulator device.  She was on CPAP therapy for several years without adequate follow-up.  She was using her husband's old CPAP machine @14  cm   PMH : Vocal cord dysfunction, she has been evaluated by Danney Dutton at [facility] and diagnosed with spasmodic dysphonia.  She is maintained on Xanax  and Tessalon  Perles    She previously used her husband's CPAP machine set at fourteen centimeters. A recent sleep study showed good control at twelve centimeters. She has a new CPAP machine @ 12 cm after titration study now but has not started using it. For the past two months, she has not used her CPAP regularly. Last night, she did not use it due to persistent coughing. She often wakes up without the CPAP, unsure of how long it stays on.  She has bronchitis and was treated with doxycycline and cough syrup yesterday. There is some improvement, but coughing persists.   Significant tests/ events reviewed  10/2023 CPAP titration >> 12 cm   HST/SNAP 07/2023 AHI 44/hour, low desat 77%   06/2018 CPAP titration>> 13 to 14 cm fullface mask   04/2018 NPSG>> AHI 6/hour, supine AHI 10/hour, low sat 78% -weight 132 pounds   01/2004 N PSG mild OSA, AHI 9/hour, low sat 87%     01/2011 PFTs normal    Review of Systems neg for any significant sore throat, dysphagia, itching, sneezing, nasal congestion or excess/ purulent secretions, fever, chills, sweats, unintended wt loss, pleuritic or exertional cp, hempoptysis, orthopnea pnd or change in chronic leg swelling. Also denies presyncope, palpitations, heartburn, abdominal pain, nausea, vomiting, diarrhea or change in bowel or urinary habits, dysuria,hematuria, rash, arthralgias, visual complaints,  headache, numbness weakness or ataxia.     Objective:   Physical Exam  Gen. Pleasant, well-nourished, in no distress ENT - no thrush, no pallor/icterus,no post nasal drip Neck: No JVD, no thyromegaly, no carotid bruits Lungs: no use of accessory muscles, no dullness to percussion, clear without rales or rhonchi  Cardiovascular: Rhythm regular, heart sounds  normal, no murmurs or gallops, no peripheral edema Musculoskeletal: No deformities, no cyanosis or clubbing        Assessment & Plan:  Obstructive Sleep Apnea (OSA) OSA with previous use of husband's CPAP machine set at 14 cm H2O. Recent sleep study showed good control of events at 12 cm H2O. New CPAP machine received but not yet used. Concerns about potential surgical interventions due to anatomical throat deformities. Prefers non-invasive options and is concerned about the irreversibility of surgical interventions. Discussion about Inspire device deferred until CPAP efficacy is evaluated. - Start using the new CPAP machine set at 12 cm H2O. - Collect at least one month of CPAP usage data before next evaluation. - Reassess OSA management after return from travel to [place]  (long road trip) and review of CPAP data.  Acute Bronchitis Acute bronchitis with recent onset. Reports improvement since starting treatment with tigecycline and cough syrup. Persistent cough present. - Continue treatment with doxycycline and cough syrup. - Advise rest and monitor symptoms.

## 2023-12-27 DIAGNOSIS — G4733 Obstructive sleep apnea (adult) (pediatric): Secondary | ICD-10-CM | POA: Diagnosis not present

## 2024-01-26 DIAGNOSIS — G4733 Obstructive sleep apnea (adult) (pediatric): Secondary | ICD-10-CM | POA: Diagnosis not present

## 2024-01-29 ENCOUNTER — Encounter

## 2024-02-01 ENCOUNTER — Other Ambulatory Visit: Payer: Self-pay | Admitting: Family Medicine

## 2024-02-01 NOTE — Telephone Encounter (Signed)
 Last office visit 10/20/23 for DM.  Last refilled 04/14/2023 for #60 with 1 refill.  Next Appt: CPE 04/16/2024.

## 2024-02-16 ENCOUNTER — Telehealth: Payer: Self-pay | Admitting: *Deleted

## 2024-02-16 ENCOUNTER — Ambulatory Visit

## 2024-02-16 VITALS — Ht 62.5 in | Wt 138.0 lb

## 2024-02-16 DIAGNOSIS — Z0000 Encounter for general adult medical examination without abnormal findings: Secondary | ICD-10-CM

## 2024-02-16 NOTE — Telephone Encounter (Signed)
 Valerie Vargas notified as instructed by telephone.   She is not sure if she wants to stop the Jardiance .  She did ask how often can she use the Monistat without it being a problem.  Per Dr. Avelina she can use it up to twice a month.  Patient states understanding and will discuss this further at her CPE in September.

## 2024-02-16 NOTE — Telephone Encounter (Signed)
-----   Message from Greig Ring sent at 02/16/2024 12:04 PM EDT ----- If she is having significant yeast infections and GI upset with Jardiance  she can stop.  She is using this medication for kidney protection given microalbuminuria.  We can discuss further at her next office visit. I see nothing concerning about the supplement,, but there are several ingredients that I have no experience with. ----- Message ----- From: Estella Erminio CROME, LPN Sent: 07/25/7972  10:05 AM EDT To: Greig FORBES Ring, MD  Hi! AWV done for patient this am. Pt relays problems w/yeast infections due to Jardiance  and digestive issues. She relays she is trying a new product (EMMA) and wants feedback if she should continue taking. Thank you! Erminio

## 2024-02-16 NOTE — Progress Notes (Signed)
 Subjective:   Valerie Vargas is a 78 y.o. who presents for a Medicare Wellness preventive visit.  As a reminder, Annual Wellness Visits don't include a physical exam, and some assessments may be limited, especially if this visit is performed virtually. We may recommend an in-person follow-up visit with your provider if needed.  Visit Complete: Virtual I connected with  Valerie Vargas on 02/16/24 by a audio enabled telemedicine application and verified that I am speaking with the correct person using two identifiers.  Patient Location: Home  Provider Location: Office/Clinic  I discussed the limitations of evaluation and management by telemedicine. The patient expressed understanding and agreed to proceed.  Vital Signs: Because this visit was a virtual/telehealth visit, some criteria may be missing or patient reported. Any vitals not documented were not able to be obtained and vitals that have been documented are patient reported.  VideoDeclined- This patient declined Librarian, academic. Therefore the visit was completed with audio only.  Persons Participating in Visit: Patient.  AWV Questionnaire: Yes: Patient Medicare AWV questionnaire was completed by the patient on 02/16/24; I have confirmed that all information answered by patient is correct and no changes since this date.  Cardiac Risk Factors include: advanced age (>51men, >37 women);diabetes mellitus;dyslipidemia;hypertension     Objective:    Today's Vitals   02/16/24 0940  Weight: 138 lb (62.6 kg)  Height: 5' 2.5 (1.588 m)   Body mass index is 24.84 kg/m.     02/16/2024    9:53 AM 11/13/2023    1:12 PM 11/01/2023    8:52 PM 12/28/2022    1:17 PM 04/04/2022    8:27 AM 03/20/2020   11:29 AM 02/19/2019    2:14 PM  Advanced Directives  Does Patient Have a Medical Advance Directive? Yes Yes Yes Yes Yes Yes Yes  Type of Advance Directive Living will;Healthcare Power of [employer] Power of  [place];Living will Healthcare Power of [place];Living will Healthcare Power of [place];Living will Healthcare Power of [place];Living will Healthcare Power of [place];Living will Healthcare Power of [place];Living will  Does patient want to make changes to medical advance directive?  No - Patient declined No - Patient declined      Copy of Healthcare Power of Attorney in Chart? No - copy requested No - copy requested No - copy requested No - copy requested No - copy requested No - copy requested No - copy requested      Data saved with a previous flowsheet row definition    Current Medications (verified) Outpatient Encounter Medications as of 02/16/2024  Medication Sig   ALPRAZolam  (XANAX ) 0.5 MG tablet Take 1 tablet (0.5 mg total) by mouth daily as needed.   Ascorbic Acid (VITAMIN C) 1000 MG tablet Take 1,000 mg by mouth daily.   benzonatate  (TESSALON ) 100 MG capsule TAKE 2 CAPSULES BY MOUTH 3 TIMES A DAY AS NEEDED FOR COUGH   blood glucose meter kit and supplies KIT Dispense based on patient and insurance preference. Use up to four times daily as directed. (FOR ICD-9 250.00, 250.01).   Cholecalciferol (VITAMIN D3) 1000 UNITS CAPS Take 1 capsule by mouth daily.   Cranberry 200 MG CAPS Take by mouth daily in the afternoon.   diphenhydrAMINE-PE-APAP 12.5-5-325 MG TABS Take 1 tablet by mouth at bedtime as needed.   empagliflozin  (JARDIANCE ) 10 MG TABS tablet Take 1 tablet (10 mg total) by mouth daily before breakfast.   fluconazole  (DIFLUCAN ) 150 MG tablet Take 1 tab po today,  repeat in 2 days if needed   gabapentin (NEURONTIN) 100 MG capsule TAKE 3 CAPSULES BY MOUTH 3 TIMES DAILY   glucose blood (ONE TOUCH ULTRA TEST) test strip -TEST TWICE DAILY   Guaifenesin  1200 MG TB12 Take by mouth as needed.   loratadine (CLARITIN) 10 MG tablet Take 10 mg by mouth daily.   metoprolol  succinate (TOPROL -XL) 50 MG 24 hr tablet Take 2 tablets( 100 mg )by mouth in the morning, and 1 tablet by mouth (50 mg  ) at night.   OMEGA-3 1000 MG CAPS Take 1,000 mg by mouth daily.   ONETOUCH DELICA LANCETS 33G MISC -TEST TWICE DAILY   Pyridoxine HCl (VITAMIN B-6 PO) Take 1 tablet by mouth daily.   rosuvastatin  (CRESTOR ) 10 MG tablet TAKE 1 TABLET BY MOUTH EVERY DAY FOR CHOLESTEROL   SYNTHROID 75 MCG tablet Take 75 mcg by mouth daily before breakfast.   Thiamine HCl (VITAMIN B-1) 250 MG tablet Take 250 mg by mouth daily.   vitamin B-12 (CYANOCOBALAMIN) 1000 MCG tablet Take 1,000 mcg by mouth daily.   doxycycline (VIBRA-TABS) 100 MG tablet Take 100 mg by mouth 2 (two) times daily. (Patient not taking: Reported on 02/16/2024)   No facility-administered encounter medications on file as of 02/16/2024.    Allergies (verified) Elmiron [pentosan polysulfate sodium], Hctz [hydrochlorothiazide], Levothyroxine sodium, Penicillins, and Sulfa antibiotics   History: Past Medical History:  Diagnosis Date   Allergy    Allergy, unspecified not elsewhere classified    Arthritis    Asthma    Chronic interstitial cystitis    Cough    Esophageal reflux    H/O syncope 07/18/2005   No recurrence;Tilt table 09/23/05-negative   Hyperlipidemia 08/22/2014   Hypertension    Hypertriglyceridemia    Irritable bowel syndrome    Nonocclusive coronary atherosclerosis of native coronary artery    Obstructive sleep apnea (adult) (pediatric)    Paroxysmal ventricular tachycardia (HCC)    Seizures (HCC)    Sleep apnea    Type II or unspecified type diabetes mellitus without mention of complication, not stated as uncontrolled    Unspecified asthma(493.90)    Unspecified hypothyroidism    Ventricular tachycardia (HCC)    Past Surgical History:  Procedure Laterality Date   ABDOMINAL HYSTERECTOMY  11/16/1979   for endometriosis   CHOLECYSTECTOMY  07/18/2000   COSMETIC SURGERY     When I fell and had to have surgery to repair the damage   ESOPHAGOGASTRODUODENOSCOPY ENDOSCOPY  07/18/2008   EYE SURGERY Left 01/30/2022   FACIAL  NERVE SURGERY     12/2021   Family History  Problem Relation Age of Onset   Diabetes Mother    Breast cancer Mother    Arthritis Mother    Cancer Mother    Diabetes Father    Stomach cancer Father    Heart disease Father    Cancer Father    Hypertension Father    Leukemia Brother    Cancer Brother    Early death Brother    Breast cancer Maternal Aunt    Breast cancer Paternal Aunt    Cancer Paternal Aunt    Varicose Veins Paternal Aunt    Cancer Maternal Grandfather    Hypertension Paternal Grandmother    Stroke Paternal Grandmother    Cancer Paternal Grandfather    Cancer Maternal Aunt    Cancer Paternal Aunt    Diabetes Paternal Aunt    Varicose Veins Paternal Aunt    Heart disease Maternal Aunt  Hypertension Maternal Aunt    Social History   Socioeconomic History   Marital status: Married    Spouse name: Not on file   Number of children: Not on file   Years of education: Not on file   Highest education level: Associate degree: occupational, Scientist, product/process development, or vocational program  Occupational History   Occupation: retired  Tobacco Use   Smoking status: Never   Smokeless tobacco: Never  Vaping Use   Vaping status: Never Used  Substance and Sexual Activity   Alcohol use: No   Drug use: No   Sexual activity: Yes  Other Topics Concern   Not on file  Social History Narrative   Not on file   Social Drivers of Health   Financial Resource Strain: Low Risk  (02/16/2024)   Overall Financial Resource Strain (CARDIA)    Difficulty of Paying Living Expenses: Not hard at all  Food Insecurity: No Food Insecurity (02/16/2024)   Hunger Vital Sign    Worried About Running Out of Food in the Last Year: Never true    Ran Out of Food in the Last Year: Never true  Transportation Needs: Unmet Transportation Needs (02/16/2024)   PRAPARE - Administrator, Civil Service (Medical): Yes    Lack of Transportation (Non-Medical): No  Physical Activity: Inactive (02/16/2024)    Exercise Vital Sign    Days of Exercise per Week: 0 days    Minutes of Exercise per Session: 0 min  Stress: No Stress Concern Present (02/16/2024)   [employer] of Occupational Health - Occupational Stress Questionnaire    Feeling of Stress: Only a little  Social Connections: Socially Integrated (02/16/2024)   Social Connection and Isolation Panel    Frequency of Communication with Friends and Family: Three times a week    Frequency of Social Gatherings with Friends and Family: Once a week    Attends Religious Services: More than 4 times per year    Active Member of [employer] or Organizations: Yes    Attends Banker Meetings: 1 to 4 times per year    Marital Status: Married    Tobacco Counseling Counseling given: Not Answered    Clinical Intake:  Pre-visit preparation completed: Yes  Pain : No/denies pain     BMI - recorded: 24.84 Nutritional Status: BMI of 19-24  Normal Nutritional Risks: None Diabetes: Yes CBG done?: No Did pt. bring in CBG monitor from home?: No  Lab Results  Component Value Date   HGBA1C 6.4 (A) 10/20/2023   HGBA1C 6.6 (H) 02/27/2023   HGBA1C 6.6 (H) 03/24/2021     How often do you need to have someone help you when you read instructions, pamphlets, or other written materials from your doctor or pharmacy?: 1 - Never  Interpreter Needed?: No  Comments: lives with husband Information entered by :: B.Janeal Abadi,LPN   Activities of Daily Living     02/16/2024    8:28 AM  In your present state of health, do you have any difficulty performing the following activities:  Hearing? 0  Vision? 1  Difficulty concentrating or making decisions? 0  Walking or climbing stairs? 1  Dressing or bathing? 0  Doing errands, shopping? 0  Preparing Food and eating ? N  Using the Toilet? N  In the past six months, have you accidently leaked urine? Y  Do you have problems with loss of bowel control? Y  Managing your Medications? N  Managing your  Finances? N  Housekeeping or managing  your Housekeeping? N    Patient Care Team: Avelina Greig BRAVO, MD as PCP - General (Family Medicine) Croitoru, Jerel, MD as PCP - Cardiology (Cardiology) Burnard Debby LABOR, MD (Inactive) as PCP - Sleep Medicine (Cardiology) Tommas Pears, MD as Attending Physician (Endocrinology) Brien Garnette Pepper, MD as Referring Physician (Otolaryngology) Waylan Cain, MD as Consulting Physician (Ophthalmology) Marylen Cone, DDS, PA as Referring Physician (Dentistry) Myra Rosaline FALCON, [facility] (Inactive) as Pharmacist (Pharmacist) Liza Caldron, MD as Referring Physician (Gastroenterology)  I have updated your Care Teams any recent Medical Services you may have received from other providers in the past year.     Assessment:   This is a routine wellness examination for Valerie Vargas.  Hearing/Vision screen Hearing Screening - Comments:: Pt says her hearing is good with hearing aids Vision Screening - Comments:: Pt says her vision is ok w/glasses: has dry eye Dr Cain Waylan   Goals Addressed             This Visit's Progress    COMPLETED: Patient Stated       Starting 02/09/2018, I will continue to walk 6000-10000 steps daily.      COMPLETED: Patient Stated       Wants to get more limber and get down to 130 pounds     COMPLETED: Patient Stated       03/20/2020, I will continue to walk 5,000 steps a day.     COMPLETED: Patient Stated       04/04/2022, wants to get in better shape     Patient Stated   Not on track    Exercise more     Patient Stated       I want to walk more and get strengthened       Depression Screen     02/16/2024    9:48 AM 10/20/2023    2:28 PM 04/14/2023   10:19 AM 12/28/2022    1:17 PM 04/04/2022    8:29 AM 09/30/2021    4:08 PM 04/01/2021   10:21 AM  PHQ 2/9 Scores  PHQ - 2 Score 0 0 1 0 0 0 0  PHQ- 9 Score   11        Fall Risk     02/16/2024    8:28 AM 10/20/2023    2:27 PM 04/14/2023   10:19 AM 12/28/2022    1:18 PM  04/04/2022    8:28 AM  Fall Risk   Falls in the past year? 0 0 1 1 1   Comment     passed out  Number falls in past yr:  0 0 0 0  Injury with Fall?  0 0 0 1  Comment    back is sore   Risk for fall due to : No Fall Risks No Fall Risks Other (Comment) Impaired balance/gait Impaired balance/gait;Medication side effect  Follow up Education provided;Falls prevention discussed Falls evaluation completed Falls evaluation completed Falls evaluation completed;Falls prevention discussed Falls evaluation completed;Education provided;Falls prevention discussed      Data saved with a previous flowsheet row definition    MEDICARE RISK AT HOME:  Medicare Risk at Home Any stairs in or around the home?: (Patient-Rptd) No Home free of loose throw rugs in walkways, pet beds, electrical cords, etc?: (Patient-Rptd) No Adequate lighting in your home to reduce risk of falls?: (Patient-Rptd) Yes Life alert?: (Patient-Rptd) No Use of a cane, walker or w/c?: (Patient-Rptd) No Grab bars in the bathroom?: (Patient-Rptd) Yes Shower chair or bench in shower?: (  Patient-Rptd) Yes Elevated toilet seat or a handicapped toilet?: (Patient-Rptd) Yes  TIMED UP AND GO:  Was the test performed?  No  Cognitive Function: 6CIT completed    03/20/2020   11:41 AM 02/19/2019    2:22 PM 02/09/2018    9:50 AM 02/08/2017   10:50 AM 01/14/2016    1:33 PM  MMSE - Mini Mental State Exam  Orientation to time 5 5 5 5  5    Orientation to Place 5 5 5 5  5    Registration 3 3 3 3  3    Attention/ Calculation 5 5 0 0  0   Recall 3 3 3 2  3    Language- name 2 objects  0 0 0  0   Language- repeat 1 1 1 1 1   Language- follow 3 step command  0 3 3  3    Language- read & follow direction  0 0 0  0   Write a sentence  0 0 0  0   Copy design  0 0 0  0   Total score  22 20 19  20       Data saved with a previous flowsheet row definition        02/16/2024    9:55 AM 12/28/2022    1:20 PM 04/04/2022    8:31 AM  6CIT Screen  What Year? 0  points 0 points 0 points  What month? 0 points 0 points 0 points  What time? 0 points 0 points 0 points  Count back from 20 0 points 0 points 0 points  Months in reverse 0 points 0 points 0 points  Repeat phrase 0 points 0 points 2 points  Total Score 0 points 0 points 2 points    Immunizations Immunization History  Administered Date(s) Administered   Fluad Quad(high Dose 65+) 05/07/2019, 03/27/2020, 04/01/2021, 04/05/2022   Influenza Split 05/15/2013   Influenza, High Dose Seasonal PF 04/19/2016, 05/09/2017, 04/25/2018, 04/13/2023   Influenza,inj,quad, With Preservative 03/18/2021   Influenza-Unspecified 05/01/2014, 06/03/2015, 04/19/2016   Moderna Covid-19 Fall Seasonal Vaccine 36yrs & older 07/04/2022   PFIZER(Purple Top)SARS-COV-2 Vaccination 07/30/2019, 08/20/2019, 05/08/2020   Pneumococcal Conjugate-13 08/15/2012   Pneumococcal Polysaccharide-23 11/06/2014   Tdap 06/03/2015   Zoster Recombinant(Shingrix) 03/31/2020, 07/01/2020   Zoster, Live 03/06/2013    Screening Tests Health Maintenance  Topic Date Due   COVID-19 Vaccine (5 - 2024-25 season) 03/19/2023   MAMMOGRAM  12/27/2023   INFLUENZA VACCINE  02/16/2024   Diabetic kidney evaluation - eGFR measurement  02/27/2024   Colonoscopy  05/30/2024   FOOT EXAM  04/13/2024   HEMOGLOBIN A1C  04/20/2024   OPHTHALMOLOGY EXAM  08/03/2024   Diabetic kidney evaluation - Urine ACR  10/10/2024   Medicare Annual Wellness (AWV)  02/15/2025   DTaP/Tdap/Td (2 - Td or Tdap) 06/02/2025   DEXA SCAN  12/13/2027   Pneumococcal Vaccine: 50+ Years  Completed   Hepatitis C Screening  Completed   Zoster Vaccines- Shingrix  Completed   Hepatitis B Vaccines  Aged Out   HPV VACCINES  Aged Out   Meningococcal B Vaccine  Aged Out    Health Maintenance  Health Maintenance Due  Topic Date Due   COVID-19 Vaccine (5 - 2024-25 season) 03/19/2023   MAMMOGRAM  12/27/2023   INFLUENZA VACCINE  02/16/2024   Diabetic kidney evaluation - eGFR  measurement  02/27/2024   Colonoscopy  05/30/2024   Health Maintenance Items Addressed: None at this time  Additional Screening:  Vision Screening: Recommended  annual ophthalmology exams for early detection of glaucoma and other disorders of the eye. Would you like a referral to an eye doctor? No    Dental Screening: Recommended annual dental exams for proper oral hygiene  Community Resource Referral / Chronic Care Management: CRR required this visit?  No   CCM required this visit?  No   Plan:    I have personally reviewed and noted the following in the patient's chart:   Medical and social history Use of alcohol, tobacco or illicit drugs  Current medications and supplements including opioid prescriptions. Patient is not currently taking opioid prescriptions. Functional ability and status Nutritional status Physical activity Advanced directives List of other physicians Hospitalizations, surgeries, and ER visits in previous 12 months Vitals Screenings to include cognitive, depression, and falls Referrals and appointments  In addition, I have reviewed and discussed with patient certain preventive protocols, quality metrics, and best practice recommendations. A written personalized care plan for preventive services as well as general preventive health recommendations were provided to patient.   Erminio LITTIE Saris, LPN   07/25/7972   After Visit Summary: (MyChart) Due to this being a telephonic visit, the after visit summary with patients personalized plan was offered to patient via MyChart   Notes: Please refer to Routing Comments.

## 2024-02-16 NOTE — Patient Instructions (Signed)
 Ms. Turek , Thank you for taking time out of your busy schedule to complete your Annual Wellness Visit with me. I enjoyed our conversation and look forward to speaking with you again next year. I, as well as your care team,  appreciate your ongoing commitment to your health goals. Please review the following plan we discussed and let me know if I can assist you in the future. Your Game plan/ To Do List    Referrals: If you haven't heard from the office you've been referred to, please reach out to them at the phone provided.   Follow up Visits: We will see or speak with you next year for your Next Medicare AWV with our clinical staff 02/19/25 @ 9:30am televisit Have you seen your provider in the last 6 months (3 months if uncontrolled diabetes)? Yes  Clinician Recommendations:  Aim for 30 minutes of exercise or brisk walking, 6-8 glasses of water, and 5 servings of fruits and vegetables each day.       This is a list of the screenings recommended for you:  Health Maintenance  Topic Date Due   COVID-19 Vaccine (5 - 2024-25 season) 03/19/2023   Eye exam for diabetics  09/06/2023   Mammogram  12/27/2023   Flu Shot  02/16/2024   Yearly kidney function blood test for diabetes  02/27/2024   Colon Cancer Screening  05/30/2024   Complete foot exam   04/13/2024   Hemoglobin A1C  04/20/2024   Yearly kidney health urinalysis for diabetes  10/10/2024   Medicare Annual Wellness Visit  02/15/2025   DTaP/Tdap/Td vaccine (2 - Td or Tdap) 06/02/2025   DEXA scan (bone density measurement)  12/13/2027   Pneumococcal Vaccine for age over 76  Completed   Hepatitis C Screening  Completed   Zoster (Shingles) Vaccine  Completed   Hepatitis B Vaccine  Aged Out   HPV Vaccine  Aged Out   Meningitis B Vaccine  Aged Out    Advanced directives: (Copy Requested) Please bring a copy of your health care power of attorney and living will to the office to be added to your chart at your convenience. You can mail to  [facility] [address] [place], [place] [address] or email to [email or web address] Advance Care Planning is important because it:  [x]  Makes sure you receive the medical care that is consistent with your values, goals, and preferences  [x]  It provides guidance to your family and loved ones and reduces their decisional burden about whether or not they are making the right decisions based on your wishes.  Follow the link provided in your after visit summary or read over the paperwork we have mailed to you to help you started getting your Advance Directives in place. If you need assistance in completing these, please reach out to us  so that we can help you!

## 2024-02-21 ENCOUNTER — Ambulatory Visit: Payer: Self-pay | Admitting: Family Medicine

## 2024-02-21 ENCOUNTER — Ambulatory Visit
Admission: RE | Admit: 2024-02-21 | Discharge: 2024-02-21 | Disposition: A | Discharge status: Home / Self Care | Source: Ambulatory Visit | Attending: Family Medicine | Admitting: Family Medicine

## 2024-02-21 DIAGNOSIS — R928 Other abnormal and inconclusive findings on diagnostic imaging of breast: Secondary | ICD-10-CM | POA: Diagnosis not present

## 2024-02-21 DIAGNOSIS — R92333 Mammographic heterogeneous density, bilateral breasts: Secondary | ICD-10-CM | POA: Diagnosis not present

## 2024-02-21 DIAGNOSIS — R921 Mammographic calcification found on diagnostic imaging of breast: Secondary | ICD-10-CM

## 2024-02-26 DIAGNOSIS — G4733 Obstructive sleep apnea (adult) (pediatric): Secondary | ICD-10-CM | POA: Diagnosis not present

## 2024-03-05 ENCOUNTER — Encounter (HOSPITAL_BASED_OUTPATIENT_CLINIC_OR_DEPARTMENT_OTHER): Payer: Self-pay | Admitting: Adult Health

## 2024-03-05 ENCOUNTER — Ambulatory Visit (INDEPENDENT_AMBULATORY_CARE_PROVIDER_SITE_OTHER): Admitting: Adult Health

## 2024-03-05 VITALS — BP 135/82 | HR 79 | Ht 62.5 in | Wt 130.0 lb

## 2024-03-05 DIAGNOSIS — G4733 Obstructive sleep apnea (adult) (pediatric): Secondary | ICD-10-CM | POA: Diagnosis not present

## 2024-03-05 NOTE — Progress Notes (Unsigned)
 @Patient  ID: Valerie Vargas, female    DOB: Jul 22, 1945, 78 y.o.   MRN: [id number]  No chief complaint on file.   Referring provider: Avelina Greig BRAVO, MD  HPI: 78 year old woman with known history of obstructive sleep apnea Medical history significant for vocal cord dysfunction-previous evaluation with Dr. Brien at [facility] -treated for spasmodic dysphonia   TEST/EVENTS :  10/2023 CPAP titration >> 12 cm   HST/SNAP 07/2023 AHI 44/hour, low desat 77%   06/2018 CPAP titration>> 13 to 14 cm fullface mask   04/2018 NPSG>> AHI 6/hour, supine AHI 10/hour, low sat 78% -weight 132 pounds   01/2004 N PSG mild OSA, AHI 9/hour, low sat 87%     01/2011 PFTs normal  03/05/2024 Follow up : OSA Patient presents for a 80-month follow-up.  Patient has severe obstructive sleep apnea.  Had previously had difficulties tolerating CPAP.  Was considering going for an evaluation for the inspire device but has decided to continue with noninvasive treatment options.  She has recently gotten a new CPAP machine.  Is wearing CPAP most every night.  She does feel that she is benefiting from CPAP.  CPAP download shows excellent compliance at 79% usage.  Daily average usage at 5 hours.  She is on CPAP 12 cm H2O.  AHI is 3.7/hour.  Mask is leaking.  She is currently using  Allergies  Allergen Reactions   Elmiron [Pentosan Polysulfate Sodium] Anxiety    I felt like I wanted to kill myself   Hctz [Hydrochlorothiazide] Other (See Comments)    Unknown reaction   Levothyroxine Sodium Other (See Comments)    Only to Generic Levothyroxine.  Can only take Brand Name Synthroid   Penicillins Rash   Sulfa Antibiotics Rash    Immunization History  Administered Date(s) Administered   Fluad Quad(high Dose 65+) 05/07/2019, 03/27/2020, 04/01/2021, 04/05/2022   Influenza Split 05/15/2013   Influenza, High Dose Seasonal PF 04/19/2016, 05/09/2017, 04/25/2018, 04/13/2023   Influenza,inj,quad, With Preservative 03/18/2021    Influenza-Unspecified 05/01/2014, 06/03/2015, 04/19/2016   Moderna Covid-19 Fall Seasonal Vaccine 70yrs & older 07/04/2022   PFIZER(Purple Top)SARS-COV-2 Vaccination 07/30/2019, 08/20/2019, 05/08/2020   Pneumococcal Conjugate-13 08/15/2012   Pneumococcal Polysaccharide-23 11/06/2014   Tdap 06/03/2015   Zoster Recombinant(Shingrix) 03/31/2020, 07/01/2020   Zoster, Live 03/06/2013    Past Medical History:  Diagnosis Date   Allergy    Allergy, unspecified not elsewhere classified    Arthritis    Asthma    Chronic interstitial cystitis    Cough    Esophageal reflux    H/O syncope 07/18/2005   No recurrence;Tilt table 09/23/05-negative   Hyperlipidemia 08/22/2014   Hypertension    Hypertriglyceridemia    Irritable bowel syndrome    Nonocclusive coronary atherosclerosis of native coronary artery    Obstructive sleep apnea (adult) (pediatric)    Paroxysmal ventricular tachycardia (HCC)    Seizures (HCC)    Sleep apnea    Type II or unspecified type diabetes mellitus without mention of complication, not stated as uncontrolled    Unspecified asthma(493.90)    Unspecified hypothyroidism    Ventricular tachycardia (HCC)     Tobacco History: Social History   Tobacco Use  Smoking Status Never  Smokeless Tobacco Never   Counseling given: Not Answered   Outpatient Medications Prior to Visit  Medication Sig Dispense Refill   ALPRAZolam  (XANAX ) 0.5 MG tablet Take 1 tablet (0.5 mg total) by mouth daily as needed. 5 tablet 0   Ascorbic Acid (VITAMIN C) 1000 MG tablet  Take 1,000 mg by mouth daily.     benzonatate  (TESSALON ) 100 MG capsule TAKE 2 CAPSULES BY MOUTH 3 TIMES A DAY AS NEEDED FOR COUGH 60 capsule 1   blood glucose meter kit and supplies KIT Dispense based on patient and insurance preference. Use up to four times daily as directed. (FOR ICD-9 250.00, 250.01). 1 each 0   Cholecalciferol (VITAMIN D3) 1000 UNITS CAPS Take 1 capsule by mouth daily.     Cranberry 200 MG CAPS Take  by mouth daily in the afternoon.     diphenhydrAMINE-PE-APAP 12.5-5-325 MG TABS Take 1 tablet by mouth at bedtime as needed.     doxycycline (VIBRA-TABS) 100 MG tablet Take 100 mg by mouth 2 (two) times daily. (Patient not taking: Reported on 02/16/2024)     empagliflozin  (JARDIANCE ) 10 MG TABS tablet Take 1 tablet (10 mg total) by mouth daily before breakfast. 90 tablet 3   fluconazole  (DIFLUCAN ) 150 MG tablet Take 1 tab po today, repeat in 2 days if needed 2 tablet 0   gabapentin (NEURONTIN) 100 MG capsule TAKE 3 CAPSULES BY MOUTH 3 TIMES DAILY 270 capsule 0   glucose blood (ONE TOUCH ULTRA TEST) test strip -TEST TWICE DAILY 100 each 6   Guaifenesin  1200 MG TB12 Take by mouth as needed.     loratadine (CLARITIN) 10 MG tablet Take 10 mg by mouth daily.     metoprolol  succinate (TOPROL -XL) 50 MG 24 hr tablet Take 2 tablets( 100 mg )by mouth in the morning, and 1 tablet by mouth (50 mg ) at night.     OMEGA-3 1000 MG CAPS Take 1,000 mg by mouth daily.     ONETOUCH DELICA LANCETS 33G MISC -TEST TWICE DAILY 100 each 5   Pyridoxine HCl (VITAMIN B-6 PO) Take 1 tablet by mouth daily.     rosuvastatin  (CRESTOR ) 10 MG tablet TAKE 1 TABLET BY MOUTH EVERY DAY FOR CHOLESTEROL 90 tablet 3   SYNTHROID 75 MCG tablet Take 75 mcg by mouth daily before breakfast.     Thiamine HCl (VITAMIN B-1) 250 MG tablet Take 250 mg by mouth daily.     vitamin B-12 (CYANOCOBALAMIN) 1000 MCG tablet Take 1,000 mcg by mouth daily.     No facility-administered medications prior to visit.     Review of Systems:   Constitutional:   No  weight loss, night sweats,  Fevers, chills, fatigue, or  lassitude.  HEENT:   No headaches,  Difficulty swallowing,  Tooth/dental problems, or  Sore throat,                No sneezing, itching, ear ache, nasal congestion, post nasal drip,   CV:  No chest pain,  Orthopnea, PND, swelling in lower extremities, anasarca, dizziness, palpitations, syncope.   GI  No heartburn, indigestion, abdominal  pain, nausea, vomiting, diarrhea, change in bowel habits, loss of appetite, bloody stools.   Resp: No shortness of breath with exertion or at rest.  No excess mucus, no productive cough,  No non-productive cough,  No coughing up of blood.  No change in color of mucus.  No wheezing.  No chest wall deformity  Skin: no rash or lesions.  GU: no dysuria, change in color of urine, no urgency or frequency.  No flank pain, no hematuria   MS:  No joint pain or swelling.  No decreased range of motion.  No back pain.    Physical Exam  There were no vitals taken for this visit.  GEN:  A/Ox3; pleasant , NAD, well nourished    HEENT:  Valerie Vargas,  EACs-clear, TMs-wnl, NOSE-clear, THROAT-clear, no lesions, no postnasal drip or exudate noted.   NECK:  Supple w/ fair ROM; no JVD; normal carotid impulses w/o bruits; no thyromegaly or nodules palpated; no lymphadenopathy.    RESP  Clear  P & A; w/o, wheezes/ rales/ or rhonchi. no accessory muscle use, no dullness to percussion  CARD:  RRR, no m/r/g, no peripheral edema, pulses intact, no cyanosis or clubbing.  GI:   Soft & nt; nml bowel sounds; no organomegaly or masses detected.   Musco: Warm bil, no deformities or joint swelling noted.   Neuro: alert, no focal deficits noted.    Skin: Warm, no lesions or rashes    Lab Results:  CBC    Component Value Date/Time   WBC 8.1 02/27/2023 0943   WBC 6.4 04/08/2022 0901   RBC 4.51 02/27/2023 0943   RBC 4.23 04/08/2022 0901   HGB 13.5 02/27/2023 0943   HCT 40.2 02/27/2023 0943   PLT 248 02/27/2023 0943   MCV 89 02/27/2023 0943   MCH 29.9 02/27/2023 0943   MCHC 33.6 02/27/2023 0943   MCHC 33.0 04/08/2022 0901   RDW 12.1 02/27/2023 0943   LYMPHSABS 1.8 04/08/2022 0901   MONOABS 0.5 04/08/2022 0901   EOSABS 0.1 04/08/2022 0901   BASOSABS 0.1 04/08/2022 0901    BMET    Component Value Date/Time   NA 139 02/27/2023 0943   K 4.8 02/27/2023 0943   CL 103 02/27/2023 0943   CO2 20 02/27/2023  0943   GLUCOSE 148 (H) 02/27/2023 0943   GLUCOSE 102 (H) 04/08/2022 0901   BUN 16 02/27/2023 0943   CREATININE 0.80 02/27/2023 0943   CALCIUM  9.7 02/27/2023 0943   GFRNONAA 61 03/19/2020 0000   GFRAA 70 03/19/2020 0000    BNP No results found for: BNP  ProBNP No results found for: PROBNP  Imaging: MM 3D DIAGNOSTIC MAMMOGRAM BILATERAL BREAST Result Date: 02/21/2024 CLINICAL DATA:  One year follow-up of LEFT breast calcifications. Annual RIGHT mammogram. Patient reports symmetric lightening of color of the nipples. Her family history is positive for breast cancer in mother at age 95, maternal and paternal aunts. EXAM: DIGITAL DIAGNOSTIC BILATERAL MAMMOGRAM WITH TOMOSYNTHESIS AND CAD TECHNIQUE: Bilateral digital diagnostic mammography and breast tomosynthesis was performed. The images were evaluated with computer-aided detection. COMPARISON:  Previous exam(s). ACR Breast Density Category c: The breasts are heterogeneously dense, which may obscure small masses. FINDINGS: RIGHT: Mammogram: No suspicious mass, distortion, or microcalcifications are identified to suggest presence of malignancy. LEFT: Mammogram: Group of scattered coarse calcifications in the upper LEFT breast at middle to posterior depth are unchanged since 12/27/2022. No new suspicious mass, distortion, or microcalcifications are identified to suggest presence of malignancy. Physical examination: On focused examination, the nipple areolar complexes appear symmetric bilaterally. No significant skin thickening is identified. IMPRESSION: 1. Probably benign LEFT upper breast calcifications have been stable for 1 year. 2. No evidence of RIGHT breast malignancy. RECOMMENDATION: 1. BILATERAL diagnostic mammogram with magnification views of the LEFT breast in 1 year. 2. Any further workup of the patient's symptoms of symmetric nipple color change should be based on the clinical assessment. 3. Patient has heterogeneously breast density.  Supplemental screening with breast ultrasound (where available) or breast MRI with and without contrast/abbreviated breast MRI could be considered as clinically warranted. I have discussed the findings and recommendations with the patient. If applicable, a reminder letter will be sent to  the patient regarding the next appointment. BI-RADS CATEGORY  3: Probably benign. Electronically Signed   By: Aliene Lloyd M.D.   On: 02/21/2024 10:54    Administration History     None           No data to display          No results found for: NITRICOXIDE      Assessment & Plan:   No problem-specific Assessment & Plan notes found for this encounter.     Madelin Stank, NP 03/05/2024

## 2024-03-05 NOTE — Patient Instructions (Signed)
 Continue on CPAP At bedtime, all night long for at least 6hr or more Would stop using Benadryl.  Activity as tolerated  Follow up with Dr. Jude  in 6 months and As needed

## 2024-03-07 NOTE — Assessment & Plan Note (Signed)
 Excellent control and compliance on CPAP  Plan  Patient Instructions  Continue on CPAP At bedtime, all night long for at least 6hr or more Would stop using Benadryl.  Activity as tolerated  Follow up with Dr. Jude  in 6 months and As needed

## 2024-04-02 ENCOUNTER — Telehealth: Payer: Self-pay | Admitting: *Deleted

## 2024-04-02 DIAGNOSIS — E1159 Type 2 diabetes mellitus with other circulatory complications: Secondary | ICD-10-CM

## 2024-04-02 DIAGNOSIS — E038 Other specified hypothyroidism: Secondary | ICD-10-CM

## 2024-04-02 DIAGNOSIS — E1169 Type 2 diabetes mellitus with other specified complication: Secondary | ICD-10-CM

## 2024-04-02 DIAGNOSIS — R5383 Other fatigue: Secondary | ICD-10-CM

## 2024-04-02 NOTE — Telephone Encounter (Signed)
-----   Message from Harlene Du sent at 03/28/2024  1:56 PM EDT ----- Regarding: Lab Thurs. 04/11/24 Hello,  Patient is coming in for CPE labs on Thursday 04/11/24. Can we get orders please.   Thanks

## 2024-04-04 ENCOUNTER — Telehealth: Payer: Self-pay | Admitting: *Deleted

## 2024-04-04 NOTE — Telephone Encounter (Signed)
 Copied from CRM [id number]. Topic: Clinical - Medication Question >> Apr 04, 2024 12:05 PM Charlet HERO wrote: Reason for CRM: Patient is calling about a lab that she wanted to know if a lab that her insurance is asking her to do can be done in the office. Please call at [phone] her cell number

## 2024-04-04 NOTE — Telephone Encounter (Signed)
 Yes, I agree this is likely microalbumin test.  We can do that at her office visit.

## 2024-04-04 NOTE — Telephone Encounter (Signed)
 Spoke with Mrs. Valerie Vargas.  She states her insurance sent her a collection kit she thinks is due to the Jardiance  and the loss of protein in her urine.  She states she is not comfortable doing this through her insurance and wants to know if we can do the test when she comes in for her CPE labs.  I am guessing this is probably for a microalbumin which I will order but will also forward to Dr. Avelina for her input.  Patient will be kit with her to her appointment with Dr. Avelina for us  to see.

## 2024-04-04 NOTE — Addendum Note (Signed)
 Addended by: WENDELL ARLAND RAMAN on: 04/04/2024 12:37 PM   Modules accepted: Orders

## 2024-04-09 ENCOUNTER — Other Ambulatory Visit

## 2024-04-10 ENCOUNTER — Encounter: Payer: Self-pay | Admitting: Family Medicine

## 2024-04-11 ENCOUNTER — Other Ambulatory Visit

## 2024-04-11 ENCOUNTER — Ambulatory Visit: Payer: Self-pay | Admitting: Family Medicine

## 2024-04-11 DIAGNOSIS — E038 Other specified hypothyroidism: Secondary | ICD-10-CM

## 2024-04-11 DIAGNOSIS — E785 Hyperlipidemia, unspecified: Secondary | ICD-10-CM | POA: Diagnosis not present

## 2024-04-11 DIAGNOSIS — R5383 Other fatigue: Secondary | ICD-10-CM

## 2024-04-11 DIAGNOSIS — E1169 Type 2 diabetes mellitus with other specified complication: Secondary | ICD-10-CM

## 2024-04-11 DIAGNOSIS — E1159 Type 2 diabetes mellitus with other circulatory complications: Secondary | ICD-10-CM

## 2024-04-11 LAB — COMPREHENSIVE METABOLIC PANEL WITH GFR
ALT: 13 U/L (ref 0–35)
AST: 20 U/L (ref 0–37)
Albumin: 4.8 g/dL (ref 3.5–5.2)
Alkaline Phosphatase: 44 U/L (ref 39–117)
BUN: 20 mg/dL (ref 6–23)
CO2: 27 meq/L (ref 19–32)
Calcium: 9.9 mg/dL (ref 8.4–10.5)
Chloride: 103 meq/L (ref 96–112)
Creatinine, Ser: 0.88 mg/dL (ref 0.40–1.20)
GFR: 62.33 mL/min (ref >60.00)
Glucose, Bld: 112 mg/dL — ABNORMAL HIGH (ref 70–99)
Potassium: 4 meq/L (ref 3.5–5.1)
Sodium: 140 meq/L (ref 135–145)
Total Bilirubin: 0.6 mg/dL (ref 0.2–1.2)
Total Protein: 7.5 g/dL (ref 6.0–8.3)

## 2024-04-11 LAB — CBC WITH DIFFERENTIAL/PLATELET
Basophils Absolute: 0 K/uL (ref 0.0–0.1)
Basophils Relative: 0.7 % (ref 0.0–3.0)
Eosinophils Absolute: 0.1 K/uL (ref 0.0–0.7)
Eosinophils Relative: 1.7 % (ref 0.0–5.0)
HCT: 41.4 % (ref 36.0–46.0)
Hemoglobin: 13.6 g/dL (ref 12.0–15.0)
Lymphocytes Relative: 31.3 % (ref 12.0–46.0)
Lymphs Abs: 1.6 K/uL (ref 0.7–4.0)
MCHC: 32.9 g/dL (ref 30.0–36.0)
MCV: 90.7 fl (ref 78.0–100.0)
Monocytes Absolute: 0.4 K/uL (ref 0.1–1.0)
Monocytes Relative: 7.5 % (ref 3.0–12.0)
Neutro Abs: 3 K/uL (ref 1.4–7.7)
Neutrophils Relative %: 58.8 % (ref 43.0–77.0)
Platelets: 200 K/uL (ref 150.0–400.0)
RBC: 4.56 Mil/uL (ref 3.87–5.11)
RDW: 13 % (ref 11.5–15.5)
WBC: 5.1 K/uL (ref 4.0–10.5)

## 2024-04-11 LAB — TSH: TSH: 1.25 u[IU]/mL (ref 0.35–5.50)

## 2024-04-11 LAB — IBC + FERRITIN
Ferritin: 23.7 ng/mL (ref 10.0–291.0)
Iron: 108 ug/dL (ref 42–145)
Saturation Ratios: 24.3 % (ref 20.0–50.0)
TIBC: 445.2 ug/dL (ref 250.0–450.0)
Transferrin: 318 mg/dL (ref 212.0–360.0)

## 2024-04-11 LAB — LIPID PANEL
Cholesterol: 177 mg/dL (ref 0–200)
HDL: 56.4 mg/dL (ref >39.00)
LDL Cholesterol: 77 mg/dL (ref 0–99)
NonHDL: 120.59
Total CHOL/HDL Ratio: 3
Triglycerides: 218 mg/dL — ABNORMAL HIGH (ref 0.0–149.0)
VLDL: 43.6 mg/dL — ABNORMAL HIGH (ref 0.0–40.0)

## 2024-04-11 LAB — VITAMIN D 25 HYDROXY (VIT D DEFICIENCY, FRACTURES): VITD: 30.91 ng/mL (ref 30.00–100.00)

## 2024-04-11 LAB — MICROALBUMIN / CREATININE URINE RATIO
Creatinine,U: 72 mg/dL
Microalb Creat Ratio: 34.9 mg/g — ABNORMAL HIGH (ref 0.0–30.0)
Microalb, Ur: 2.5 mg/dL — ABNORMAL HIGH (ref 0.0–1.9)

## 2024-04-11 LAB — T3, FREE: T3, Free: 2.9 pg/mL (ref 2.3–4.2)

## 2024-04-11 LAB — T4, FREE: Free T4: 0.75 ng/dL (ref 0.60–1.60)

## 2024-04-11 LAB — HEMOGLOBIN A1C: Hgb A1c MFr Bld: 6.9 % — ABNORMAL HIGH (ref 4.6–6.5)

## 2024-04-11 NOTE — Progress Notes (Signed)
 No critical labs need to be addressed urgently. We will discuss labs in detail at upcoming office visit.

## 2024-04-16 ENCOUNTER — Ambulatory Visit: Admitting: Family Medicine

## 2024-04-16 ENCOUNTER — Encounter: Payer: Self-pay | Admitting: Family Medicine

## 2024-04-16 VITALS — BP 156/86 | HR 61 | Temp 97.7°F | Ht 62.25 in | Wt 136.2 lb

## 2024-04-16 DIAGNOSIS — E1169 Type 2 diabetes mellitus with other specified complication: Secondary | ICD-10-CM

## 2024-04-16 DIAGNOSIS — E785 Hyperlipidemia, unspecified: Secondary | ICD-10-CM

## 2024-04-16 DIAGNOSIS — E1159 Type 2 diabetes mellitus with other circulatory complications: Secondary | ICD-10-CM

## 2024-04-16 DIAGNOSIS — Z23 Encounter for immunization: Secondary | ICD-10-CM

## 2024-04-16 DIAGNOSIS — R809 Proteinuria, unspecified: Secondary | ICD-10-CM | POA: Diagnosis not present

## 2024-04-16 DIAGNOSIS — I152 Hypertension secondary to endocrine disorders: Secondary | ICD-10-CM

## 2024-04-16 DIAGNOSIS — E038 Other specified hypothyroidism: Secondary | ICD-10-CM | POA: Diagnosis not present

## 2024-04-16 DIAGNOSIS — K582 Mixed irritable bowel syndrome: Secondary | ICD-10-CM

## 2024-04-16 DIAGNOSIS — Z803 Family history of malignant neoplasm of breast: Secondary | ICD-10-CM

## 2024-04-16 LAB — HM DIABETES FOOT EXAM

## 2024-04-16 MED ORDER — GLUCOSE BLOOD VI STRP: ORAL_STRIP | 3 refills | Status: AC

## 2024-04-16 MED ORDER — LUBIPROSTONE 8 MCG PO CAPS: 8.0000 ug | ORAL_CAPSULE | Freq: Every day | ORAL | 0 refills | Status: DC

## 2024-04-16 MED ORDER — ONETOUCH ULTRASOFT LANCETS MISC: 3 refills | Status: AC

## 2024-04-16 NOTE — Assessment & Plan Note (Signed)
 Jardiance 10 mg daily

## 2024-04-16 NOTE — Addendum Note (Signed)
 Addended by: WENDELL ARLAND RAMAN on: 04/16/2024 12:20 PM   Modules accepted: Orders

## 2024-04-16 NOTE — Assessment & Plan Note (Signed)
Stable, chronic.  Continue current medication.   Crestor 10 mg daily 

## 2024-04-16 NOTE — Assessment & Plan Note (Addendum)
 Poor control in office today, chronic.  Continue current medication.   Losartan  25 mg p.o. daily Metoprolol  100 mg in AM and  50 mg p.o. PM

## 2024-04-16 NOTE — Progress Notes (Signed)
 Patient ID: Valerie Vargas, female    DOB: 06-01-1946, 78 y.o.   MRN: [id number]  This visit was conducted in person.  BP (!) 177/70   Pulse 61   Temp 97.7 F (36.5 C) (Temporal)   Ht 5' 2.25 (1.581 m)   Wt 136 lb 4 oz (61.8 kg)   SpO2 97%   BMI 24.72 kg/m    CC:  Chief Complaint  Patient presents with   Annual Exam    MWV 02/16/2024    Subjective:   HPI: Valerie Vargas is a 78 y.o. female presenting on 04/16/2024 for Annual Exam (MWV 02/16/2024)  The patient presents for  complete physical and review of chronic health problems. He/She also has the following acute concerns today: Long term issues with constipation, bloating and right sided abdominal pain...  Has seen many GI.  Fiber makes it worse.  Sounds like IBS C/D.  Wt Readings from Last 3 Encounters:  04/16/24 136 lb 4 oz (61.8 kg)  03/05/24 130 lb (59 kg)  02/16/24 138 lb (62.6 kg)    The patient saw a LPN or RN for medicare wellness visit. 12/28/22  Prevention and wellness was reviewed in detail. Note reviewed and important notes copied below. .  Hypothyroidism: Chronic, well-controlled on namebrand Synthroid 75 mcg daily followed by Endo. Lab Results  Component Value Date   TSH 1.25 04/11/2024   Diabetes: Well-controlled .SABRA No longer on metfomrin. On jardiance  10 mg daily   Higher given went on trip Lab Results  Component Value Date   HGBA1C 6.9 (H) 04/11/2024  Using medications without difficulties: no SE.... no recent urinary infection, occ yeast Hypoglycemic episodes: none Hyperglycemic episodes: none Feet problems: no ulcers Blood Sugars averaging:  not checking regularly. eye exam within last year: yes  Hypertension:   Poor control in office today on losartan  25 mg daily and metoprolol  100 mg in AM and  50 mg p.o. PM  Did not sleep well, did not use CPAP last night BP Readings from Last 3 Encounters:  04/16/24 (!) 177/70  03/05/24 135/82  12/04/23 (!) 141/73  Using medication without  problems or lightheadedness:  none Chest pain with exertion: none Edema:none Short of breath: none Average home BPs: Other issues: followed by cardiology.SABRASABRAUp-to-date with yearly eye exam.  Elevated Cholesterol: LDL at goal on Crestor  10 mg p.o. daily. Lab Results  Component Value Date   CHOL 177 04/11/2024   HDL 56.40 04/11/2024   LDLCALC 77 04/11/2024   LDLDIRECT 70.0 02/09/2018   TRIG 218.0 (H) 04/11/2024   CHOLHDL 3 04/11/2024  Using medications without problems: Muscle aches:  Diet compliance: issues as below Exercise:  none.. trying to get back to exercise. Other complaints:   ENT prescribing gabapentin, alprazolam  and benzonatate  for spasmodic dysphonia: hoarse voice, throat clearing etc.  Fairly stable overall.. now released by ENT for me to prescribe these meds.    OSA: on CPAP doing better overall with memory. Still feeling tired overall... averaging  wearing it every  4 hours. Restless at night. Joint ache... using tylenol ES  Relevant past medical, surgical, family and social history reviewed and updated as indicated. Interim medical history since our last visit reviewed. Allergies and medications reviewed and updated. Outpatient Medications Prior to Visit  Medication Sig Dispense Refill   ALPRAZolam  (XANAX ) 0.5 MG tablet Take 1 tablet (0.5 mg total) by mouth daily as needed. 5 tablet 0   Ascorbic Acid (VITAMIN C) 1000 MG tablet Take 1,000 mg  by mouth daily.     benzonatate  (TESSALON ) 100 MG capsule TAKE 2 CAPSULES BY MOUTH 3 TIMES A DAY AS NEEDED FOR COUGH 60 capsule 1   blood glucose meter kit and supplies KIT Dispense based on patient and insurance preference. Use up to four times daily as directed. (FOR ICD-9 250.00, 250.01). 1 each 0   Cholecalciferol (VITAMIN D3) 1000 UNITS CAPS Take 1 capsule by mouth daily.     diphenhydrAMINE-PE-APAP 12.5-5-325 MG TABS Take 1 tablet by mouth at bedtime as needed.     empagliflozin  (JARDIANCE ) 10 MG TABS tablet Take 1 tablet  (10 mg total) by mouth daily before breakfast. 90 tablet 3   fluconazole  (DIFLUCAN ) 150 MG tablet Take 1 tab po today, repeat in 2 days if needed 2 tablet 0   gabapentin (NEURONTIN) 100 MG capsule TAKE 3 CAPSULES BY MOUTH 3 TIMES DAILY 270 capsule 0   glucose blood (ONE TOUCH ULTRA TEST) test strip -TEST TWICE DAILY 100 each 6   Guaifenesin  1200 MG TB12 Take by mouth as needed.     loratadine (CLARITIN) 10 MG tablet Take 10 mg by mouth daily.     metoprolol  succinate (TOPROL -XL) 50 MG 24 hr tablet Take 2 tablets( 100 mg )by mouth in the morning, and 1 tablet by mouth (50 mg ) at night.     OMEGA-3 1000 MG CAPS Take 1,000 mg by mouth daily.     ONETOUCH DELICA LANCETS 33G MISC -TEST TWICE DAILY 100 each 5   Pyridoxine HCl (VITAMIN B-6 PO) Take 1 tablet by mouth daily.     rosuvastatin  (CRESTOR ) 10 MG tablet TAKE 1 TABLET BY MOUTH EVERY DAY FOR CHOLESTEROL 90 tablet 3   SYNTHROID 75 MCG tablet Take 75 mcg by mouth daily before breakfast.     Thiamine HCl (VITAMIN B-1) 250 MG tablet Take 250 mg by mouth daily.     vitamin B-12 (CYANOCOBALAMIN) 1000 MCG tablet Take 1,000 mcg by mouth daily.     Cranberry 200 MG CAPS Take by mouth daily in the afternoon.     doxycycline (VIBRA-TABS) 100 MG tablet Take 100 mg by mouth 2 (two) times daily.     No facility-administered medications prior to visit.     Per HPI unless specifically indicated in ROS section below Review of Systems  Constitutional:  Positive for fatigue. Negative for fever.  HENT:  Negative for congestion.   Eyes:  Negative for pain.  Respiratory:  Negative for cough and shortness of breath.   Cardiovascular:  Negative for chest pain, palpitations and leg swelling.  Gastrointestinal:  Positive for abdominal distention, abdominal pain, constipation and diarrhea. Negative for anal bleeding, blood in stool, nausea, rectal pain and vomiting.  Genitourinary:  Negative for dysuria and vaginal bleeding.  Musculoskeletal:  Negative for back  pain.  Neurological:  Negative for syncope, light-headedness and headaches.  Psychiatric/Behavioral:  Negative for dysphoric mood.    Objective:  BP (!) 177/70   Pulse 61   Temp 97.7 F (36.5 C) (Temporal)   Ht 5' 2.25 (1.581 m)   Wt 136 lb 4 oz (61.8 kg)   SpO2 97%   BMI 24.72 kg/m   Wt Readings from Last 3 Encounters:  04/16/24 136 lb 4 oz (61.8 kg)  03/05/24 130 lb (59 kg)  02/16/24 138 lb (62.6 kg)      Physical Exam Constitutional:      General: She is not in acute distress.    Appearance: Normal appearance. She is well-developed.  She is not ill-appearing or toxic-appearing.  HENT:     Head: Normocephalic.     Right Ear: Hearing, tympanic membrane, ear canal and external ear normal. Tympanic membrane is not erythematous, retracted or bulging.     Left Ear: Hearing, tympanic membrane, ear canal and external ear normal. Tympanic membrane is not erythematous, retracted or bulging.     Nose: No mucosal edema or rhinorrhea.     Right Sinus: No maxillary sinus tenderness or frontal sinus tenderness.     Left Sinus: No maxillary sinus tenderness or frontal sinus tenderness.     Mouth/Throat:     Pharynx: Uvula midline.  Eyes:     General: Lids are normal. Lids are everted, no foreign bodies appreciated.     Conjunctiva/sclera: Conjunctivae normal.     Pupils: Pupils are equal, round, and reactive to light.  Neck:     Thyroid : No thyroid  mass or thyromegaly.     Vascular: No carotid bruit.     Trachea: Trachea normal.  Cardiovascular:     Rate and Rhythm: Normal rate and regular rhythm.     Pulses: Normal pulses.     Heart sounds: Normal heart sounds, S1 normal and S2 normal. No murmur heard.    No friction rub. No gallop.  Pulmonary:     Effort: Pulmonary effort is normal. No tachypnea or respiratory distress.     Breath sounds: Normal breath sounds. No decreased breath sounds, wheezing, rhonchi or rales.  Abdominal:     General: Bowel sounds are normal.      Palpations: Abdomen is soft.     Tenderness: There is no abdominal tenderness.  Musculoskeletal:     Cervical back: Normal range of motion and neck supple.  Skin:    General: Skin is warm and dry.     Findings: No rash.  Neurological:     Mental Status: She is alert.  Psychiatric:        Mood and Affect: Mood is not anxious or depressed.        Speech: Speech normal.        Behavior: Behavior normal. Behavior is cooperative.        Thought Content: Thought content normal.        Judgment: Judgment normal.     Diabetic foot exam: Normal inspection No skin breakdown No calluses  Normal DP pulses Normal sensation to light touch and monofilament Nails normal     Results for orders placed or performed in visit on 04/16/24  HM DIABETES FOOT EXAM   Collection Time: 04/16/24 12:00 AM  Result Value Ref Range   HM Diabetic Foot Exam done      COVID 19 screen:  No recent travel or known exposure to COVID19 The patient denies respiratory symptoms of COVID 19 at this time. The importance of social distancing was discussed today.   Assessment and Plan The patient's preventative maintenance and recommended screening tests for an annual wellness exam were reviewed in full today. Brought up to date unless services declined.  Counselled on the importance of diet, exercise, and its role in overall health and mortality. The patient's FH and SH was reviewed, including their home life, tobacco status, and drug and alcohol status.     Colon: 05/2024, plan repeat yearly Nonsmoker DEXA:  Normal 11/2022, repeat in 5 years Mammogram 02/21/2024 Mom, 3 maternal  aunts and 2 paternal aunts with breast cancer... would like to do MRI yearly alternating with mammogram. PAP not indicated, DVE  not indicated total hysterecotmy Vaccine: given  flu, COVID19  series , Tdap,  Shingrix and PNA. Hep C neg   Problem List Items Addressed This Visit     Hyperlipidemia associated with type 2 diabetes  mellitus (HCC)   Stable, chronic.  Continue current medication.    Crestor  10 mg daily      Hypertension associated with diabetes (HCC)    Poor control in office today, chronic.  Continue current medication.   Losartan  25 mg p.o. daily Metoprolol  100 mg in AM and  50 mg p.o. PM      Hypothyroidism   Stable, chronic.  Continue current medication.  Synthroid 75 mcg daily      Irritable bowel syndrome    Chronic... symptom sound very consistent with IBSC/D... will try trial of Amitiza Has had large GI eval in past... no final dx per pt.  Need to review specialist records that she will bring to follow up.      Relevant Medications   lubiprostone (AMITIZA) 8 MCG capsule   Microalbuminuria    Jardiance  10 mg daily      Type 2 diabetes mellitus with other circulatory complications HTN (HCC)   Chronic, associated with hypertension Stable.   Diet controlled.    No longer on metformin BID.  Jardiance  10 mg daily Followed by Dr. Tommas in past.      Other Visit Diagnoses       Need for influenza vaccination    -  Primary   Relevant Orders   Flu vaccine HIGH DOSE PF(Fluzone Trivalent) (Completed)     Family history of breast cancer       Relevant Orders   MR BREAST W & WO CM SCREENING (GI)          Greig Ring, MD

## 2024-04-16 NOTE — Assessment & Plan Note (Signed)
 Chronic... symptom sound very consistent with IBSC/D... will try trial of Amitiza Has had large GI eval in past... no final dx per pt.  Need to review specialist records that she will bring to follow up.

## 2024-04-16 NOTE — Assessment & Plan Note (Addendum)
 Chronic, associated with hypertension Stable.   Diet controlled.    No longer on metformin BID.  Jardiance  10 mg daily Followed by Dr. Tommas in past.

## 2024-04-16 NOTE — Assessment & Plan Note (Signed)
Stable, chronic.  Continue current medication.  Synthroid 75 mcg daily

## 2024-04-17 NOTE — Progress Notes (Signed)
 Valerie Vargas                                          MRN: [id number]   04/17/2024   The [facility] Quality Team Specialist reviewed this patient medical record for the purposes of chart review for care gap closure. The following were reviewed: abstraction for care gap closure-kidney health evaluation for diabetes:eGFR  and uACR.    [facility]BCI Quality Team

## 2024-05-14 ENCOUNTER — Encounter: Payer: Self-pay | Admitting: Family Medicine

## 2024-05-14 ENCOUNTER — Ambulatory Visit (INDEPENDENT_AMBULATORY_CARE_PROVIDER_SITE_OTHER): Admitting: Family Medicine

## 2024-05-14 ENCOUNTER — Telehealth: Payer: Self-pay

## 2024-05-14 VITALS — BP 170/80 | HR 72 | Temp 99.4°F | Ht 62.25 in | Wt 142.4 lb

## 2024-05-14 DIAGNOSIS — K582 Mixed irritable bowel syndrome: Secondary | ICD-10-CM | POA: Diagnosis not present

## 2024-05-14 MED ORDER — DICYCLOMINE HCL 10 MG PO CAPS: 10.0000 mg | ORAL_CAPSULE | Freq: Three times a day (TID) | ORAL | 1 refills | Status: DC

## 2024-05-14 MED ORDER — LUBIPROSTONE 8 MCG PO CAPS: 8.0000 ug | ORAL_CAPSULE | Freq: Every day | ORAL | 3 refills | Status: AC

## 2024-05-14 NOTE — Patient Instructions (Addendum)
 Add bentyl with meals for cramping pain from IBS.

## 2024-05-14 NOTE — Telephone Encounter (Signed)
 When I went to give pt her AVS, she asked if we or her GI had ever done pancreatic enzyme testing. Her GI is with [facility]. I told her we would have to look in to it to see and would get back to her. She said one of her other specialists had recommended it if they were not done.

## 2024-05-14 NOTE — Progress Notes (Signed)
 Patient ID: ABBEE Vargas, female    DOB: 22-Aug-1945, 78 y.o.   MRN: [id number]  This visit was conducted in person.  BP (!) 170/80   Pulse 72   Temp 99.4 F (37.4 C) (Temporal)   Ht 5' 2.25 (1.581 m)   Wt 142 lb 6 oz (64.6 kg)   SpO2 97%   BMI 25.83 kg/m    CC:  Chief Complaint  Patient presents with   Abdominal Pain    Follow up Amitiza start    Subjective:   HPI: Valerie Vargas is a 78 y.o. female presenting on 05/14/2024 for Abdominal Pain (Follow up Amitiza start)  At last OV on 9/303/2025  Discussed IBS C/D  Started trial of Amitiza... she feels like she has noted  more normal stools, more regular BMs.Valerie Vargas occurring every  2-3 days still. ON days when she has BM.Valerie Vargas starts as straining, frequent BMs through the day then moves to pudding like... this is  better.. no pudding like stool any longer  Less bloating. Still veggies cause bloating and pain.  Still some side  discomfort constant, worse pain some days... cramping with meals.   Strong family history of digestive issues.   On Jardiance   for microalbumin     Essentially eating the FODMAP diet.  Relevant past medical, surgical, family and social history reviewed and updated as indicated. Interim medical history since our last visit reviewed. Allergies and medications reviewed and updated. Outpatient Medications Prior to Visit  Medication Sig Dispense Refill   ALPRAZolam  (XANAX ) 0.5 MG tablet Take 1 tablet (0.5 mg total) by mouth daily as needed. 5 tablet 0   Ascorbic Acid (VITAMIN C) 1000 MG tablet Take 1,000 mg by mouth daily.     benzonatate  (TESSALON ) 100 MG capsule TAKE 2 CAPSULES BY MOUTH 3 TIMES A DAY AS NEEDED FOR COUGH 60 capsule 1   blood glucose meter kit and supplies KIT Dispense based on patient and insurance preference. Use up to four times daily as directed. (FOR ICD-9 250.00, 250.01). 1 each 0   Cholecalciferol (VITAMIN D3) 1000 UNITS CAPS Take 1 capsule by mouth daily.     empagliflozin   (JARDIANCE ) 10 MG TABS tablet Take 1 tablet (10 mg total) by mouth daily before breakfast. 90 tablet 3   gabapentin (NEURONTIN) 100 MG capsule TAKE 3 CAPSULES BY MOUTH 3 TIMES DAILY 270 capsule 0   glucose blood (ONE TOUCH ULTRA TEST) test strip -TEST TWICE DAILY 200 each 3   Guaifenesin  1200 MG TB12 Take by mouth as needed.     Lancets (ONETOUCH ULTRASOFT) lancets Use to check blood sugar 2 times daily 200 each 3   loratadine (CLARITIN) 10 MG tablet Take 10 mg by mouth daily.     metoprolol  succinate (TOPROL -XL) 50 MG 24 hr tablet Take 2 tablets( 100 mg )by mouth in the morning, and 1 tablet by mouth (50 mg ) at night.     OMEGA-3 1000 MG CAPS Take 1,000 mg by mouth daily.     Pyridoxine HCl (VITAMIN B-6 PO) Take 1 tablet by mouth daily.     rosuvastatin  (CRESTOR ) 10 MG tablet TAKE 1 TABLET BY MOUTH EVERY DAY FOR CHOLESTEROL 90 tablet 3   SYNTHROID 75 MCG tablet Take 75 mcg by mouth daily before breakfast.     Thiamine HCl (VITAMIN B-1) 250 MG tablet Take 250 mg by mouth daily.     vitamin B-12 (CYANOCOBALAMIN) 1000 MCG tablet Take 1,000 mcg by mouth daily.  diphenhydrAMINE-PE-APAP 12.5-5-325 MG TABS Take 1 tablet by mouth at bedtime as needed.     fluconazole  (DIFLUCAN ) 150 MG tablet Take 1 tab po today, repeat in 2 days if needed 2 tablet 0   lubiprostone (AMITIZA) 8 MCG capsule Take 1 capsule (8 mcg total) by mouth daily with breakfast. 30 capsule 0   No facility-administered medications prior to visit.     Per HPI unless specifically indicated in ROS section below Review of Systems  Constitutional:  Negative for fatigue and fever.  HENT:  Negative for congestion.   Eyes:  Negative for pain.  Respiratory:  Negative for cough and shortness of breath.   Cardiovascular:  Negative for chest pain, palpitations and leg swelling.  Gastrointestinal:  Negative for abdominal pain.  Genitourinary:  Negative for dysuria and vaginal bleeding.  Musculoskeletal:  Negative for back pain.   Neurological:  Negative for syncope, light-headedness and headaches.  Psychiatric/Behavioral:  Negative for dysphoric mood.    Objective:  BP (!) 170/80   Pulse 72   Temp 99.4 F (37.4 C) (Temporal)   Ht 5' 2.25 (1.581 m)   Wt 142 lb 6 oz (64.6 kg)   SpO2 97%   BMI 25.83 kg/m   Wt Readings from Last 3 Encounters:  05/14/24 142 lb 6 oz (64.6 kg)  04/16/24 136 lb 4 oz (61.8 kg)  03/05/24 130 lb (59 kg)      Physical Exam Constitutional:      General: She is not in acute distress.    Appearance: Normal appearance. She is well-developed. She is not ill-appearing or toxic-appearing.  HENT:     Head: Normocephalic.     Right Ear: Hearing, tympanic membrane, ear canal and external ear normal. Tympanic membrane is not erythematous, retracted or bulging.     Left Ear: Hearing, tympanic membrane, ear canal and external ear normal. Tympanic membrane is not erythematous, retracted or bulging.     Nose: No mucosal edema or rhinorrhea.     Right Sinus: No maxillary sinus tenderness or frontal sinus tenderness.     Left Sinus: No maxillary sinus tenderness or frontal sinus tenderness.     Mouth/Throat:     Pharynx: Uvula midline.  Eyes:     General: Lids are normal. Lids are everted, no foreign bodies appreciated.     Conjunctiva/sclera: Conjunctivae normal.     Pupils: Pupils are equal, round, and reactive to light.  Neck:     Thyroid : No thyroid  mass or thyromegaly.     Vascular: No carotid bruit.     Trachea: Trachea normal.  Cardiovascular:     Rate and Rhythm: Normal rate and regular rhythm.     Pulses: Normal pulses.     Heart sounds: Normal heart sounds, S1 normal and S2 normal. No murmur heard.    No friction rub. No gallop.  Pulmonary:     Effort: Pulmonary effort is normal. No tachypnea or respiratory distress.     Breath sounds: Normal breath sounds. No decreased breath sounds, wheezing, rhonchi or rales.  Abdominal:     General: Bowel sounds are normal.      Palpations: Abdomen is soft.     Tenderness: There is generalized abdominal tenderness.  Musculoskeletal:     Cervical back: Normal range of motion and neck supple.  Skin:    General: Skin is warm and dry.     Findings: No rash.  Neurological:     Mental Status: She is alert.  Psychiatric:  Mood and Affect: Mood is not anxious or depressed.        Speech: Speech normal.        Behavior: Behavior normal. Behavior is cooperative.        Thought Content: Thought content normal.        Judgment: Judgment normal.       Results for orders placed or performed in visit on 04/16/24  HM DIABETES FOOT EXAM   Collection Time: 04/16/24 12:00 AM  Result Value Ref Range   HM Diabetic Foot Exam done     Assessment and Plan  Irritable bowel syndrome with both constipation and diarrhea Assessment & Plan: Chronic, some benefit from Amitiza 8 mcg start.. more regular consistency of BMS, and decrease bloating but still days of cramping and pain and days of more frequent BMs.  Discussed reviewing FODMAP diet and avoidance of irritating foods. Given cramping is most significant issue... will start a trial of prn hyoscamine with meals.  She will call with an update in 1 month.    Return and ER precautions given.   Other orders -     Dicyclomine HCl; Take 1 capsule (10 mg total) by mouth 4 (four) times daily -  before meals and at bedtime.  Dispense: 90 capsule; Refill: 1 -     Lubiprostone; Take 1 capsule (8 mcg total) by mouth daily with breakfast.  Dispense: 30 capsule; Refill: 3    No follow-ups on file.   Greig Ring, MD

## 2024-05-14 NOTE — Assessment & Plan Note (Signed)
 Chronic, some benefit from Amitiza 8 mcg start.. more regular consistency of BMS, and decrease bloating but still days of cramping and pain and days of more frequent BMs.  Discussed reviewing FODMAP diet and avoidance of irritating foods. Given cramping is most significant issue... will start a trial of prn hyoscamine with meals.  She will call with an update in 1 month.    Return and ER precautions given.

## 2024-05-14 NOTE — Telephone Encounter (Signed)
 In the [facility], I found 02-22-21 Pancreatic Elastase Stool Test. 06-24-2010 Amylase and Lipase.   09-03-20 you ordered a Lipase

## 2024-05-15 ENCOUNTER — Other Ambulatory Visit (HOSPITAL_COMMUNITY): Payer: Self-pay

## 2024-05-15 ENCOUNTER — Telehealth: Payer: Self-pay

## 2024-05-15 NOTE — Telephone Encounter (Signed)
 Pharmacy Patient Advocate Encounter   Received notification from Onbase that prior authorization for Dicyclomine Hcl 10 caps is required/requested.   Insurance verification completed.   The patient is insured through [facility].   Per test claim: PA required; PA submitted to above mentioned insurance via Latent Key/confirmation #/EOC [facility] Status is pending

## 2024-05-15 NOTE — Telephone Encounter (Signed)
 Agreed -

## 2024-05-15 NOTE — Telephone Encounter (Signed)
 Valerie Vargas notified by telephone that she has had that test done at [facility] 02/22/2021.

## 2024-05-16 NOTE — Telephone Encounter (Signed)
 Noted. Will review paperwork.

## 2024-05-16 NOTE — Telephone Encounter (Signed)
 Copied from CRM [id number]. Topic: Referral - Prior Authorization Question >> May 15, 2024  4:27 PM Roselie BROCKS wrote: Reason for CRM: [employer] needs prior autherization on  Dicyclomine Hcl 10 caps , she states provider did not go over risks or side effects of medication and it has been deemed not safe. Paitent meets high risk for this medication. [employer] will fax information as well.

## 2024-05-17 ENCOUNTER — Other Ambulatory Visit (HOSPITAL_COMMUNITY): Payer: Self-pay

## 2024-05-22 DIAGNOSIS — E039 Hypothyroidism, unspecified: Secondary | ICD-10-CM | POA: Diagnosis not present

## 2024-05-22 DIAGNOSIS — R7989 Other specified abnormal findings of blood chemistry: Secondary | ICD-10-CM | POA: Diagnosis not present

## 2024-05-22 DIAGNOSIS — E1165 Type 2 diabetes mellitus with hyperglycemia: Secondary | ICD-10-CM | POA: Diagnosis not present

## 2024-05-22 DIAGNOSIS — I10 Essential (primary) hypertension: Secondary | ICD-10-CM | POA: Diagnosis not present

## 2024-05-30 ENCOUNTER — Encounter: Payer: Self-pay | Admitting: Pharmacist

## 2024-05-30 NOTE — Progress Notes (Signed)
 Pharmacy Quality Measure Review  This patient is appearing on a report for being at risk of failing the Kidney Health Evaluation for Patients with Diabetes measure this calendar year.   Last documented UACR  Lab Results  Component Value Date   MICRALBCREAT 34.9 (H) 04/11/2024   Lab Results  Component Value Date   GFR 62.33 04/11/2024   Measure criteria met.

## 2024-06-04 DIAGNOSIS — K080 Exfoliation of teeth due to systemic causes: Secondary | ICD-10-CM | POA: Diagnosis not present

## 2024-06-04 NOTE — Telephone Encounter (Signed)
 I believe I did review side effects when patient was in office.  Please contact patient and make sure she knows the following:  This medication is on the [place] list and should be avoided in patients 32 and older due to its possible association with sedation.   Avoid in patients with delirium, dementia, or in males with lower urinary tract symptoms or benign prostatic hyperplasia. Avoid in patients with a history of falls and fractures unless safer alternatives are not available.  If she tries this medication and has somnolence/oversedation she should stop.  Then let the insurance know.

## 2024-06-04 NOTE — Telephone Encounter (Addendum)
 Spoke with Mrs. Zingale are read risk provided by Dr. Avelina.  Patient is currently taking the medication because she paid out of pocket ($22) so she could go ahead and start it.  She does feel like it is helping.  Left message for E[place]ia with [employer] that yes risk of taking dicyclomine was discussed with patient.

## 2024-06-04 NOTE — Telephone Encounter (Signed)
 Copied from CRM [id number]. Topic: General - Other >> Jun 04, 2024  1:20 PM Valerie Vargas wrote: Reason for CRM: Patient returning call from [facility]. Called CAL and advised to send a message for a call back.

## 2024-06-04 NOTE — Telephone Encounter (Signed)
 Left message for Valerie Vargas to return call to office.

## 2024-06-04 NOTE — Telephone Encounter (Signed)
 Copied from CRM #[id number]. Topic: Clinical - Medication Question >> Jun 04, 2024  8:24 AM Eva FALCON wrote: Reason for CRM: Etopia from [facility] was calling in, states she is working on appeal for pt's Dicyclomine. She states pharmacist had a question if the risk and side effects were gone over with pt. Please call Etopia to confirm at [phone].

## 2024-06-05 DIAGNOSIS — R140 Abdominal distension (gaseous): Secondary | ICD-10-CM | POA: Diagnosis not present

## 2024-06-05 DIAGNOSIS — Z1211 Encounter for screening for malignant neoplasm of colon: Secondary | ICD-10-CM | POA: Diagnosis not present

## 2024-06-05 DIAGNOSIS — R1031 Right lower quadrant pain: Secondary | ICD-10-CM | POA: Diagnosis not present

## 2024-06-05 DIAGNOSIS — K582 Mixed irritable bowel syndrome: Secondary | ICD-10-CM | POA: Diagnosis not present

## 2024-06-05 DIAGNOSIS — D369 Benign neoplasm, unspecified site: Secondary | ICD-10-CM | POA: Diagnosis not present

## 2024-06-18 DIAGNOSIS — K080 Exfoliation of teeth due to systemic causes: Secondary | ICD-10-CM | POA: Diagnosis not present

## 2024-06-23 DIAGNOSIS — R5383 Other fatigue: Secondary | ICD-10-CM | POA: Diagnosis not present

## 2024-06-23 DIAGNOSIS — R008 Other abnormalities of heart beat: Secondary | ICD-10-CM | POA: Diagnosis not present

## 2024-06-23 DIAGNOSIS — R001 Bradycardia, unspecified: Secondary | ICD-10-CM | POA: Diagnosis not present

## 2024-06-23 DIAGNOSIS — I498 Other specified cardiac arrhythmias: Secondary | ICD-10-CM | POA: Diagnosis not present

## 2024-06-23 DIAGNOSIS — I493 Ventricular premature depolarization: Secondary | ICD-10-CM | POA: Diagnosis not present

## 2024-06-23 DIAGNOSIS — R42 Dizziness and giddiness: Secondary | ICD-10-CM | POA: Diagnosis not present

## 2024-06-23 DIAGNOSIS — Z79899 Other long term (current) drug therapy: Secondary | ICD-10-CM | POA: Diagnosis not present

## 2024-06-23 DIAGNOSIS — I10 Essential (primary) hypertension: Secondary | ICD-10-CM | POA: Diagnosis not present

## 2024-06-23 DIAGNOSIS — R9431 Abnormal electrocardiogram [ECG] [EKG]: Secondary | ICD-10-CM | POA: Diagnosis not present

## 2024-06-24 DIAGNOSIS — I498 Other specified cardiac arrhythmias: Secondary | ICD-10-CM | POA: Diagnosis not present

## 2024-06-24 DIAGNOSIS — I499 Cardiac arrhythmia, unspecified: Secondary | ICD-10-CM | POA: Diagnosis not present

## 2024-06-27 ENCOUNTER — Encounter: Payer: Self-pay | Admitting: Family Medicine

## 2024-06-27 ENCOUNTER — Ambulatory Visit: Admitting: Family Medicine

## 2024-06-27 VITALS — BP 138/84 | HR 69 | Temp 98.0°F | Ht 62.25 in | Wt 139.4 lb

## 2024-06-27 DIAGNOSIS — R55 Syncope and collapse: Secondary | ICD-10-CM | POA: Diagnosis not present

## 2024-06-27 DIAGNOSIS — E1159 Type 2 diabetes mellitus with other circulatory complications: Secondary | ICD-10-CM | POA: Diagnosis not present

## 2024-06-27 DIAGNOSIS — I152 Hypertension secondary to endocrine disorders: Secondary | ICD-10-CM | POA: Diagnosis not present

## 2024-06-27 DIAGNOSIS — R001 Bradycardia, unspecified: Secondary | ICD-10-CM | POA: Insufficient documentation

## 2024-06-27 DIAGNOSIS — Z7984 Long term (current) use of oral hypoglycemic drugs: Secondary | ICD-10-CM

## 2024-06-27 NOTE — Assessment & Plan Note (Addendum)
 Acute, recent syncope concerning to be secondary to recent bradycardia.  She appears well-hydrated and is drinking lots of water.  Blood pressure in the office and heart rate within the normal range. Reviewed recent extensive lab workup and ED.

## 2024-06-27 NOTE — Progress Notes (Signed)
 Patient ID: Valerie Vargas, female    DOB: Jan 25, 1946, 78 y.o.   MRN: [id number]  This visit was conducted in person.  BP 138/84 (BP Location: Left Arm, Patient Position: Sitting, Cuff Size: Normal)   Pulse 69   Temp 98 F (36.7 C) (Temporal)   Ht 5' 2.25 (1.581 m)   Wt 139 lb 6.4 oz (63.2 kg)   SpO2 98%   BMI 25.29 kg/m    CC:  Chief Complaint  Patient presents with   Hospitalization Follow-up    Subjective:   HPI: Valerie Vargas is a 78 y.o. female presenting on 06/27/2024 for Hospitalization Follow-up   Hospital admission  for observation  [facility]: 06/23/2024  Discharged 12/8  Admitted for lightheadedness and bigemminy Presented with complaints of lightheadedness intermittently so for several weeks.  Her watch showed heart rate in 30s to 40s. Seen in urgent care.  EKG showed bigeminy and patient was sent to the ER.  Blood pressure elevated at 218/112 Labs: Creatinine 0.64, normal CBC, normal liver function, troponin 2-hour normal, thyroid  normal Hold beta-blocker that she was on for PSVT Echocardiogram June 24, 2024 left ventricular ejection fraction 55 to 60% no apparent valvular abnormalities   Given telmisartan 20 mg for BP elevated but she has not started given BP has been normal.  Day after discharge... had syncopal spell  when taking a shower. NO LOC.. husband caught her.   Good Po intake, drinking lots of water.  Has chronic diarrhea: Abd Pelvic Ct done yesterday per GI.  Daily diarrhea.  Has appt with Dr. Kennyth 12/17     past medical, surgical, family and social history reviewed and updated as indicated. Interim medical history since our last visit reviewed. Allergies and medications reviewed and updated. Outpatient Medications Prior to Visit  Medication Sig Dispense Refill   ALPRAZolam  (XANAX ) 0.5 MG tablet Take 1 tablet (0.5 mg total) by mouth daily as needed. 5 tablet 0   Ascorbic Acid (VITAMIN C) 1000 MG tablet Take 1,000 mg by mouth  daily.     benzonatate  (TESSALON ) 100 MG capsule TAKE 2 CAPSULES BY MOUTH 3 TIMES A DAY AS NEEDED FOR COUGH 60 capsule 1   blood glucose meter kit and supplies KIT Dispense based on patient and insurance preference. Use up to four times daily as directed. (FOR ICD-9 250.00, 250.01). 1 each 0   Cholecalciferol (VITAMIN D3) 1000 UNITS CAPS Take 1 capsule by mouth daily.     dicyclomine  (BENTYL ) 10 MG capsule Take 1 capsule (10 mg total) by mouth 4 (four) times daily -  before meals and at bedtime. 90 capsule 1   empagliflozin  (JARDIANCE ) 10 MG TABS tablet Take 1 tablet (10 mg total) by mouth daily before breakfast. 90 tablet 3   gabapentin (NEURONTIN) 100 MG capsule TAKE 3 CAPSULES BY MOUTH 3 TIMES DAILY 270 capsule 0   glucose blood (ONE TOUCH ULTRA TEST) test strip -TEST TWICE DAILY 200 each 3   Guaifenesin  1200 MG TB12 Take by mouth as needed.     Lancets (ONETOUCH ULTRASOFT) lancets Use to check blood sugar 2 times daily 200 each 3   loratadine (CLARITIN) 10 MG tablet Take 10 mg by mouth daily.     lubiprostone  (AMITIZA ) 8 MCG capsule Take 1 capsule (8 mcg total) by mouth daily with breakfast. 30 capsule 3   metoprolol  succinate (TOPROL -XL) 50 MG 24 hr tablet Take 2 tablets( 100 mg )by mouth in the morning, and 1 tablet by mouth (50  mg ) at night.     OMEGA-3 1000 MG CAPS Take 1,000 mg by mouth daily.     Pyridoxine HCl (VITAMIN B-6 PO) Take 1 tablet by mouth daily.     rosuvastatin  (CRESTOR ) 10 MG tablet TAKE 1 TABLET BY MOUTH EVERY DAY FOR CHOLESTEROL 90 tablet 3   SYNTHROID 75 MCG tablet Take 75 mcg by mouth daily before breakfast.     Thiamine HCl (VITAMIN B-1) 250 MG tablet Take 250 mg by mouth daily.     vitamin B-12 (CYANOCOBALAMIN) 1000 MCG tablet Take 1,000 mcg by mouth daily.     No facility-administered medications prior to visit.     Per HPI unless specifically indicated in ROS section below Review of Systems  Constitutional:  Negative for fatigue and fever.  HENT:  Negative  for congestion.   Eyes:  Negative for pain.  Respiratory:  Negative for cough and shortness of breath.   Cardiovascular:  Negative for chest pain, palpitations and leg swelling.  Gastrointestinal:  Negative for abdominal pain.  Genitourinary:  Negative for dysuria and vaginal bleeding.  Musculoskeletal:  Negative for back pain.  Neurological:  Negative for syncope, light-headedness and headaches.  Psychiatric/Behavioral:  Negative for dysphoric mood.    Objective:  BP 138/84 (BP Location: Left Arm, Patient Position: Sitting, Cuff Size: Normal)   Pulse 69   Temp 98 F (36.7 C) (Temporal)   Ht 5' 2.25 (1.581 m)   Wt 139 lb 6.4 oz (63.2 kg)   SpO2 98%   BMI 25.29 kg/m   Wt Readings from Last 3 Encounters:  06/27/24 139 lb 6.4 oz (63.2 kg)  05/14/24 142 lb 6 oz (64.6 kg)  04/16/24 136 lb 4 oz (61.8 kg)      Physical Exam Constitutional:      General: She is not in acute distress.    Appearance: Normal appearance. She is well-developed. She is not ill-appearing or toxic-appearing.  HENT:     Head: Normocephalic.     Right Ear: Hearing, tympanic membrane, ear canal and external ear normal. Tympanic membrane is not erythematous, retracted or bulging.     Left Ear: Hearing, tympanic membrane, ear canal and external ear normal. Tympanic membrane is not erythematous, retracted or bulging.     Nose: No mucosal edema or rhinorrhea.     Right Sinus: No maxillary sinus tenderness or frontal sinus tenderness.     Left Sinus: No maxillary sinus tenderness or frontal sinus tenderness.     Mouth/Throat:     Pharynx: Uvula midline.  Eyes:     General: Lids are normal. Lids are everted, no foreign bodies appreciated.     Conjunctiva/sclera: Conjunctivae normal.     Pupils: Pupils are equal, round, and reactive to light.  Neck:     Thyroid : No thyroid  mass or thyromegaly.     Vascular: No carotid bruit.     Trachea: Trachea normal.  Cardiovascular:     Rate and Rhythm: Normal rate and  regular rhythm.     Pulses: Normal pulses.     Heart sounds: Normal heart sounds, S1 normal and S2 normal. No murmur heard.    No friction rub. No gallop.  Pulmonary:     Effort: Pulmonary effort is normal. No tachypnea or respiratory distress.     Breath sounds: Normal breath sounds. No decreased breath sounds, wheezing, rhonchi or rales.  Abdominal:     General: Bowel sounds are normal.     Palpations: Abdomen is soft.  Tenderness: There is no abdominal tenderness.  Musculoskeletal:     Cervical back: Normal range of motion and neck supple.  Skin:    General: Skin is warm and dry.     Findings: No rash.  Neurological:     Mental Status: She is alert.  Psychiatric:        Mood and Affect: Mood is not anxious or depressed.        Speech: Speech normal.        Behavior: Behavior normal. Behavior is cooperative.        Thought Content: Thought content normal.        Judgment: Judgment normal.       Results for orders placed or performed in visit on 04/16/24  HM DIABETES FOOT EXAM   Collection Time: 04/16/24 12:00 AM  Result Value Ref Range   HM Diabetic Foot Exam done     Assessment and Plan  Syncope, unspecified syncope type Assessment & Plan: Acute, recent syncope concerning to be secondary to recent bradycardia.  She appears well-hydrated and is drinking lots of water.  Blood pressure in the office and heart rate within the normal range. Reviewed recent extensive lab workup and ED.   Bradycardia Assessment & Plan: Acute, likely in part secondary to beta-blocker.  Cardiology in hospital recommended that she continue current dose though. She does have an appointment upcoming with her cardiologist Dr. Kennyth.   Hypertension associated with diabetes [facility]) Assessment & Plan: Chronic with acute recent elevation in ED.  Blood pressure no running well-controlled.  Patient never started telmisartan given in the hospital.  I encouraged her to continue holding this at the  time unless blood pressure spiking up. Continue current dose of metoprolol  until follow-up with cardiology.     No follow-ups on file.   Greig Ring, MD

## 2024-06-27 NOTE — Assessment & Plan Note (Signed)
 Acute, likely in part secondary to beta-blocker.  Cardiology in hospital recommended that she continue current dose though. She does have an appointment upcoming with her cardiologist Dr. Kennyth.

## 2024-06-27 NOTE — Assessment & Plan Note (Signed)
 Chronic with acute recent elevation in ED.  Blood pressure no running well-controlled.  Patient never started telmisartan given in the hospital.  I encouraged her to continue holding this at the time unless blood pressure spiking up. Continue current dose of metoprolol  until follow-up with cardiology.

## 2024-07-03 ENCOUNTER — Ambulatory Visit

## 2024-07-03 ENCOUNTER — Ambulatory Visit: Attending: Cardiovascular Disease | Admitting: Cardiovascular Disease

## 2024-07-03 ENCOUNTER — Encounter: Payer: Self-pay | Admitting: Cardiovascular Disease

## 2024-07-03 ENCOUNTER — Other Ambulatory Visit: Payer: Self-pay | Admitting: Family Medicine

## 2024-07-03 VITALS — BP 138/85 | HR 72 | Ht 62.0 in | Wt 138.0 lb

## 2024-07-03 DIAGNOSIS — E236 Other disorders of pituitary gland: Secondary | ICD-10-CM

## 2024-07-03 DIAGNOSIS — R001 Bradycardia, unspecified: Secondary | ICD-10-CM

## 2024-07-03 DIAGNOSIS — I493 Ventricular premature depolarization: Secondary | ICD-10-CM | POA: Diagnosis not present

## 2024-07-03 DIAGNOSIS — I471 Supraventricular tachycardia, unspecified: Secondary | ICD-10-CM

## 2024-07-03 DIAGNOSIS — E1159 Type 2 diabetes mellitus with other circulatory complications: Secondary | ICD-10-CM | POA: Diagnosis not present

## 2024-07-03 DIAGNOSIS — G4733 Obstructive sleep apnea (adult) (pediatric): Secondary | ICD-10-CM

## 2024-07-03 DIAGNOSIS — R55 Syncope and collapse: Secondary | ICD-10-CM

## 2024-07-03 DIAGNOSIS — R5383 Other fatigue: Secondary | ICD-10-CM | POA: Diagnosis not present

## 2024-07-03 DIAGNOSIS — I10 Essential (primary) hypertension: Secondary | ICD-10-CM

## 2024-07-03 DIAGNOSIS — E782 Mixed hyperlipidemia: Secondary | ICD-10-CM | POA: Diagnosis not present

## 2024-07-03 MED ORDER — TELMISARTAN 20 MG PO TABS: 20.0000 mg | ORAL_TABLET | Freq: Every day | ORAL | 3 refills | Status: AC

## 2024-07-03 NOTE — Progress Notes (Unsigned)
 Enrolled for [employer] to mail a ZIO XT long term holter monitor to the patients address on file.

## 2024-07-03 NOTE — Patient Instructions (Addendum)
 Medication Instructions:  Stop Jardiance  Start Telmisartan  20 mg daily *If you need a refill on your cardiac medications before your next appointment, please call your pharmacy*  Send in a BP log in 10 days, please.  Lab Work: None ordered If you have labs (blood work) drawn today and your tests are completely normal, you will receive your results only by: MyChart Message (if you have MyChart) OR A paper copy in the mail If you have any lab test that is abnormal or we need to change your treatment, we will call you to review the results.  Testing/Procedures: Your physician has recommended that you wear a 3 DAY ZIO-PATCH monitor. The Zio patch cardiac monitor continuously records heart rhythm data for up to 14 days, this is for patients being evaluated for multiple types heart rhythms. For the first 24 hours post application, please avoid getting the Zio monitor wet in the shower or by excessive sweating during exercise. After that, feel free to carry on with regular activities. Keep soaps and lotions away from the ZIO XT Patch.  This will be mailed to you, please expect 7-10 days to receive.    Applying the monitor   Shave hair from upper left chest.   Hold abrader disc by orange tab.  Rub abrader in 40 strokes over left upper chest as indicated in your monitor instructions.   Clean area with 4 enclosed alcohol pads .  Use all pads to assure are is cleaned thoroughly.  Let dry.   Apply patch as indicated in monitor instructions.  Patch will be place under collarbone on left side of chest with arrow pointing upward.   Rub patch adhesive wings for 2 minutes.Remove white label marked 1.  Remove white label marked 2.  Rub patch adhesive wings for 2 additional minutes.   While looking in a mirror, press and release button in center of patch.  A small green light will flash 3-4 times .  This will be your only indicator the monitor has been turned on.     Do not shower for the first 24  hours.  You may shower after the first 24 hours.   Press button if you feel a symptom. You will hear a small click.  Record Date, Time and Symptom in the Patient Log Book.   When you are ready to remove patch, follow instructions on last 2 pages of Patient Log Book.  Stick patch monitor onto last page of Patient Log Book.   Place Patient Log Book in [place] box.  Use locking tab on box and tape box closed securely.  The [employer] has [employer] on it.  Please place in mailbox as soon as possible.  Your physician should have your test results approximately 7 days after the monitor has been mailed back to [facility].   Call [facility] Customer Care at [phone] if you have questions regarding your ZIO XT patch monitor.  Call them immediately if you see an orange light blinking on your monitor.   If your monitor falls off in less than 4 days contact our Monitor department at [phone].  If your monitor becomes loose or falls off after 4 days call [employer] at [phone] for suggestions on securing your monitor   Follow-Up: At [facility], you and your health needs are our priority.  As part of our continuing mission to provide you with exceptional heart care, our providers are all part of one team.  This team includes  your primary Cardiologist (physician) and Advanced Practice Providers or APPs (Physician Assistants and Nurse Practitioners) who all work together to provide you with the care you need, when you need it.  Your next appointment:   6 month(s)  Provider:   Jerel Balding, MD    We recommend signing up for the patient portal called MyChart.  Sign up information is provided on this After Visit Summary.  MyChart is used to connect with patients for Virtual Visits (Telemedicine).  Patients are able to view lab/test results, encounter notes, upcoming appointments, etc.  Non-urgent messages can be sent to your provider as well.   To learn more about  what you can do with MyChart, go to [email or web address].

## 2024-07-03 NOTE — Progress Notes (Unsigned)
 Patient ID: Valerie Vargas, female   DOB: [birth year], 78 y.o.   MRN: [id number]    Cardiology Office Note    Date:  07/03/2024   ID:  Valerie, Vargas [birth year], MRN [id number]  PCP:  Avelina Greig BRAVO, MD  Cardiologist:   Jerel Balding, MD   No chief complaint on file.   History of Present Illness:  Valerie Vargas is a 78 y.o. female with OSA, a remote history of syncope in 2007, that recurred following a hot shower in 2022 , minor coronary artery disease by angiography in 2003, hypertension, hyperlipidemia, type 2 diabetes mellitus, neurally mediated syncope, SVT.    She presented in SVT at a rate of 142 bpm with prominent retrograde P waves at her previous appointment, suggesting junctional tachycardia or atypical AVNRT.  The arrhythmia resolved and extra dose of beta-blocker and no recurrence was seen where she wore an event monitor.  She has not had any new episodes of sustained SVT and in general her palpitations have not been very bothersome.  She denies exertional dyspnea orthopnea PND or lower extremity edema.  She has not had any chest pain.  Blood pressure was initially high when she checked in but was much better when I rechecked it later and is usually normal at home.  She has not had any episodes of syncope or near syncope (in the past she has had a full syncopal event with head injury and another two near syncopal events after taking warm showers; had another remote syncopal event 15 years ago).  A home sleep study described as having remarkably severe OSA with an AHI of 38.7, but Dr. Jude has expressed skepticism and the accuracy of the test and has ordered a full conventional sleep study.  She has normal renal function.  She has good glycemic control with a hemoglobin A1c of 6.6%.  Cholesterol parameters are satisfactory with an HDL of 49 and LDL of 76, but her triglycerides were quite high at 511.  She has had vision problems related to a macular hole in her dominant  right eye (her left eye has problems due to a childhood injury).  Even after cataract surgery she still only has 20/80 vision and she has been avoiding driving.  She has seen a retina specialist.  She was incidentally found to have empty sella syndrome when she had her CT for head injury in 2022.  In the past a tilt table test demonstrated that she had orthostatic hypotension. Normal Vascuscreen (minimal carotid plaque, normal ABI, no AAA) in May 2018.    Past Medical History:  Diagnosis Date   Allergy    Allergy, unspecified not elsewhere classified    Arthritis    Asthma    Chronic interstitial cystitis    Cough    Esophageal reflux    H/O syncope 07/18/2005   No recurrence;Tilt table 09/23/05-negative   Hyperlipidemia 08/22/2014   Hypertension    Hypertriglyceridemia    Irritable bowel syndrome    Nonocclusive coronary atherosclerosis of native coronary artery    Obstructive sleep apnea (adult) (pediatric)    Paroxysmal ventricular tachycardia (HCC)    Seizures (HCC)    Sleep apnea    Type II or unspecified type diabetes mellitus without mention of complication, not stated as uncontrolled    Unspecified asthma(493.90)    Unspecified hypothyroidism    Ventricular tachycardia [facility])     Past Surgical History:  Procedure Laterality Date   ABDOMINAL HYSTERECTOMY  11/16/1979  for endometriosis   CHOLECYSTECTOMY  07/18/2000   COSMETIC SURGERY     When I fell and had to have surgery to repair the damage   ESOPHAGOGASTRODUODENOSCOPY ENDOSCOPY  07/18/2008   EYE SURGERY Left 01/30/2022   FACIAL NERVE SURGERY     12/2021    Outpatient Medications Prior to Visit  Medication Sig Dispense Refill   ALPRAZolam  (XANAX ) 0.5 MG tablet Take 1 tablet (0.5 mg total) by mouth daily as needed. 5 tablet 0   Ascorbic Acid (VITAMIN C) 1000 MG tablet Take 1,000 mg by mouth daily.     benzonatate  (TESSALON ) 100 MG capsule TAKE 2 CAPSULES BY MOUTH 3 TIMES A DAY AS NEEDED FOR COUGH 60 capsule 1    blood glucose meter kit and supplies KIT Dispense based on patient and insurance preference. Use up to four times daily as directed. (FOR ICD-9 250.00, 250.01). 1 each 0   Cholecalciferol (VITAMIN D3) 1000 UNITS CAPS Take 1 capsule by mouth daily.     dicyclomine  (BENTYL ) 10 MG capsule Take 1 capsule (10 mg total) by mouth 4 (four) times daily -  before meals and at bedtime. 90 capsule 1   empagliflozin  (JARDIANCE ) 10 MG TABS tablet Take 1 tablet (10 mg total) by mouth daily before breakfast. 90 tablet 3   gabapentin (NEURONTIN) 100 MG capsule TAKE 3 CAPSULES BY MOUTH 3 TIMES DAILY 270 capsule 0   glucose blood (ONE TOUCH ULTRA TEST) test strip -TEST TWICE DAILY 200 each 3   Guaifenesin  1200 MG TB12 Take by mouth as needed.     Lancets (ONETOUCH ULTRASOFT) lancets Use to check blood sugar 2 times daily 200 each 3   loratadine (CLARITIN) 10 MG tablet Take 10 mg by mouth daily.     lubiprostone  (AMITIZA ) 8 MCG capsule Take 1 capsule (8 mcg total) by mouth daily with breakfast. 30 capsule 3   metoprolol  succinate (TOPROL -XL) 50 MG 24 hr tablet Take 2 tablets( 100 mg )by mouth in the morning, and 1 tablet by mouth (50 mg ) at night.     OMEGA-3 1000 MG CAPS Take 1,000 mg by mouth daily.     Pyridoxine HCl (VITAMIN B-6 PO) Take 1 tablet by mouth daily.     rosuvastatin  (CRESTOR ) 10 MG tablet TAKE 1 TABLET BY MOUTH EVERY DAY FOR CHOLESTEROL 90 tablet 3   SYNTHROID 75 MCG tablet Take 75 mcg by mouth daily before breakfast.     Thiamine HCl (VITAMIN B-1) 250 MG tablet Take 250 mg by mouth daily.     vitamin B-12 (CYANOCOBALAMIN) 1000 MCG tablet Take 1,000 mcg by mouth daily.     No facility-administered medications prior to visit.     Allergies:   Elmiron [pentosan polysulfate sodium], Hctz [hydrochlorothiazide], Levothyroxine sodium, Penicillins, and Sulfa antibiotics   Family History:  The patient's family history includes Arthritis in her mother; Breast cancer in her maternal aunt, mother, and  paternal aunt; Cancer in her brother, father, maternal aunt, maternal grandfather, mother, paternal aunt, paternal aunt, and paternal grandfather; Diabetes in her father, mother, and paternal aunt; Early death in her brother; Heart disease in her father and maternal aunt; Hypertension in her father, maternal aunt, and paternal grandmother; Leukemia in her brother; Stomach cancer in her father; Stroke in her paternal grandmother; Varicose Veins in her paternal aunt and paternal aunt.   PHYSICAL EXAM:   VS:  BP (!) 170/80   Pulse 72   Ht 5' 2 (1.575 m)   Wt 138 lb (  62.6 kg)   SpO2 96%   BMI 25.24 kg/m      General: Alert, oriented x3, no distress, appears lean and fit Head: no evidence of trauma, PERRL, EOMI, no exophtalmos or lid lag, no myxedema, no xanthelasma; normal ears, nose and oropharynx Neck: normal jugular venous pulsations and no hepatojugular reflux; brisk carotid pulses without delay and no carotid bruits Chest: clear to auscultation, no signs of consolidation by percussion or palpation, normal fremitus, symmetrical and full respiratory excursions Cardiovascular: normal position and quality of the apical impulse, regular rhythm, normal first and second heart sounds, no murmurs, rubs or gallops Abdomen: no tenderness or distention, no masses by palpation, no abnormal pulsatility or arterial bruits, normal bowel sounds, no hepatosplenomegaly Extremities: no clubbing, cyanosis or edema; 2+ radial, ulnar and brachial pulses bilaterally; 2+ right femoral, posterior tibial and dorsalis pedis pulses; 2+ left femoral, posterior tibial and dorsalis pedis pulses; no subclavian or femoral bruits Neurological: grossly nonfocal Psych: Normal mood and affect     Wt Readings from Last 3 Encounters:  07/03/24 138 lb (62.6 kg)  06/27/24 139 lb 6.4 oz (63.2 kg)  05/14/24 142 lb 6 oz (64.6 kg)     Studies/Labs Reviewed:   EKG:    EKG Interpretation Date/Time:  Wednesday July 03 2024  12:01:59 EST Ventricular Rate:  72 PR Interval:  180 QRS Duration:  74 QT Interval:  408 QTC Calculation: 446 R Axis:   2  Text Interpretation: Normal sinus rhythm Normal ECG When compared with ECG of 11-Oct-2023 11:58, Premature ventricular complexes are no longer Present Confirmed by Anjel Perfetti [phone]) on 07/03/2024 12:16:11 PM        ECG at her previous appointment shows supraventricular tachycardia with frequent PVCs (2 morphologies are seen).  Retrograde P waves are seen following the narrow complex QRS, suggesting junctional tachycardia or uncommon AV node reentry.  There may be ST segment depression in the inferolateral leads, but it is hard to say because of the large negative P waves overall at the ST segment.  Recent Labs:  03/19/2020 (Dr. Tommas) hemoglobin A1c of 6.6%, fasting glucose 109  mild residual hypertriglyceridemia (even this is improved at 164).  HDL 49, LDL 55, total cholesterol 861.  04/08/2022 Cholesterol 185, HDL 65, LDL 99, triglycerides 105 Hemoglobin A1c 6.0% Hemoglobin 12.6, creatinine 0.95, potassium 5.0, ALT 13, TSH 1.15  02/27/2023 Cholesterol 205, HDL 49, LDL 76, triglycerides 511 Hemoglobin A1c 6.6% Creatinine 0.8, Potassium 4.8, ALT 19, TSH 1.12   Lipid Panel    Component Value Date/Time   CHOL 177 04/11/2024 0933   CHOL 205 (H) 02/27/2023 0943   TRIG 218.0 (H) 04/11/2024 0933   HDL 56.40 04/11/2024 0933   HDL 49 02/27/2023 0943   CHOLHDL 3 04/11/2024 0933   VLDL 43.6 (H) 04/11/2024 0933   LDLCALC 77 04/11/2024 0933   LDLCALC 76 02/27/2023 0943   LDLDIRECT 70.0 02/09/2018 1033    ASSESSMENT:    1. SVT (supraventricular tachycardia)   2. Primary hypertension   3. Mixed hyperlipidemia      PLAN:  In order of problems listed above:  SVT: No clinical recurrence short R-P tachycardia. Very prominent negative P waves well beyond the QRS. This could represent junctional tachycardia which would be unusual at her age or slow  slow AVNRT.  Vagal maneuvers and carotid sinus compression had no impact in the office.  She was on mildly symptomatic and the arrhythmia resolved with an extra dose of beta-blocker.  We did not  see any recurrence on a 2-week arrhythmia monitor and she has not seen tachycardia on her Fitbit. PVCs: When she wore the monitor, these were responsible for her palpitations. Syncope: In her lifetime so far has had 2 episodes of syncope and at least 2 other episodes of near syncope that all had characteristic suggestive of neurally mediated/vasovagal events. Elevated BP: Situational.  Normalized when we rechecked a few minutes later. HLP: Triglycerides are higher than usual (500 versus for her usual 200) so I wonder whether she was truly fasting when the blood was drawn.  Otherwise, cholesterol parameters are in target range.  Continue rosuvastatin . DM: Very good glycemic control.  This cannot be incremented as a major cause of her hypertriglyceridemia. OSA: Being reevaluated by Dr. Harden Staff.  If it is confirmed I think she is a good candidate for an inspire device since she is lean Empty sella sd.: Dr. Tommas is her endocrinologist.    Medication Adjustments/Labs and Tests Ordered: Current medicines are reviewed at length with the patient today.  Concerns regarding medicines are outlined above.  Medication changes, Labs and Tests ordered today are listed in the Patient Instructions below. There are no Patient Instructions on file for this visit.     Signed, Jerel Balding, MD  07/03/2024 12:16 PM    [facility] [address] [place], [place], [place]  [address] Phone: [phone]; Fax: [phone]

## 2024-07-08 ENCOUNTER — Encounter: Payer: Self-pay | Admitting: Cardiovascular Disease

## 2024-07-13 ENCOUNTER — Encounter: Payer: Self-pay | Admitting: Cardiovascular Disease

## 2024-07-22 ENCOUNTER — Ambulatory Visit: Payer: Self-pay | Admitting: Cardiovascular Disease

## 2024-07-22 DIAGNOSIS — I493 Ventricular premature depolarization: Secondary | ICD-10-CM | POA: Diagnosis not present

## 2024-07-22 DIAGNOSIS — R001 Bradycardia, unspecified: Secondary | ICD-10-CM | POA: Diagnosis not present

## 2024-08-20 ENCOUNTER — Other Ambulatory Visit: Payer: Self-pay | Admitting: Family Medicine

## 2024-08-20 NOTE — Telephone Encounter (Signed)
 Last office visit 06/27/24 for hosptial follow up.  Last refilled 05/11/2024 for #90 with 1 refill. Next appt: No future appointments with PCP.

## 2024-08-23 ENCOUNTER — Ambulatory Visit: Payer: Self-pay | Admitting: Family Medicine

## 2024-08-23 ENCOUNTER — Ambulatory Visit: Admission: RE | Admit: 2024-08-23 | Source: Ambulatory Visit

## 2024-08-23 DIAGNOSIS — Z803 Family history of malignant neoplasm of breast: Secondary | ICD-10-CM

## 2024-08-23 MED ORDER — GADOPICLENOL 0.5 MMOL/ML IV SOLN
6.0000 mL | Freq: Once | INTRAVENOUS | Status: AC | PRN
  Administered 2024-08-23: 6 mL via INTRAVENOUS

## 2025-01-01 ENCOUNTER — Ambulatory Visit: Payer: Self-pay | Admitting: Cardiovascular Disease

## 2025-02-19 ENCOUNTER — Ambulatory Visit
